# Patient Record
Sex: Male | Born: 1935 | Race: White | Hispanic: No | Smoking: Never smoker
Health system: Southern US, Community
[De-identification: ages and names within clinical notes are randomized; demographics above are authoritative.]

## PROBLEM LIST (undated history)

## (undated) DIAGNOSIS — I639 Cerebral infarction, unspecified: Secondary | ICD-10-CM

## (undated) DIAGNOSIS — E7800 Pure hypercholesterolemia, unspecified: Secondary | ICD-10-CM

## (undated) DIAGNOSIS — I4891 Unspecified atrial fibrillation: Secondary | ICD-10-CM

## (undated) DIAGNOSIS — K219 Gastro-esophageal reflux disease without esophagitis: Secondary | ICD-10-CM

## (undated) HISTORY — PX: TOTAL HIP ARTHROPLASTY: SHX124

---

## 2004-05-31 ENCOUNTER — Ambulatory Visit: Payer: Self-pay | Admitting: General Surgery

## 2006-01-12 ENCOUNTER — Ambulatory Visit: Payer: Self-pay | Admitting: Internal Medicine

## 2009-04-14 ENCOUNTER — Ambulatory Visit: Payer: Self-pay | Admitting: Internal Medicine

## 2009-04-15 ENCOUNTER — Ambulatory Visit: Payer: Self-pay | Admitting: Vascular Surgery

## 2009-04-21 ENCOUNTER — Ambulatory Visit: Payer: Self-pay | Admitting: Vascular Surgery

## 2009-04-22 ENCOUNTER — Ambulatory Visit: Payer: Self-pay | Admitting: Cardiovascular Disease

## 2009-04-22 ENCOUNTER — Inpatient Hospital Stay: Payer: Self-pay | Admitting: Vascular Surgery

## 2010-01-30 ENCOUNTER — Inpatient Hospital Stay: Payer: Self-pay | Admitting: Internal Medicine

## 2011-09-04 DIAGNOSIS — S32009A Unspecified fracture of unspecified lumbar vertebra, initial encounter for closed fracture: Secondary | ICD-10-CM | POA: Diagnosis not present

## 2011-09-04 DIAGNOSIS — M76899 Other specified enthesopathies of unspecified lower limb, excluding foot: Secondary | ICD-10-CM | POA: Diagnosis not present

## 2011-09-04 DIAGNOSIS — M1610 Unilateral primary osteoarthritis, unspecified hip: Secondary | ICD-10-CM | POA: Diagnosis not present

## 2011-09-13 DIAGNOSIS — Z125 Encounter for screening for malignant neoplasm of prostate: Secondary | ICD-10-CM | POA: Diagnosis not present

## 2011-09-13 DIAGNOSIS — E785 Hyperlipidemia, unspecified: Secondary | ICD-10-CM | POA: Diagnosis not present

## 2011-09-13 DIAGNOSIS — N400 Enlarged prostate without lower urinary tract symptoms: Secondary | ICD-10-CM | POA: Diagnosis not present

## 2011-09-27 DIAGNOSIS — S32009A Unspecified fracture of unspecified lumbar vertebra, initial encounter for closed fracture: Secondary | ICD-10-CM | POA: Diagnosis not present

## 2011-09-27 DIAGNOSIS — M1610 Unilateral primary osteoarthritis, unspecified hip: Secondary | ICD-10-CM | POA: Diagnosis not present

## 2011-09-27 DIAGNOSIS — M76899 Other specified enthesopathies of unspecified lower limb, excluding foot: Secondary | ICD-10-CM | POA: Diagnosis not present

## 2011-10-26 DIAGNOSIS — M1610 Unilateral primary osteoarthritis, unspecified hip: Secondary | ICD-10-CM | POA: Diagnosis not present

## 2011-10-26 DIAGNOSIS — M76899 Other specified enthesopathies of unspecified lower limb, excluding foot: Secondary | ICD-10-CM | POA: Diagnosis not present

## 2011-10-26 DIAGNOSIS — S32009A Unspecified fracture of unspecified lumbar vertebra, initial encounter for closed fracture: Secondary | ICD-10-CM | POA: Diagnosis not present

## 2012-03-29 DIAGNOSIS — E785 Hyperlipidemia, unspecified: Secondary | ICD-10-CM | POA: Diagnosis not present

## 2012-03-29 DIAGNOSIS — I4891 Unspecified atrial fibrillation: Secondary | ICD-10-CM | POA: Diagnosis not present

## 2012-04-03 DIAGNOSIS — M1990 Unspecified osteoarthritis, unspecified site: Secondary | ICD-10-CM | POA: Diagnosis not present

## 2012-04-03 DIAGNOSIS — E785 Hyperlipidemia, unspecified: Secondary | ICD-10-CM | POA: Diagnosis not present

## 2012-04-03 DIAGNOSIS — I739 Peripheral vascular disease, unspecified: Secondary | ICD-10-CM | POA: Diagnosis not present

## 2012-05-03 DIAGNOSIS — T797XXA Traumatic subcutaneous emphysema, initial encounter: Secondary | ICD-10-CM | POA: Diagnosis not present

## 2012-05-03 DIAGNOSIS — S2249XA Multiple fractures of ribs, unspecified side, initial encounter for closed fracture: Secondary | ICD-10-CM | POA: Diagnosis not present

## 2012-05-03 DIAGNOSIS — I609 Nontraumatic subarachnoid hemorrhage, unspecified: Secondary | ICD-10-CM | POA: Diagnosis not present

## 2012-05-03 DIAGNOSIS — S270XXA Traumatic pneumothorax, initial encounter: Secondary | ICD-10-CM | POA: Diagnosis not present

## 2012-05-03 DIAGNOSIS — S43109A Unspecified dislocation of unspecified acromioclavicular joint, initial encounter: Secondary | ICD-10-CM | POA: Diagnosis not present

## 2012-05-03 DIAGNOSIS — S52009A Unspecified fracture of upper end of unspecified ulna, initial encounter for closed fracture: Secondary | ICD-10-CM | POA: Diagnosis not present

## 2012-05-03 DIAGNOSIS — M546 Pain in thoracic spine: Secondary | ICD-10-CM | POA: Diagnosis not present

## 2012-05-03 DIAGNOSIS — I509 Heart failure, unspecified: Secondary | ICD-10-CM | POA: Diagnosis not present

## 2012-05-03 DIAGNOSIS — S2239XA Fracture of one rib, unspecified side, initial encounter for closed fracture: Secondary | ICD-10-CM | POA: Diagnosis not present

## 2012-05-03 DIAGNOSIS — S99929A Unspecified injury of unspecified foot, initial encounter: Secondary | ICD-10-CM | POA: Diagnosis not present

## 2012-05-03 DIAGNOSIS — J9819 Other pulmonary collapse: Secondary | ICD-10-CM | POA: Diagnosis not present

## 2012-05-03 DIAGNOSIS — I495 Sick sinus syndrome: Secondary | ICD-10-CM | POA: Diagnosis not present

## 2012-05-03 DIAGNOSIS — H9190 Unspecified hearing loss, unspecified ear: Secondary | ICD-10-CM | POA: Diagnosis present

## 2012-05-03 DIAGNOSIS — S4980XA Other specified injuries of shoulder and upper arm, unspecified arm, initial encounter: Secondary | ICD-10-CM | POA: Diagnosis not present

## 2012-05-03 DIAGNOSIS — Z7982 Long term (current) use of aspirin: Secondary | ICD-10-CM | POA: Diagnosis not present

## 2012-05-03 DIAGNOSIS — S42309A Unspecified fracture of shaft of humerus, unspecified arm, initial encounter for closed fracture: Secondary | ICD-10-CM | POA: Diagnosis not present

## 2012-05-03 DIAGNOSIS — Z4682 Encounter for fitting and adjustment of non-vascular catheter: Secondary | ICD-10-CM | POA: Diagnosis not present

## 2012-05-03 DIAGNOSIS — Z79899 Other long term (current) drug therapy: Secondary | ICD-10-CM | POA: Diagnosis not present

## 2012-05-03 DIAGNOSIS — H918X9 Other specified hearing loss, unspecified ear: Secondary | ICD-10-CM | POA: Diagnosis not present

## 2012-05-03 DIAGNOSIS — IMO0002 Reserved for concepts with insufficient information to code with codable children: Secondary | ICD-10-CM | POA: Diagnosis not present

## 2012-05-03 DIAGNOSIS — H7200 Central perforation of tympanic membrane, unspecified ear: Secondary | ICD-10-CM | POA: Diagnosis not present

## 2012-05-03 DIAGNOSIS — E7800 Pure hypercholesterolemia, unspecified: Secondary | ICD-10-CM | POA: Diagnosis present

## 2012-05-03 DIAGNOSIS — S298XXA Other specified injuries of thorax, initial encounter: Secondary | ICD-10-CM | POA: Diagnosis not present

## 2012-05-03 DIAGNOSIS — S066X9A Traumatic subarachnoid hemorrhage with loss of consciousness of unspecified duration, initial encounter: Secondary | ICD-10-CM | POA: Diagnosis present

## 2012-05-03 DIAGNOSIS — E785 Hyperlipidemia, unspecified: Secondary | ICD-10-CM | POA: Diagnosis present

## 2012-05-03 DIAGNOSIS — H748X9 Other specified disorders of middle ear and mastoid, unspecified ear: Secondary | ICD-10-CM | POA: Diagnosis present

## 2012-05-03 DIAGNOSIS — G8911 Acute pain due to trauma: Secondary | ICD-10-CM | POA: Diagnosis present

## 2012-05-03 DIAGNOSIS — S0003XA Contusion of scalp, initial encounter: Secondary | ICD-10-CM | POA: Diagnosis not present

## 2012-05-03 DIAGNOSIS — R918 Other nonspecific abnormal finding of lung field: Secondary | ICD-10-CM | POA: Diagnosis not present

## 2012-05-03 DIAGNOSIS — J9383 Other pneumothorax: Secondary | ICD-10-CM | POA: Diagnosis not present

## 2012-05-03 DIAGNOSIS — H902 Conductive hearing loss, unspecified: Secondary | ICD-10-CM | POA: Diagnosis not present

## 2012-05-03 DIAGNOSIS — Z96659 Presence of unspecified artificial knee joint: Secondary | ICD-10-CM | POA: Diagnosis not present

## 2012-05-03 DIAGNOSIS — Z96649 Presence of unspecified artificial hip joint: Secondary | ICD-10-CM | POA: Diagnosis not present

## 2012-05-03 DIAGNOSIS — T148XXA Other injury of unspecified body region, initial encounter: Secondary | ICD-10-CM | POA: Diagnosis not present

## 2012-05-03 DIAGNOSIS — S01309A Unspecified open wound of unspecified ear, initial encounter: Secondary | ICD-10-CM | POA: Diagnosis present

## 2012-05-10 DIAGNOSIS — R071 Chest pain on breathing: Secondary | ICD-10-CM | POA: Diagnosis not present

## 2012-05-10 DIAGNOSIS — J95811 Postprocedural pneumothorax: Secondary | ICD-10-CM | POA: Diagnosis not present

## 2012-05-10 DIAGNOSIS — I609 Nontraumatic subarachnoid hemorrhage, unspecified: Secondary | ICD-10-CM | POA: Diagnosis not present

## 2012-05-13 DIAGNOSIS — H7290 Unspecified perforation of tympanic membrane, unspecified ear: Secondary | ICD-10-CM | POA: Diagnosis not present

## 2012-05-13 DIAGNOSIS — H902 Conductive hearing loss, unspecified: Secondary | ICD-10-CM | POA: Diagnosis not present

## 2012-05-13 DIAGNOSIS — H905 Unspecified sensorineural hearing loss: Secondary | ICD-10-CM | POA: Diagnosis not present

## 2012-05-13 DIAGNOSIS — H6120 Impacted cerumen, unspecified ear: Secondary | ICD-10-CM | POA: Diagnosis not present

## 2012-05-13 DIAGNOSIS — H66009 Acute suppurative otitis media without spontaneous rupture of ear drum, unspecified ear: Secondary | ICD-10-CM | POA: Diagnosis not present

## 2012-05-16 DIAGNOSIS — G459 Transient cerebral ischemic attack, unspecified: Secondary | ICD-10-CM | POA: Diagnosis not present

## 2012-05-16 DIAGNOSIS — E785 Hyperlipidemia, unspecified: Secondary | ICD-10-CM | POA: Diagnosis not present

## 2012-05-16 DIAGNOSIS — I6350 Cerebral infarction due to unspecified occlusion or stenosis of unspecified cerebral artery: Secondary | ICD-10-CM | POA: Diagnosis not present

## 2012-05-16 DIAGNOSIS — T798XXA Other early complications of trauma, initial encounter: Secondary | ICD-10-CM | POA: Diagnosis not present

## 2012-05-16 DIAGNOSIS — M25519 Pain in unspecified shoulder: Secondary | ICD-10-CM | POA: Diagnosis not present

## 2012-05-16 LAB — CK TOTAL AND CKMB (NOT AT ARMC)
CK, Total: 92 U/L (ref 35–232)
CK-MB: 2.7 ng/mL (ref 0.5–3.6)

## 2012-05-16 LAB — COMPREHENSIVE METABOLIC PANEL
Alkaline Phosphatase: 126 U/L (ref 50–136)
BUN: 16 mg/dL (ref 7–18)
Bilirubin,Total: 0.6 mg/dL (ref 0.2–1.0)
Chloride: 106 mmol/L (ref 98–107)
Co2: 23 mmol/L (ref 21–32)
Creatinine: 0.94 mg/dL (ref 0.60–1.30)
Osmolality: 283 (ref 275–301)
SGOT(AST): 47 U/L — ABNORMAL HIGH (ref 15–37)
SGPT (ALT): 88 U/L — ABNORMAL HIGH (ref 12–78)

## 2012-05-16 LAB — URINALYSIS, COMPLETE
Bacteria: NONE SEEN
Bilirubin,UR: NEGATIVE
Blood: NEGATIVE
Glucose,UR: NEGATIVE mg/dL (ref 0–75)
Ketone: NEGATIVE
Nitrite: NEGATIVE
Ph: 7 (ref 4.5–8.0)
Protein: NEGATIVE
Specific Gravity: 1.005 (ref 1.003–1.030)
Squamous Epithelial: <1

## 2012-05-16 LAB — CBC
HCT: 39.3 % — ABNORMAL LOW (ref 40.0–52.0)
HGB: 13.4 g/dL (ref 13.0–18.0)
MCV: 90 fL (ref 80–100)
RDW: 13.5 % (ref 11.5–14.5)
WBC: 7.5 10*3/uL (ref 3.8–10.6)

## 2012-05-16 LAB — TROPONIN I: Troponin-I: <0.02 ng/mL

## 2012-05-17 ENCOUNTER — Inpatient Hospital Stay: Payer: Self-pay | Admitting: Internal Medicine

## 2012-05-17 DIAGNOSIS — Z7902 Long term (current) use of antithrombotics/antiplatelets: Secondary | ICD-10-CM | POA: Diagnosis not present

## 2012-05-17 DIAGNOSIS — Z1389 Encounter for screening for other disorder: Secondary | ICD-10-CM | POA: Diagnosis not present

## 2012-05-17 DIAGNOSIS — G459 Transient cerebral ischemic attack, unspecified: Secondary | ICD-10-CM | POA: Diagnosis not present

## 2012-05-17 DIAGNOSIS — S060X0A Concussion without loss of consciousness, initial encounter: Secondary | ICD-10-CM | POA: Diagnosis not present

## 2012-05-17 DIAGNOSIS — T798XXA Other early complications of trauma, initial encounter: Secondary | ICD-10-CM | POA: Diagnosis not present

## 2012-05-17 DIAGNOSIS — M25519 Pain in unspecified shoulder: Secondary | ICD-10-CM | POA: Diagnosis not present

## 2012-05-17 DIAGNOSIS — I6350 Cerebral infarction due to unspecified occlusion or stenosis of unspecified cerebral artery: Secondary | ICD-10-CM | POA: Diagnosis not present

## 2012-05-17 DIAGNOSIS — I70209 Unspecified atherosclerosis of native arteries of extremities, unspecified extremity: Secondary | ICD-10-CM | POA: Diagnosis present

## 2012-05-17 DIAGNOSIS — E785 Hyperlipidemia, unspecified: Secondary | ICD-10-CM | POA: Diagnosis not present

## 2012-05-17 DIAGNOSIS — Z7982 Long term (current) use of aspirin: Secondary | ICD-10-CM | POA: Diagnosis not present

## 2012-05-17 DIAGNOSIS — Z87820 Personal history of traumatic brain injury: Secondary | ICD-10-CM | POA: Diagnosis not present

## 2012-05-17 DIAGNOSIS — R4789 Other speech disturbances: Secondary | ICD-10-CM | POA: Diagnosis present

## 2012-05-17 DIAGNOSIS — Z8249 Family history of ischemic heart disease and other diseases of the circulatory system: Secondary | ICD-10-CM | POA: Diagnosis not present

## 2012-05-17 DIAGNOSIS — I6529 Occlusion and stenosis of unspecified carotid artery: Secondary | ICD-10-CM | POA: Diagnosis not present

## 2012-05-17 DIAGNOSIS — I6340 Cerebral infarction due to embolism of unspecified cerebral artery: Secondary | ICD-10-CM | POA: Diagnosis not present

## 2012-05-17 DIAGNOSIS — I2510 Atherosclerotic heart disease of native coronary artery without angina pectoris: Secondary | ICD-10-CM | POA: Diagnosis not present

## 2012-05-20 DIAGNOSIS — I6789 Other cerebrovascular disease: Secondary | ICD-10-CM | POA: Diagnosis not present

## 2012-05-20 DIAGNOSIS — G459 Transient cerebral ischemic attack, unspecified: Secondary | ICD-10-CM | POA: Diagnosis not present

## 2012-05-23 DIAGNOSIS — Z8673 Personal history of transient ischemic attack (TIA), and cerebral infarction without residual deficits: Secondary | ICD-10-CM | POA: Diagnosis not present

## 2012-05-23 DIAGNOSIS — H9190 Unspecified hearing loss, unspecified ear: Secondary | ICD-10-CM | POA: Diagnosis not present

## 2012-05-27 DIAGNOSIS — H908 Mixed conductive and sensorineural hearing loss, unspecified: Secondary | ICD-10-CM | POA: Diagnosis not present

## 2012-05-27 DIAGNOSIS — H6980 Other specified disorders of Eustachian tube, unspecified ear: Secondary | ICD-10-CM | POA: Diagnosis not present

## 2012-05-29 DIAGNOSIS — M719 Bursopathy, unspecified: Secondary | ICD-10-CM | POA: Diagnosis not present

## 2012-05-29 DIAGNOSIS — M75100 Unspecified rotator cuff tear or rupture of unspecified shoulder, not specified as traumatic: Secondary | ICD-10-CM | POA: Diagnosis not present

## 2012-05-29 DIAGNOSIS — M67919 Unspecified disorder of synovium and tendon, unspecified shoulder: Secondary | ICD-10-CM | POA: Diagnosis not present

## 2012-05-31 DIAGNOSIS — R4182 Altered mental status, unspecified: Secondary | ICD-10-CM | POA: Diagnosis not present

## 2012-05-31 DIAGNOSIS — R569 Unspecified convulsions: Secondary | ICD-10-CM | POA: Diagnosis not present

## 2012-06-10 DIAGNOSIS — F411 Generalized anxiety disorder: Secondary | ICD-10-CM | POA: Diagnosis not present

## 2012-06-10 DIAGNOSIS — G459 Transient cerebral ischemic attack, unspecified: Secondary | ICD-10-CM | POA: Diagnosis not present

## 2012-06-10 DIAGNOSIS — E785 Hyperlipidemia, unspecified: Secondary | ICD-10-CM | POA: Diagnosis not present

## 2012-06-17 DIAGNOSIS — H908 Mixed conductive and sensorineural hearing loss, unspecified: Secondary | ICD-10-CM | POA: Diagnosis not present

## 2012-06-17 DIAGNOSIS — H6980 Other specified disorders of Eustachian tube, unspecified ear: Secondary | ICD-10-CM | POA: Diagnosis not present

## 2012-06-17 DIAGNOSIS — H6500 Acute serous otitis media, unspecified ear: Secondary | ICD-10-CM | POA: Diagnosis not present

## 2012-06-26 DIAGNOSIS — H5316 Psychophysical visual disturbances: Secondary | ICD-10-CM | POA: Diagnosis not present

## 2012-06-26 DIAGNOSIS — F29 Unspecified psychosis not due to a substance or known physiological condition: Secondary | ICD-10-CM | POA: Diagnosis not present

## 2012-06-26 DIAGNOSIS — G4733 Obstructive sleep apnea (adult) (pediatric): Secondary | ICD-10-CM | POA: Diagnosis not present

## 2012-07-01 ENCOUNTER — Ambulatory Visit: Payer: Self-pay | Admitting: Neurology

## 2012-07-01 DIAGNOSIS — R4182 Altered mental status, unspecified: Secondary | ICD-10-CM | POA: Diagnosis not present

## 2012-07-01 DIAGNOSIS — Z8673 Personal history of transient ischemic attack (TIA), and cerebral infarction without residual deficits: Secondary | ICD-10-CM | POA: Diagnosis not present

## 2012-07-01 DIAGNOSIS — R569 Unspecified convulsions: Secondary | ICD-10-CM | POA: Diagnosis not present

## 2012-07-08 ENCOUNTER — Ambulatory Visit: Payer: Self-pay | Admitting: Neurology

## 2012-07-08 DIAGNOSIS — Z8673 Personal history of transient ischemic attack (TIA), and cerebral infarction without residual deficits: Secondary | ICD-10-CM | POA: Diagnosis not present

## 2012-07-08 DIAGNOSIS — R930 Abnormal findings on diagnostic imaging of skull and head, not elsewhere classified: Secondary | ICD-10-CM | POA: Diagnosis not present

## 2012-07-08 DIAGNOSIS — F29 Unspecified psychosis not due to a substance or known physiological condition: Secondary | ICD-10-CM | POA: Diagnosis not present

## 2012-07-08 LAB — CREATININE, SERUM
Creatinine: 0.94 mg/dL (ref 0.60–1.30)
EGFR (African American): >60
EGFR (Non-African Amer.): >60

## 2012-07-16 DIAGNOSIS — I619 Nontraumatic intracerebral hemorrhage, unspecified: Secondary | ICD-10-CM | POA: Diagnosis not present

## 2012-07-29 DIAGNOSIS — H6500 Acute serous otitis media, unspecified ear: Secondary | ICD-10-CM | POA: Diagnosis not present

## 2012-07-29 DIAGNOSIS — H908 Mixed conductive and sensorineural hearing loss, unspecified: Secondary | ICD-10-CM | POA: Diagnosis not present

## 2012-07-29 DIAGNOSIS — H6980 Other specified disorders of Eustachian tube, unspecified ear: Secondary | ICD-10-CM | POA: Diagnosis not present

## 2012-10-02 DIAGNOSIS — I6330 Cerebral infarction due to thrombosis of unspecified cerebral artery: Secondary | ICD-10-CM | POA: Diagnosis not present

## 2012-10-04 DIAGNOSIS — M1990 Unspecified osteoarthritis, unspecified site: Secondary | ICD-10-CM | POA: Diagnosis not present

## 2012-10-04 DIAGNOSIS — Z125 Encounter for screening for malignant neoplasm of prostate: Secondary | ICD-10-CM | POA: Diagnosis not present

## 2012-10-08 DIAGNOSIS — M1990 Unspecified osteoarthritis, unspecified site: Secondary | ICD-10-CM | POA: Diagnosis not present

## 2012-11-19 DIAGNOSIS — H2510 Age-related nuclear cataract, unspecified eye: Secondary | ICD-10-CM | POA: Diagnosis not present

## 2013-04-16 DIAGNOSIS — R7309 Other abnormal glucose: Secondary | ICD-10-CM | POA: Diagnosis not present

## 2013-04-16 DIAGNOSIS — E785 Hyperlipidemia, unspecified: Secondary | ICD-10-CM | POA: Diagnosis not present

## 2013-04-23 DIAGNOSIS — E785 Hyperlipidemia, unspecified: Secondary | ICD-10-CM | POA: Diagnosis not present

## 2013-04-23 DIAGNOSIS — I739 Peripheral vascular disease, unspecified: Secondary | ICD-10-CM | POA: Diagnosis not present

## 2013-04-23 DIAGNOSIS — M1990 Unspecified osteoarthritis, unspecified site: Secondary | ICD-10-CM | POA: Diagnosis not present

## 2013-06-27 ENCOUNTER — Emergency Department: Payer: Self-pay | Admitting: Emergency Medicine

## 2013-06-27 DIAGNOSIS — T7840XA Allergy, unspecified, initial encounter: Secondary | ICD-10-CM | POA: Diagnosis not present

## 2013-06-27 DIAGNOSIS — N400 Enlarged prostate without lower urinary tract symptoms: Secondary | ICD-10-CM | POA: Diagnosis not present

## 2013-06-27 DIAGNOSIS — R197 Diarrhea, unspecified: Secondary | ICD-10-CM | POA: Diagnosis not present

## 2013-06-27 DIAGNOSIS — R9431 Abnormal electrocardiogram [ECG] [EKG]: Secondary | ICD-10-CM | POA: Diagnosis not present

## 2013-06-27 DIAGNOSIS — Z79899 Other long term (current) drug therapy: Secondary | ICD-10-CM | POA: Diagnosis not present

## 2013-06-27 LAB — CBC WITH DIFFERENTIAL/PLATELET
Basophil #: 0 10*3/uL (ref 0.0–0.1)
Basophil %: 0.2 %
Eosinophil #: 0.1 10*3/uL (ref 0.0–0.7)
Eosinophil %: 0.7 %
HCT: 42.8 % (ref 40.0–52.0)
Lymphocyte #: 1.4 10*3/uL (ref 1.0–3.6)
MCH: 31 pg (ref 26.0–34.0)
MCV: 91 fL (ref 80–100)
Monocyte #: 0.7 x10 3/mm (ref 0.2–1.0)
Monocyte %: 6 %
Neutrophil #: 9.3 10*3/uL — ABNORMAL HIGH (ref 1.4–6.5)
Neutrophil %: 80.6 %
RBC: 4.71 10*6/uL (ref 4.40–5.90)
RDW: 14 % (ref 11.5–14.5)

## 2013-06-27 LAB — COMPREHENSIVE METABOLIC PANEL
Alkaline Phosphatase: 90 U/L (ref 50–136)
Anion Gap: 6 — ABNORMAL LOW (ref 7–16)
BUN: 29 mg/dL — ABNORMAL HIGH (ref 7–18)
Calcium, Total: 9 mg/dL (ref 8.5–10.1)
Chloride: 108 mmol/L — ABNORMAL HIGH (ref 98–107)
Co2: 26 mmol/L (ref 21–32)
Creatinine: 1.35 mg/dL — ABNORMAL HIGH (ref 0.60–1.30)
EGFR (African American): 59 — ABNORMAL LOW
EGFR (Non-African Amer.): 51 — ABNORMAL LOW
Potassium: 3.9 mmol/L (ref 3.5–5.1)
SGOT(AST): 29 U/L (ref 15–37)
Sodium: 140 mmol/L (ref 136–145)
Total Protein: 7.4 g/dL (ref 6.4–8.2)

## 2013-06-27 LAB — TROPONIN I: Troponin-I: <0.02 ng/mL

## 2013-09-12 DIAGNOSIS — M75100 Unspecified rotator cuff tear or rupture of unspecified shoulder, not specified as traumatic: Secondary | ICD-10-CM | POA: Diagnosis not present

## 2013-09-12 DIAGNOSIS — IMO0002 Reserved for concepts with insufficient information to code with codable children: Secondary | ICD-10-CM | POA: Diagnosis not present

## 2013-09-12 DIAGNOSIS — M67919 Unspecified disorder of synovium and tendon, unspecified shoulder: Secondary | ICD-10-CM | POA: Diagnosis not present

## 2013-09-12 DIAGNOSIS — M719 Bursopathy, unspecified: Secondary | ICD-10-CM | POA: Diagnosis not present

## 2013-10-22 DIAGNOSIS — E785 Hyperlipidemia, unspecified: Secondary | ICD-10-CM | POA: Diagnosis not present

## 2013-10-29 DIAGNOSIS — M1990 Unspecified osteoarthritis, unspecified site: Secondary | ICD-10-CM | POA: Diagnosis not present

## 2013-10-29 DIAGNOSIS — I10 Essential (primary) hypertension: Secondary | ICD-10-CM | POA: Diagnosis not present

## 2014-01-09 DIAGNOSIS — M76899 Other specified enthesopathies of unspecified lower limb, excluding foot: Secondary | ICD-10-CM | POA: Diagnosis not present

## 2014-09-14 ENCOUNTER — Ambulatory Visit: Payer: Self-pay | Admitting: Family Medicine

## 2014-09-14 DIAGNOSIS — S20212A Contusion of left front wall of thorax, initial encounter: Secondary | ICD-10-CM | POA: Diagnosis not present

## 2014-09-14 DIAGNOSIS — Y999 Unspecified external cause status: Secondary | ICD-10-CM | POA: Diagnosis not present

## 2014-09-14 DIAGNOSIS — S299XXA Unspecified injury of thorax, initial encounter: Secondary | ICD-10-CM | POA: Diagnosis not present

## 2014-09-14 DIAGNOSIS — R0789 Other chest pain: Secondary | ICD-10-CM | POA: Diagnosis not present

## 2014-09-14 DIAGNOSIS — Y929 Unspecified place or not applicable: Secondary | ICD-10-CM | POA: Diagnosis not present

## 2014-09-14 DIAGNOSIS — Y939 Activity, unspecified: Secondary | ICD-10-CM | POA: Diagnosis not present

## 2014-09-22 ENCOUNTER — Ambulatory Visit: Payer: Self-pay | Admitting: Family Medicine

## 2014-09-22 DIAGNOSIS — S20212S Contusion of left front wall of thorax, sequela: Secondary | ICD-10-CM | POA: Diagnosis not present

## 2014-09-22 DIAGNOSIS — S2232XS Fracture of one rib, left side, sequela: Secondary | ICD-10-CM | POA: Diagnosis not present

## 2014-09-22 DIAGNOSIS — S299XXA Unspecified injury of thorax, initial encounter: Secondary | ICD-10-CM | POA: Diagnosis not present

## 2014-09-22 DIAGNOSIS — Z96641 Presence of right artificial hip joint: Secondary | ICD-10-CM | POA: Diagnosis not present

## 2014-09-22 DIAGNOSIS — R079 Chest pain, unspecified: Secondary | ICD-10-CM | POA: Diagnosis not present

## 2014-09-22 DIAGNOSIS — K219 Gastro-esophageal reflux disease without esophagitis: Secondary | ICD-10-CM | POA: Diagnosis not present

## 2014-09-22 DIAGNOSIS — S279XXS Injury of unspecified intrathoracic organ, sequela: Secondary | ICD-10-CM | POA: Diagnosis not present

## 2014-09-29 DIAGNOSIS — H2513 Age-related nuclear cataract, bilateral: Secondary | ICD-10-CM | POA: Diagnosis not present

## 2014-10-20 DIAGNOSIS — H43811 Vitreous degeneration, right eye: Secondary | ICD-10-CM | POA: Diagnosis not present

## 2014-11-24 NOTE — Discharge Summary (Signed)
PATIENT NAME:  [name], [name] MR#:  [id number] DATE OF BIRTH:  12/07/1933  DATE OF ADMISSION:  05/17/2012 DATE OF DISCHARGE:  05/18/2012  PRIMARY CARE PHYSICIAN: Dewaine Oatsenny Tate, MD  DISCHARGE DIAGNOSES:  1. Right frontal subacute stroke  2. Hyperlipidemia.  3. History of left carotid endarterectomy in 2010.   CONSULTANTS: Cristopher PeruHemang Shah, MD - Neurology.   IMAGING STUDIES: MRI of the brain showed subacute lacunar infarction within the right frontal lobe along with mastoiditis.   Ultrasound of the carotids showed no significant stenosis.   Echocardiogram is pending.   ADMITTING HISTORY AND PHYSICAL: Please see detailed history and physical dictated on 05/16/2012. In brief, this is a 81 year old Caucasian gentleman with history of peripheral vascular disease and left carotid artery stenosis who presented to the Emergency Room complaining of slurred or garbled speech for 5 to 7 minutes at lunch. The patient had stopped his aspirin and Plavix since he had a head bleed two weeks prior. CT scan of the head was normal. The patient was admitted for further work-up of CVA.   HOSPITAL COURSE: Slurred speech. The patient's slurred speech had resolved by the time he presented to the Emergency Room. He had a MRI of the brain which showed right frontal subacute lacunar infarction. Dr. Sherryll BurgerShah of neurology was consulted who suggested continuing 325 mg of aspirin at home along with a statin. The patient did not have any hypertension. He did not need any physical therapy or speech services during the hospital stay with his symptoms resolved. The patient's carotid Dopplers have showed no significant stenosis. Echocardiogram is pending at this time. After the patient's echocardiogram results, he will be discharged home if Echo is normal.   On the day of discharge, the patient's temperature is 98.6, pulse 66, blood pressure 118/69, and he is being discharged home with normal neurological examination in a fair condition.    DISCHARGE MEDICATIONS: 1. Aspirin 325 mg once a day. 2. Fish oil 1 tablet oral once a day.  3. Multivitamin 1 tablet oral once a day.  4. Lipitor 20 mg oral once a day. 5. Multivitamin 1 tablet oral once a day.   DISCHARGE INSTRUCTIONS: The patient is to follow up with his primary care physician, Dr. Dewaine Oatsenny Tate, in a week. Continue aspirin and statin as prescribed. The patient has been advised to keep a low threshold for any further symptoms of focal weakness, speech problems, and facial droop and is to call his doctor or return to the Emergency Room if these symptoms happen. He will be on a cardiac diet with activity as tolerated. This plan was discussed with the patient who has verbalized understanding and is okay with the plan.   TIME SPENT: Time spent today on this discharge dictation along with coordinating care and counseling of the patient was 40 minutes. ____________________________ Molinda BailiffSrikar R. Channell Quattrone, MD srs:slb D: 05/18/2012 09:10:22 ET T: 05/18/2012 10:25:52 ET JOB#: [id number]  cc: Wardell HeathSrikar R. Oree Hislop, MD, <Dictator> Jillene Bucksenny C. Arlana Pouchate, MD Orie FishermanSRIKAR R Corvette Orser MD ELECTRONICALLY SIGNED 06/03/2012 13:57

## 2014-11-24 NOTE — Consult Note (Signed)
Brief Consult Note: Diagnosis: transient speech impairment - right posterior frontal infract (at least 2, left sided cortical infracts and WM changes).   Patient was seen by consultant.   Consult note dictated.   Comments: 1) Symptomatically had a TIA but MRI is consistant with very small infract involving only one gyrus in right posterior frontal region. Will call it "minor stroke" - he was not aware of having previous strokes but imaging is consistant with previous 2 cortical infracts involving left hemisphere (left mid temporal and larger left fronto-parietal). - Per pt and family he was only taking ASA 81 mg before this stroke. - Agree with stroke w/up, telemetry, carotid US, ECHO, lipid panel,  - Avoid aggressive BP reduction in peri-stroke period, - flu vaccine - Pt is back to his baseline. - Can go up to ASA 325 mg po qday. On statin. 2) Recent fall - scalp hematoma, right tympanic membrane rupture with loss of hearing and bleeding  from right ear -  - Pt had amnesia of the event and has post traumatic headache. - Likely has concussion.  Will follow.  Electronic Signatures: Jolene ProvostShah, Micah Galeno Kalpeshkumar (MD)  (Signed 11-Oct-13 21:22)  Authored: Brief Consult Note   Last Updated: 11-Oct-13 21:22 by Jolene ProvostShah, Atiyana Welte Kalpeshkumar (MD)

## 2014-11-24 NOTE — H&P (Signed)
PATIENT NAME:  [name], [name] MR#:  [id number] DATE OF BIRTH:  01-09-1934  DATE OF ADMISSION:  05/16/2012  PRIMARY CARE PHYSICIAN: Dewaine Oatsenny Tate, MD   PRESENTING COMPLAINT: Slurred speech.  HISTORY OF PRESENT ILLNESS: Mr. [name] is a 81 year old Caucasian gentleman who has history of peripheral vascular disease, history of left carotid artery stenosis, status post left CEA several years ago, history of coronary artery disease per cardiac catheterization in 2011 that showed minimal vessel disease, comes to the Emergency Room after he was found to have slurred/garbling speech for about 5 to 7 minutes at lunch when he was having it with his wife this afternoon at [employer]. The patient's speech has been normalized. The family has been noticing this has been going on and off for the past few days. He has been off aspirin and Plavix since his head bleed two weeks ago after he had a fall. CT of the head today looks normal. There is no evidence of any bleed, and it looks overall okay. He is being admitted for further evaluation and possible transient ischemic attack.   PAST MEDICAL HISTORY: 1. History of transient ischemic attacks in the past.  2. Left carotid endarterectomy. The patient was on aspirin and Plavix and has been off for two weeks.  3. History of fall two weeks ago, subsequently ended up having, from what the family tells, likely a subdural hematoma with bleed from his right ear and right ear tympanic membrane rupture currently, who was [facility] and transferred to a tertiary care center around [place] and kept in the hospital for 3 to 4 days. Repeat CT today does not show any evidence of bleed and appears to be normal. The patient has been off aspirin and Plavix for the last two weeks.  4. Hyperlipidemia.  5. Right ear injury after a fall two weeks ago, now on p.o. Augmentin and ear drops.  6. History of mini stroke in the past.   PAST SURGICAL HISTORY:  1. Left  carotid endarterectomy in 2010.  2. Left hip repair in 2005.   FAMILY HISTORY: No history of heart disease. One of his brothers has heart problems.   SOCIAL HISTORY: No history of smoking or alcohol intake.   CURRENT MEDICATIONS:  1. Augmentin antibiotic for ear infection.  2. Multivitamin p.o. daily.  3. Lipitor 20 mg p.o. daily.  4. Fish oil 1 tablet daily.  5. Aspirin 81 mg, the patient takes 2 tablets.  6. Plavix 75 mg daily, however, aspirin and Plavix have been on hold.    REVIEW OF SYSTEMS: CONSTITUTIONAL: No fever, fatigue, weakness. EYES: No blurred or double vision. ENT: No tinnitus. He has some temporary hearing loss and ear pain in the right side. RESPIRATORY: No cough, wheeze, hemoptysis, chronic obstructive pulmonary disease or dyspnea. CARDIOVASCULAR: No chest pain, orthopnea or edema. GASTROINTESTINAL: No nausea, vomiting, diarrhea, abdominal pain. GENITOURINARY: No dysuria or hematuria. ENDOCRINE: No polyuria or nocturia. HEMATOLOGIC: No anemia or easy bruising. SKIN: No acne or rash.  MUSCULOSKELETAL: Positive for arthritis. NEUROLOGICAL:  No cerebrovascular accident or  transient ischemic attack. PSYCHIATRIC: No anxiety or depression. All other systems reviewed and negative.   PHYSICAL EXAMINATION:  GENERAL: The patient is awake, alert and oriented x3, not in acute distress.   VITAL SIGNS: Afebrile, pulse is 63, blood pressure is 173/81. Saturations are 97% on air.   HEENT: Atraumatic, normocephalic. Pupils are equal, round and reactive to light and accommodation. Extraocular movements are intact. Oral mucosa is  moist.   NECK: Supple. No JVD. No carotid bruit.   LUNGS: Clear to auscultation bilaterally. No rales, rhonchi, respiratory distress, or labored breathing.   CARDIOVASCULAR: Both the heart sounds are normal. Rate, rhythm regular. PMI not lateralized.  Good pedal pulses, good femoral pulses. No lower extremity edema.   ABDOMEN: Soft, benign, nontender. No  organomegaly. Positive bowel sounds.  NEUROLOGICAL:  Grossly intact cranial nerves II through XII.  No motor or sensory deficit. No facial droop. Speech appears normal. Good strength, 5 out of 5 in all four extremities. Reflexes are 1+ both upper and lower extremities. Plantars are downgoing.   PSYCHIATRIC: The patient is awake, alert, oriented x3.   LABORATORY, DIAGNOSTIC AND RADIOLOGICAL DATA: EKG: Sinus rhythm. Urinalysis negative for urinary tract infection. CT of the head without contrast shows no acute intracranial process.  Mastoid air cells CBC within normal limits. Comprehensive metabolic panel within normal limits except glucose of 171. SGOT 47, SGPT 88. Troponin is 0.02.   ASSESSMENT/PLAN: Mr. [name] is a 81 year old with history of hyperlipidemia and carotid artery stenosis, being admitted for:   Suspected transient ischemic attack: The patient started having slurred/ garbling speech which cleared after 5 to 7 minutes. He had been off aspirin and Plavix since his head injury about two weeks ago. CT head today looks okay, no bleed. Hence, we will begin aspirin 81 mg daily.will get  MRI of the brain History of atrial fibrillation in the past: EKG today shows sinus rhythm.   The patient was on aspirin and Plavix and has been off two weeks since a fall and head bleed.  He will resume be resumed baclk on asa.  Hyperlipidemia. On Lipitor.  Right ear injury after a fall two weeks ago: The patient is on Augmentin and ear drops once the family brings them from home.   The patient will be admitted under observation for now.  Further work-up according to the patient's clinical course. The hospital admission plan was discussed with the patient and family members.   TIME SPENT: 50 minutes.  ____________________________ Wylie Hail Allena Katz, MD sap:cbb D: 05/16/2012 19:19:38 ET [name]: 05/17/2012 06:32:16 ET JOB#: [id number] cc: Jillene Bucks. Arlana Pouch, MD Willow Ora MD ELECTRONICALLY SIGNED 05/17/2012 21:00

## 2014-11-24 NOTE — Consult Note (Signed)
PATIENT NAME:  [name], [name] MR#:  [id number] DATE OF BIRTH:  02-18-1934  DATE OF CONSULTATION:  05/17/2012  REFERRING PHYSICIAN:  Enedina Finner, MD CONSULTING PHYSICIAN:  Ziah Leandro K. Sherryll Burger, MD  PRIMARY CARE PHYSICIAN: Dewaine Oats, MD  REASON FOR CONSULTATION: Stroke.   HISTORY OF PRESENT ILLNESS: [name] is a 81 year old Caucasian gentleman who had an acute onset of speech difficulty lasting for 5 to 7 minutes on 05/16/2012 in the afternoon when he was at [employer] with his friend.   This is very unusual for him. He denied any symptomatic stroke history.   His symptoms resolved but he still came to the ER to get checked out. Initial CT scan of the head was negative for new ischemic lesion, but on his MRI of the brain he does have a stroke on his right posterior frontal lobe involving only one gyrus.    The patient at baseline takes a baby aspirin a day (the admission physician seems to have mentioned aspirin plus Plavix, but per the patient's friend he only takes a baby aspirin a day.   The patient also had a fall around two weeks ago or so where he had a scalp hematoma and he ruptured his right tympanic membrane and had bleeding from his right ear. He has lost hearing from that ear.   The patient also developed significant posttraumatic headache since then. He had amnesia of the fall and a few hours afterwards he was admitted to a hospital and had a CT scan of the head which did not show any intracranial hemorrhage.   He has a history of carotid endarterectomy done on the left side.   The patient was not aware of stroke, but he does have some old strokes on his MRI of the brain, on the left side.   PAST MEDICAL HISTORY:  1. History of stroke.  2. History of fall two weeks ago, as described above. 3. Hyperlipidemia.   PAST SURGICAL HISTORY:  1. Left carotid endarterectomy in 2010. 2. Left hip repair in 2005.   FAMILY HISTORY: Positive for heart problems in a brother. His parents  are deceased.   SOCIAL HISTORY: History is significant in that he does not smoke, does not drink alcohol.   MEDICATIONS: I reviewed his home medication list.   REVIEW OF SYSTEMS: Positive for headache and inability to hear in his right ear which is new since a fall.   He has speech impediment. He has a scalp hematoma which is resolving. The rest of the review of systems was asked, 10 systems, and was unremarkable, except for what is in the History of Present Illness.   PHYSICAL EXAMINATION:   VITAL SIGNS: Temperature 97.9, pulse 69, respiratory rate 18, blood pressure 145/76, and pulse oximetry 96%.   GENERAL: He is an elderly looking Caucasian gentleman, bald, sitting in a chair next to his friend. He was asleep when I went into the room.   History was provided by his male friend.   The patient was easily arousable. He had some blood residue in his right ear.   The patient has some scalp bruise.   He was wearing glasses.   LUNGS: Clear to auscultation.   HEART: S1 and S2 heart sounds.  NECK: Carotid exam did not reveal any bruit. He does have a CEA scar on the left side of the neck.   NEUROLOGIC: It was difficult due to his profound hearing loss and I had to practically shout in his left ear.  He was alert and oriented. He followed two-step commands. He had made some mistakes, but I believe it was because of his hearing.  He was very humorous, he cracked some jokes.   He seemed to have no neglect. His attention and concentration and memory seems to be appropriate.   On his cranial nerves, his pupils were equal, round, and reactive. Extraocular movements were intact. His visual fields seemed full. His face was symmetric. Tongue was midline. Facial sensations were intact. He has profound hearing loss, right more than left.   On his motor examination, he has normal tone and strength of five out of five. His deep tendon reflexes were symmetric. His sensation to touch was  normal.   His gait was okay.   ASSESSMENT AND PLAN:  1. Stroke. Symptom-wise he had and a "TIA", but he has positive DWI changes on his MRI of the brain.   This supports a hypothesis that resolution of symptoms does not necessarily mean no ischemia to the cortical tissue.   I explained to him the  pathophysiology and progression of stroke, that the rest of the brain sometimes can take over the function of the ischemic tissue. As well as I explained to him the phenomenon of penumbra and fluctuation of the symptoms, etc.  I also explained to him that DWI lesion does not necessarily always mean that this is dead tissue but sometimes due to reperfusion and the patient may regain function of that area where DWI lesion usually suggests cytotoxic edema.   Based on his MRI, it seems like he had spontaneous reperfusion.   The patient also seemed to have previous two big infarcts, one was in the left middle temporal lobe and other was a bigger infarct in his left frontoparietal region.   At baseline he was only taking aspirin 81 mg. He should be taking aspirin 325 mg.  (It was documented that patient takes aspirin and Plavix, but I confirmed multiple times with the patient and his male friend that what antiepileptic medication he takes at baseline.)   The patient should be evaluated for embolic source. He had an echocardiogram which was unremarkable. He had telemetry monitoring. As outpatient he can be considered for Holter monitor for paroxysmal atrial fibrillation.   The patient is already on statin. Avoid drastically reducing blood pressure in peristroke.   Avoid hypothermia and hypoglycemia and hyperglycemia which is known to increase the penumbra size.  Flu shot and multivitamin have been related to reducing secondary stroke frequency.   Deep vein thrombosis prophylaxis.   The patient seems to be eager to go home.   2. Postconcussion syndrome. The patient seems to have had a significant  head injury around two weeks ago. He denied any local soreness to his neck region suggestive of dissection.   But if he has new focal neurological symptoms, he should be evaluated for dissection.   The patient and his family are aware that they should call 911 if he has any focal neurological deficit.   The patient was explained on postconcussion syndrome and the reason for amnesia of the event soon after fall.   He still has some headache that he can take over-the-counter medications for and I can see him in followup and see if he might benefit from any other headache medications.   Feel free to contact me with any further questions. I will follow this patient with you in the hospital. ____________________________ Daurice Ovando K. Sherryll Burger, MD hks:slb D: 05/18/2012 11:29:00 ET T:  05/18/2012 11:47:33 ET JOB#: [id number]  cc: Prentis Langdon K. Sherryll BurgerShah, MD, <Dictator> Durene CalHEMANG K [facility] MD ELECTRONICALLY SIGNED 05/21/2012 7:05

## 2014-12-16 DIAGNOSIS — M9903 Segmental and somatic dysfunction of lumbar region: Secondary | ICD-10-CM | POA: Diagnosis not present

## 2014-12-16 DIAGNOSIS — M9901 Segmental and somatic dysfunction of cervical region: Secondary | ICD-10-CM | POA: Diagnosis not present

## 2014-12-16 DIAGNOSIS — M5136 Other intervertebral disc degeneration, lumbar region: Secondary | ICD-10-CM | POA: Diagnosis not present

## 2014-12-16 DIAGNOSIS — M5032 Other cervical disc degeneration, mid-cervical region: Secondary | ICD-10-CM | POA: Diagnosis not present

## 2014-12-16 DIAGNOSIS — M9902 Segmental and somatic dysfunction of thoracic region: Secondary | ICD-10-CM | POA: Diagnosis not present

## 2014-12-16 DIAGNOSIS — M5134 Other intervertebral disc degeneration, thoracic region: Secondary | ICD-10-CM | POA: Diagnosis not present

## 2014-12-17 DIAGNOSIS — M9901 Segmental and somatic dysfunction of cervical region: Secondary | ICD-10-CM | POA: Diagnosis not present

## 2014-12-17 DIAGNOSIS — M9902 Segmental and somatic dysfunction of thoracic region: Secondary | ICD-10-CM | POA: Diagnosis not present

## 2014-12-17 DIAGNOSIS — M5136 Other intervertebral disc degeneration, lumbar region: Secondary | ICD-10-CM | POA: Diagnosis not present

## 2014-12-17 DIAGNOSIS — M9903 Segmental and somatic dysfunction of lumbar region: Secondary | ICD-10-CM | POA: Diagnosis not present

## 2014-12-17 DIAGNOSIS — M5032 Other cervical disc degeneration, mid-cervical region: Secondary | ICD-10-CM | POA: Diagnosis not present

## 2014-12-17 DIAGNOSIS — M5134 Other intervertebral disc degeneration, thoracic region: Secondary | ICD-10-CM | POA: Diagnosis not present

## 2014-12-18 DIAGNOSIS — M9902 Segmental and somatic dysfunction of thoracic region: Secondary | ICD-10-CM | POA: Diagnosis not present

## 2014-12-18 DIAGNOSIS — M5134 Other intervertebral disc degeneration, thoracic region: Secondary | ICD-10-CM | POA: Diagnosis not present

## 2014-12-18 DIAGNOSIS — M5032 Other cervical disc degeneration, mid-cervical region: Secondary | ICD-10-CM | POA: Diagnosis not present

## 2014-12-18 DIAGNOSIS — M9903 Segmental and somatic dysfunction of lumbar region: Secondary | ICD-10-CM | POA: Diagnosis not present

## 2014-12-18 DIAGNOSIS — M5136 Other intervertebral disc degeneration, lumbar region: Secondary | ICD-10-CM | POA: Diagnosis not present

## 2014-12-18 DIAGNOSIS — M9901 Segmental and somatic dysfunction of cervical region: Secondary | ICD-10-CM | POA: Diagnosis not present

## 2014-12-21 DIAGNOSIS — M5032 Other cervical disc degeneration, mid-cervical region: Secondary | ICD-10-CM | POA: Diagnosis not present

## 2014-12-21 DIAGNOSIS — M5136 Other intervertebral disc degeneration, lumbar region: Secondary | ICD-10-CM | POA: Diagnosis not present

## 2014-12-21 DIAGNOSIS — M9902 Segmental and somatic dysfunction of thoracic region: Secondary | ICD-10-CM | POA: Diagnosis not present

## 2014-12-21 DIAGNOSIS — M9901 Segmental and somatic dysfunction of cervical region: Secondary | ICD-10-CM | POA: Diagnosis not present

## 2014-12-21 DIAGNOSIS — M5134 Other intervertebral disc degeneration, thoracic region: Secondary | ICD-10-CM | POA: Diagnosis not present

## 2014-12-21 DIAGNOSIS — M9903 Segmental and somatic dysfunction of lumbar region: Secondary | ICD-10-CM | POA: Diagnosis not present

## 2014-12-23 DIAGNOSIS — M9902 Segmental and somatic dysfunction of thoracic region: Secondary | ICD-10-CM | POA: Diagnosis not present

## 2014-12-23 DIAGNOSIS — M9903 Segmental and somatic dysfunction of lumbar region: Secondary | ICD-10-CM | POA: Diagnosis not present

## 2014-12-23 DIAGNOSIS — M9901 Segmental and somatic dysfunction of cervical region: Secondary | ICD-10-CM | POA: Diagnosis not present

## 2014-12-23 DIAGNOSIS — M5136 Other intervertebral disc degeneration, lumbar region: Secondary | ICD-10-CM | POA: Diagnosis not present

## 2014-12-23 DIAGNOSIS — M5134 Other intervertebral disc degeneration, thoracic region: Secondary | ICD-10-CM | POA: Diagnosis not present

## 2014-12-23 DIAGNOSIS — M5032 Other cervical disc degeneration, mid-cervical region: Secondary | ICD-10-CM | POA: Diagnosis not present

## 2014-12-25 DIAGNOSIS — M9902 Segmental and somatic dysfunction of thoracic region: Secondary | ICD-10-CM | POA: Diagnosis not present

## 2014-12-25 DIAGNOSIS — M5136 Other intervertebral disc degeneration, lumbar region: Secondary | ICD-10-CM | POA: Diagnosis not present

## 2014-12-25 DIAGNOSIS — M9903 Segmental and somatic dysfunction of lumbar region: Secondary | ICD-10-CM | POA: Diagnosis not present

## 2014-12-25 DIAGNOSIS — M5134 Other intervertebral disc degeneration, thoracic region: Secondary | ICD-10-CM | POA: Diagnosis not present

## 2014-12-25 DIAGNOSIS — M5032 Other cervical disc degeneration, mid-cervical region: Secondary | ICD-10-CM | POA: Diagnosis not present

## 2014-12-25 DIAGNOSIS — M9901 Segmental and somatic dysfunction of cervical region: Secondary | ICD-10-CM | POA: Diagnosis not present

## 2016-04-20 ENCOUNTER — Encounter: Payer: Self-pay | Admitting: Emergency Medicine

## 2016-04-20 ENCOUNTER — Emergency Department: Payer: Medicare Other

## 2016-04-20 ENCOUNTER — Observation Stay
Admission: EM | Admit: 2016-04-20 | Discharge: 2016-04-21 | Disposition: A | Discharge status: Home / Self Care | Payer: Medicare Other | Attending: Internal Medicine | Admitting: Internal Medicine

## 2016-04-20 ENCOUNTER — Ambulatory Visit (INDEPENDENT_AMBULATORY_CARE_PROVIDER_SITE_OTHER)
Admission: EM | Admit: 2016-04-20 | Discharge: 2016-04-20 | Disposition: A | Discharge status: Other Acute Inpatient Hospital | Payer: Medicare Other | Source: Home / Self Care

## 2016-04-20 DIAGNOSIS — R000 Tachycardia, unspecified: Secondary | ICD-10-CM

## 2016-04-20 DIAGNOSIS — Z7982 Long term (current) use of aspirin: Secondary | ICD-10-CM | POA: Insufficient documentation

## 2016-04-20 DIAGNOSIS — R531 Weakness: Secondary | ICD-10-CM | POA: Diagnosis not present

## 2016-04-20 DIAGNOSIS — N400 Enlarged prostate without lower urinary tract symptoms: Secondary | ICD-10-CM | POA: Insufficient documentation

## 2016-04-20 DIAGNOSIS — E785 Hyperlipidemia, unspecified: Secondary | ICD-10-CM | POA: Insufficient documentation

## 2016-04-20 DIAGNOSIS — I959 Hypotension, unspecified: Secondary | ICD-10-CM | POA: Insufficient documentation

## 2016-04-20 DIAGNOSIS — F419 Anxiety disorder, unspecified: Secondary | ICD-10-CM | POA: Insufficient documentation

## 2016-04-20 DIAGNOSIS — G9389 Other specified disorders of brain: Secondary | ICD-10-CM | POA: Diagnosis not present

## 2016-04-20 DIAGNOSIS — Z8673 Personal history of transient ischemic attack (TIA), and cerebral infarction without residual deficits: Secondary | ICD-10-CM | POA: Diagnosis not present

## 2016-04-20 DIAGNOSIS — R778 Other specified abnormalities of plasma proteins: Secondary | ICD-10-CM | POA: Diagnosis not present

## 2016-04-20 DIAGNOSIS — R7989 Other specified abnormal findings of blood chemistry: Secondary | ICD-10-CM | POA: Diagnosis not present

## 2016-04-20 DIAGNOSIS — E7800 Pure hypercholesterolemia, unspecified: Secondary | ICD-10-CM | POA: Diagnosis not present

## 2016-04-20 DIAGNOSIS — E876 Hypokalemia: Secondary | ICD-10-CM | POA: Insufficient documentation

## 2016-04-20 DIAGNOSIS — Z79899 Other long term (current) drug therapy: Secondary | ICD-10-CM | POA: Diagnosis not present

## 2016-04-20 DIAGNOSIS — R197 Diarrhea, unspecified: Secondary | ICD-10-CM

## 2016-04-20 DIAGNOSIS — I081 Rheumatic disorders of both mitral and tricuspid valves: Secondary | ICD-10-CM | POA: Insufficient documentation

## 2016-04-20 DIAGNOSIS — K219 Gastro-esophageal reflux disease without esophagitis: Secondary | ICD-10-CM | POA: Diagnosis not present

## 2016-04-20 DIAGNOSIS — H538 Other visual disturbances: Secondary | ICD-10-CM | POA: Diagnosis not present

## 2016-04-20 HISTORY — DX: Pure hypercholesterolemia, unspecified: E78.00

## 2016-04-20 HISTORY — DX: Gastro-esophageal reflux disease without esophagitis: K21.9

## 2016-04-20 HISTORY — DX: Cerebral infarction, unspecified: I63.9

## 2016-04-20 LAB — COMPREHENSIVE METABOLIC PANEL
ALBUMIN: 3.3 g/dL — AB (ref 3.5–5.0)
ALK PHOS: 71 U/L (ref 38–126)
ALT: 21 U/L (ref 17–63)
ANION GAP: 6 (ref 5–15)
AST: 28 U/L (ref 15–41)
BUN: 19 mg/dL (ref 6–20)
CO2: 25 mmol/L (ref 22–32)
Calcium: 8.3 mg/dL — ABNORMAL LOW (ref 8.9–10.3)
Chloride: 108 mmol/L (ref 101–111)
Creatinine, Ser: 0.97 mg/dL (ref 0.61–1.24)
GFR calc Af Amer: >60 mL/min (ref >60)
GFR calc non Af Amer: >60 mL/min (ref >60)
GLUCOSE: 108 mg/dL — AB (ref 65–99)
POTASSIUM: 3.1 mmol/L — AB (ref 3.5–5.1)
SODIUM: 139 mmol/L (ref 135–145)
Total Bilirubin: 0.4 mg/dL (ref 0.3–1.2)
Total Protein: 6.2 g/dL — ABNORMAL LOW (ref 6.5–8.1)

## 2016-04-20 LAB — CBC WITH DIFFERENTIAL/PLATELET
BASOS ABS: 0 10*3/uL (ref 0–0.1)
BASOS PCT: 0 %
EOS ABS: 0.1 10*3/uL (ref 0–0.7)
Eosinophils Relative: 3 %
HEMATOCRIT: 35.4 % — AB (ref 40.0–52.0)
HEMOGLOBIN: 12.3 g/dL — AB (ref 13.0–18.0)
Lymphocytes Relative: 25 %
Lymphs Abs: 1.4 10*3/uL (ref 1.0–3.6)
MCH: 30.2 pg (ref 26.0–34.0)
MCHC: 34.7 g/dL (ref 32.0–36.0)
MCV: 86.9 fL (ref 80.0–100.0)
MONOS PCT: 21 %
Monocytes Absolute: 1.2 10*3/uL — ABNORMAL HIGH (ref 0.2–1.0)
NEUTROS ABS: 2.8 10*3/uL (ref 1.4–6.5)
NEUTROS PCT: 51 %
Platelets: 196 10*3/uL (ref 150–440)
RBC: 4.07 MIL/uL — ABNORMAL LOW (ref 4.40–5.90)
RDW: 14.6 % — AB (ref 11.5–14.5)
WBC: 5.5 10*3/uL (ref 3.8–10.6)

## 2016-04-20 LAB — URINALYSIS COMPLETE WITH MICROSCOPIC (ARMC ONLY)
BACTERIA UA: NONE SEEN
Bilirubin Urine: NEGATIVE
Glucose, UA: NEGATIVE mg/dL
HGB URINE DIPSTICK: NEGATIVE
KETONES UR: NEGATIVE mg/dL
NITRITE: NEGATIVE
PH: 6 (ref 5.0–8.0)
PROTEIN: NEGATIVE mg/dL
RBC / HPF: NONE SEEN RBC/hpf (ref 0–5)
SPECIFIC GRAVITY, URINE: 1.012 (ref 1.005–1.030)

## 2016-04-20 LAB — TROPONIN I
TROPONIN I: 0.12 ng/mL — AB (ref <0.03)
Troponin I: 0.1 ng/mL (ref <0.03)
Troponin I: 0.12 ng/mL (ref <0.03)

## 2016-04-20 LAB — MAGNESIUM: Magnesium: 1.8 mg/dL (ref 1.7–2.4)

## 2016-04-20 MED ORDER — ASPIRIN EC 81 MG PO TBEC
81.0000 mg | DELAYED_RELEASE_TABLET | Freq: Every day | ORAL | Status: DC
  Filled 2016-04-20: qty 1

## 2016-04-20 MED ORDER — PANTOPRAZOLE SODIUM 40 MG PO TBEC
40.0000 mg | DELAYED_RELEASE_TABLET | Freq: Every day | ORAL | Status: DC
  Administered 2016-04-21: 40 mg via ORAL
  Filled 2016-04-20 (×2): qty 1

## 2016-04-20 MED ORDER — VITAMIN D 1000 UNITS PO TABS
1000.0000 [IU] | ORAL_TABLET | Freq: Every day | ORAL | Status: DC
  Administered 2016-04-21: 1000 [IU] via ORAL
  Filled 2016-04-20 (×2): qty 1

## 2016-04-20 MED ORDER — ASPIRIN 81 MG PO CHEW
324.0000 mg | CHEWABLE_TABLET | Freq: Once | ORAL | Status: AC
  Administered 2016-04-20: 324 mg via ORAL
  Filled 2016-04-20: qty 4

## 2016-04-20 MED ORDER — POTASSIUM CHLORIDE CRYS ER 20 MEQ PO TBCR
40.0000 meq | EXTENDED_RELEASE_TABLET | ORAL | Status: AC
  Administered 2016-04-20 – 2016-04-21 (×2): 40 meq via ORAL
  Filled 2016-04-20 (×2): qty 2

## 2016-04-20 MED ORDER — SODIUM CHLORIDE 0.9% FLUSH
3.0000 mL | Freq: Two times a day (BID) | INTRAVENOUS | Status: DC
  Administered 2016-04-20 – 2016-04-21 (×2): 3 mL via INTRAVENOUS

## 2016-04-20 MED ORDER — ENOXAPARIN SODIUM 40 MG/0.4ML ~~LOC~~ SOLN
40.0000 mg | SUBCUTANEOUS | Status: DC
  Administered 2016-04-20: 40 mg via SUBCUTANEOUS
  Filled 2016-04-20: qty 0.4

## 2016-04-20 MED ORDER — ATORVASTATIN CALCIUM 20 MG PO TABS
40.0000 mg | ORAL_TABLET | Freq: Every day | ORAL | Status: DC
Start: 2016-04-20 — End: 2016-04-21
  Administered 2016-04-20: 40 mg via ORAL
  Filled 2016-04-20 (×2): qty 2

## 2016-04-20 NOTE — H&P (Signed)
[facility] [place] at [facility]   PATIENT NAME: Gregory ElkGeorge Weichert    MR#:  [id number]  DATE OF BIRTH:  1934/01/17  DATE OF ADMISSION:  04/20/2016  PRIMARY CARE PHYSICIAN: PROVIDER NOT IN SYSTEM   REQUESTING/REFERRING PHYSICIAN: Minna AntisKevin Paduchowski MD  CHIEF COMPLAINT:   Chief Complaint  Patient presents with  . Hypotension  . Tachycardia    HISTORY OF PRESENT ILLNESS: [name]  is a 82 y.o. male with a known history of  GERD, hypercholesterolemia and stroke who earlier this morning was not feeling well he checked his blood pressure and he noticed his heart rate was in the 150s and blood pressure was in the 70s. He was feeling very weak and tired. So he called his daughter. When she arrived she notices blood pressure again was low 88/58 heart rate in the 130s. So she decided to take him to the urgent care. Patient by the time he arrived there blood pressure and heart rate were improved however they referred him to the ED here. Patient has been asymptomatic here however his troponin has gone up to 0.12. He denies any chest pain or palpitations no syncope.  PAST MEDICAL HISTORY:   Past Medical History:  Diagnosis Date  . GERD (gastroesophageal reflux disease)   . Hypercholesteremia   . Stroke [facility](HCC)     PAST SURGICAL HISTORY:  Past Surgical History:  Procedure Laterality Date  . TOTAL HIP ARTHROPLASTY Left     SOCIAL HISTORY:  Social History  Substance Use Topics  . Smoking status: Never Smoker  . Smokeless tobacco: Never Used  . Alcohol use No    FAMILY HISTORY:  Family History  Problem Relation Age of Onset  . Hypertension Father     DRUG ALLERGIES: No Known Allergies  REVIEW OF SYSTEMS:   CONSTITUTIONAL: No fever, fatigue or weakness.  EYES: No blurred or double vision.  EARS, NOSE, AND THROAT: No tinnitus or ear pain.  RESPIRATORY: No cough, shortness of breath, wheezing or hemoptysis.  CARDIOVASCULAR: No chest pain, orthopnea,  edema.  GASTROINTESTINAL: No nausea, vomiting, diarrhea or abdominal pain.  GENITOURINARY: No dysuria, hematuria.  ENDOCRINE: No polyuria, nocturia,  HEMATOLOGY: No anemia, easy bruising or bleeding SKIN: No rash or lesion. MUSCULOSKELETAL: No joint pain or arthritis.   NEUROLOGIC: No tingling, numbness, weakness.  PSYCHIATRY: No anxiety or depression.   MEDICATIONS AT HOME:  Prior to Admission medications   Medication Sig Start Date End Date Taking? Authorizing Provider  aspirin EC 81 MG tablet Take 81 mg by mouth daily.    Historical Provider, MD  atorvastatin (LIPITOR) 80 MG tablet Take 40 mg by mouth daily.    Historical Provider, MD  Cholecalciferol 1000 units TBDP Take 1 tablet by mouth daily.    Historical Provider, MD  omeprazole (PRILOSEC) 20 MG capsule Take 20 mg by mouth daily.    Historical Provider, MD  terazosin (HYTRIN) 5 MG capsule Take 5 mg by mouth at bedtime.    Historical Provider, MD      PHYSICAL EXAMINATION:   VITAL SIGNS: Blood pressure (!) 136/101, pulse (!) 59, temperature 97.8 F (36.6 C), temperature source Oral, resp. rate 17, height 5\' 11"  (1.803 m), weight 190 lb (86.2 kg), SpO2 98 %.  GENERAL:  82 y.o.-year-old patient lying in the bed with no acute distress.  EYES: Pupils equal, round, reactive to light and accommodation. No scleral icterus. Extraocular muscles intact.  HEENT: Head atraumatic, normocephalic. Oropharynx and nasopharynx clear.  NECK:  Supple, no jugular venous distention. No thyroid enlargement, no tenderness.  LUNGS: Normal breath sounds bilaterally, no wheezing, rales,rhonchi or crepitation. No use of accessory muscles of respiration.  CARDIOVASCULAR: S1, S2 normal. No murmurs, rubs, or gallops.  ABDOMEN: Soft, nontender, nondistended. Bowel sounds present. No organomegaly or mass.  EXTREMITIES: No pedal edema, cyanosis, or clubbing.  NEUROLOGIC: Cranial nerves II through XII are intact. Muscle strength 5/5 in all extremities.  Sensation intact. Gait not checked.  PSYCHIATRIC: The patient is alert and oriented x 3.  SKIN: No obvious rash, lesion, or ulcer.   LABORATORY PANEL:   CBC  Recent Labs Lab 04/20/16 1538  WBC 5.5  HGB 12.3*  HCT 35.4*  PLT 196  MCV 86.9  MCH 30.2  MCHC 34.7  RDW 14.6*  LYMPHSABS 1.4  MONOABS 1.2*  EOSABS 0.1  BASOSABS 0.0   ------------------------------------------------------------------------------------------------------------------  Chemistries   Recent Labs Lab 04/20/16 1538  NA 139  K 3.1*  CL 108  CO2 25  GLUCOSE 108*  BUN 19  CREATININE 0.97  CALCIUM 8.3*  AST 28  ALT 21  ALKPHOS 71  BILITOT 0.4   ------------------------------------------------------------------------------------------------------------------ estimated creatinine clearance is 65.8 mL/min (by C-G formula based on SCr of 0.97 mg/dL). ------------------------------------------------------------------------------------------------------------------ No results for input(s): TSH, T4TOTAL, T3FREE, THYROIDAB in the last 72 hours.  Invalid input(s): FREET3   Coagulation profile No results for input(s): INR, PROTIME in the last 168 hours. ------------------------------------------------------------------------------------------------------------------- No results for input(s): DDIMER in the last 72 hours. -------------------------------------------------------------------------------------------------------------------  Cardiac Enzymes  Recent Labs Lab 04/20/16 1538 04/20/16 1828  TROPONINI 0.10* 0.12*   ------------------------------------------------------------------------------------------------------------------ Invalid input(s): POCBNP  ---------------------------------------------------------------------------------------------------------------  Urinalysis    Component Value Date/Time   COLORURINE YELLOW (A) 04/20/2016 1538   APPEARANCEUR CLEAR (A) 04/20/2016 1538    APPEARANCEUR Clear 05/16/2012 1535   LABSPEC 1.012 04/20/2016 1538   LABSPEC 1.005 05/16/2012 1535   PHURINE 6.0 04/20/2016 1538   GLUCOSEU NEGATIVE 04/20/2016 1538   GLUCOSEU Negative 05/16/2012 1535   HGBUR NEGATIVE 04/20/2016 1538   BILIRUBINUR NEGATIVE 04/20/2016 1538   BILIRUBINUR Negative 05/16/2012 1535   KETONESUR NEGATIVE 04/20/2016 1538   PROTEINUR NEGATIVE 04/20/2016 1538   NITRITE NEGATIVE 04/20/2016 1538   LEUKOCYTESUR TRACE (A) 04/20/2016 1538   LEUKOCYTESUR Negative 05/16/2012 1535     RADIOLOGY: Ct Head Wo Contrast  Result Date: 04/20/2016 CLINICAL DATA:  Blurred vision in the left time EXAM: CT HEAD WITHOUT CONTRAST TECHNIQUE: Contiguous axial images were obtained from the base of the skull through the vertex without intravenous contrast. COMPARISON:  MR brain of 07/08/2014 and CT brain of 05/16/2012 FINDINGS: Brain: The ventricular system remains prominent, as are the cortical sulci, indicative of diffuse atrophy. Small left basal ganglial infarct is stable. There has been some progression of moderate to severe small vessel ischemic change throughout the periventricular white matter. No new hemorrhage, mass lesion, or acute infarction is seen. Encephalomalacia remains high in the left posterior parietal region near the vertex. Vascular: No vascular abnormality is seen on this unenhanced study. Skull: On bone window images no calvarial abnormality is seen. Sinuses/Orbits: The paranasal sinuses are pneumatized with minimal mucosal thickening in a few ethmoid air cells. The patient appears to have undergone a partial ethmoidectomy. Other: None IMPRESSION: 1. No acute intracranial abnormality. 2. Atrophy and moderate small vessel ischemic change with some progression of the small vessel ischemic change throughout the periventricular white matter. 3. Stable encephalomalacia in the left posterior parietal region. Electronically Signed   By: Lucienne Minks.D.  On: 04/20/2016 16:46     EKG: Orders placed or performed during the hospital encounter of 04/20/16  . ED EKG  . ED EKG    IMPRESSION AND PLAN: Patient is a 82 year old white male presenting with hypotension tachycardia  1. Hypotension tachycardia could be related to an arrhythmia. Such as A. fib with RVR At this point we will admit him under observation and monitor on telemetry Obtain echocardiogram of the heart I will ask cardiology to see may need Holter monitor for home Hold terazosin   2. Elevated troponin without chest pain could be related to demand ischemia from the hypotension tachycardia We'll trend his cardiac enzymes Continue aspirin therapy  3. History of CVA Continue aspirin  4. Hypokalemia Not on any medications that would causes potassium to be low Replace potassium check a magnesium  5. GERD Continue PPI  6. Miscellaneous Lovenox for DVT prophylaxis    All the records are reviewed and case discussed with ED provider. Management plans discussed with the patient, family and they are in agreement.  CODE STATUS:    Code Status Orders        Start     Ordered   04/20/16 1947  Full code  Continuous     04/20/16 1948    Code Status History    Date Active Date Inactive Code Status Order ID Comments User Context   This patient has a current code status but no historical code status.    Advance Directive Documentation   Flowsheet Row Most Recent Value  Type of Advance Directive  Living will  Pre-existing out of facility DNR order (yellow form or pink MOST form)  No data  "MOST" Form in Place?  No data       TOTAL TIME TAKING CARE OF THIS PATIENT: .    Auburn Bilberry M.D on 04/20/2016 at 7:53 PM  Between 7am to 6pm - Pager - [phone]  After 6pm go to [email or web address] - password [email or web address]  [facility]  Office  [phone]  CC: Primary care physician; PROVIDER NOT IN SYSTEM

## 2016-04-20 NOTE — ED Notes (Signed)
Pt presents to ED with his family. Pt's daughter states BP was in the 80's systolic, so they waited then rechecked. Pt's daughter reports increasing pulses with a HR up to 150. Pt's daughter states most recent BP and pulse from home machine was BP 88/58, with a pulse of 68. Pt is alert and oriented at this time. Pt states when this occurred he was feeling weak.

## 2016-04-20 NOTE — ED Triage Notes (Signed)
Pt presents to ED with his family. Pt's daughter states BP was in the 80's systolic, so they waited then rechecked. Pt's daughter reports increasing pulses with a HR up to 150. Pt's daughter states most recent BP and pulse from home machine was BP 88/58, with a pulse of 68. Pt is alert and oriented at this time.

## 2016-04-20 NOTE — ED Triage Notes (Signed)
Patient c/o having a low blood pressure reading this morning.  Patient reports having a fast heart rate this morning.  Patient c/o diarrhea that started yesterday done today.

## 2016-04-20 NOTE — ED Notes (Addendum)
Pt had black tarry stool when up to bathroom. MD Paduchowski informed. MD performed rectal exam. No blood seen in stool

## 2016-04-20 NOTE — ED Provider Notes (Addendum)
[facility] Emergency Department Provider Note  Time seen: 7:27 PM  I have reviewed the triage vital signs and the nursing notes.   HISTORY  Chief Complaint Hypotension and Tachycardia    HPI [name] is a 82 y.o. male with a past medical history of gastric reflux, hyperlipidemia, CVA, who presents the emergency department with an elevated heart rate, low blood pressure and not feeling well. According to the patient every morning he checks his heart rate and blood pressure. This morning when he checked his blood pressure was low around 100 systolic, he states he was not feeling well, felt sluggish and generalized weakness. He took his pulse and it was greater than 150. Patient states he repeated this several times with similar results. When his daughter got home at 1:30 PM she checked it and it was normal, and the patient stated at that time he was feeling back to normal. They went to urgent care for evaluation, and were referred to the emergency department.Upon arrival patient appears well, denies any complaints at this time.  Past Medical History:  Diagnosis Date  . GERD (gastroesophageal reflux disease)   . Hypercholesteremia   . Stroke [facility])     There are no active problems to display for this patient.   Past Surgical History:  Procedure Laterality Date  . TOTAL HIP ARTHROPLASTY Left     Prior to Admission medications   Medication Sig Start Date End Date Taking? Authorizing Provider  aspirin EC 81 MG tablet Take 81 mg by mouth daily.    Historical Provider, MD  atorvastatin (LIPITOR) 80 MG tablet Take 40 mg by mouth daily.    Historical Provider, MD  Cholecalciferol 1000 units TBDP Take 1 tablet by mouth daily.    Historical Provider, MD  omeprazole (PRILOSEC) 20 MG capsule Take 20 mg by mouth daily.    Historical Provider, MD  terazosin (HYTRIN) 5 MG capsule Take 5 mg by mouth at bedtime.    Historical Provider, MD    No Known  Allergies  History reviewed. No pertinent family history.  Social History Social History  Substance Use Topics  . Smoking status: Never Smoker  . Smokeless tobacco: Never Used  . Alcohol use No    Review of Systems Constitutional: Negative for fever. Cardiovascular: Negative for chest pain. Respiratory: Negative for shortness of breath. Gastrointestinal: Negative for abdominal pain Musculoskeletal: Negative for back pain. Neurological: Negative for headaches, focal weakness or numbness. 10-point ROS otherwise negative.  ____________________________________________   PHYSICAL EXAM:  VITAL SIGNS: ED Triage Vitals  Enc Vitals Group     BP 04/20/16 1528 (!) 105/59     Pulse Rate 04/20/16 1528 73     Resp 04/20/16 1528 18     Temp 04/20/16 1528 97.8 F (36.6 C)     Temp Source 04/20/16 1528 Oral     SpO2 04/20/16 1528 96 %     Weight 04/20/16 1529 190 lb (86.2 kg)     Height 04/20/16 1529 5\' 11"  (1.803 m)     Head Circumference --      Peak Flow --      Pain Score 04/20/16 1533 0     Pain Loc --      Pain Edu? --      Excl. in GC? --     Constitutional: Alert and oriented. Well appearing and in no distress. Eyes: Normal exam ENT   Head: Normocephalic and atraumatic.   Mouth/Throat: Mucous membranes are moist.  Cardiovascular: Normal rate, regular rhythm.  Respiratory: Normal respiratory effort without tachypnea nor retractions. Breath sounds are clear  Gastrointestinal: Soft and nontender. No distention.   Musculoskeletal: Nontender with normal range of motion in all extremities. No lower extremity tenderness or edema. Neurologic:  Normal speech and language. No gross focal neurologic deficits Skin:  Skin is warm, dry and intact.  Psychiatric: Mood and affect are normal.   ____________________________________________    EKG  EKG reviewed and interpreted by myself shows normal sinus rhythm at 79 bpm, narrow QRS, left axis deviation, nonspecific ST  changes without concerning ST elevation.  ____________________________________________    RADIOLOGY  CT head is negative.  ____________________________________________   INITIAL IMPRESSION / ASSESSMENT AND PLAN / ED COURSE  Pertinent labs & imaging results that were available during my care of the patient were reviewed by me and considered in my medical decision making (see chart for details).  Patient presents the emergency department not feeling well with a low blood pressure and elevated heart rate, now is vitals are normal the patient denies any symptoms. Patient's troponin is slightly elevated 0.1, repeat troponin elevated to 0.12. I discussed with the patient the possibility of an arrhythmia such as A. fib with RVR leading to demand ischemia, but as we are not able to record her rhythm when he was symptomatic we cannot be sure. Patient denies any chest pain at any point, but states he was feeling very weak and sluggish this morning. Given the patient's elevated troponin and possibility of arrhythmia we'll admit to the hospital for further evaluation and monitoring on telemetry. Patient is agreeable to plan.  ----------------------------------------- 8:55 PM on 04/20/2016 -----------------------------------------  The patient had a very dark stool in the emergency department. Rectal exam was performed by myself, the stool is very dark over the guaiac is negative.  ____________________________________________   FINAL CLINICAL IMPRESSION(S) / ED DIAGNOSES  Tachycardia Generalized weakness Elevated troponin    Minna AntisKevin Anterrio Mccleery, MD 04/20/16 1932    Minna AntisKevin Chrisann Melaragno, MD 04/20/16 2055

## 2016-04-20 NOTE — ED Notes (Signed)
Pt's daughter reports blurry vision to L eye when trying to read his BP machine. Pt denies numbness, tingling, dizziness, changes in speech or any other symptoms.

## 2016-04-20 NOTE — ED Notes (Signed)
Pt's daughter [name] would like to be contacted for any change in pt status. Pt gave verbal consent.   [phone]

## 2016-04-20 NOTE — ED Provider Notes (Signed)
[facility]    CSN: [id number] Arrival date & time: 04/20/16  1353  First Provider Contact:  First MD Initiated Contact with Patient 04/20/16 1420        History   Chief Complaint Chief Complaint  Patient presents with  . Diarrhea  . Anxiety    HPI Mickie BailGeorge Thomas Corl is a 82 y.o. male.   Patient's here because of several different states symptoms. According to him and his daughter he had diarrhea yesterday along with the diarrhea that has gotten actually better today he checks his blood pressure and his pulse is regular basis every day. This morning when he got up about 8:30 states his vision was blurred that his pulse was in the 100 2030 range and that his systolic blood pressure was only in the 80s. Fortunately about 4-5 hours later he is feeling better but initially he called his as his daughter puts it his male friend who called her while she was at the hairdresser having color partner hair. She finally was able to get out of the hairdresser salon to check on him and states that she found him real pale and just not himself initially. This is therefore hours after the initial symptoms he had as as stated above he feels much better now but he did have a stroke about 2 years ago and CT scan and MRI confirmed that he had probably had a few others as well. He reports that sometimes when he tries ago his motorcycle at a [employer]379 he'll have some blurred vision but this was different where he had his glasses on and he really just cannot read the writing soreness meter this morning. Reports having some palpitation but mainly the fast rate this morning. Along with having history of hypercholesterolemia and stroke is also has history of GERD and has had his hip replaced. He never smoked and there is no pertinent or relevant past family medical history pertaining to today's visit   The history is provided by the patient and a relative. No language interpreter was used.  Diarrhea    Severity:  Moderate Progression:  Resolved Relieved by:  Nothing Worsened by:  Nothing Ineffective treatments:  None tried Anxiety     Past Medical History:  Diagnosis Date  . GERD (gastroesophageal reflux disease)   . Hypercholesteremia   . Stroke [facility](HCC)     There are no active problems to display for this patient.   Past Surgical History:  Procedure Laterality Date  . TOTAL HIP ARTHROPLASTY Left        Home Medications    Prior to Admission medications   Medication Sig Start Date End Date Taking? Authorizing Provider  aspirin EC 81 MG tablet Take 81 mg by mouth daily.   Yes Historical Provider, MD  atorvastatin (LIPITOR) 80 MG tablet Take 40 mg by mouth daily.   Yes Historical Provider, MD  Cholecalciferol 1000 units TBDP Take 1 tablet by mouth daily.   Yes Historical Provider, MD  omeprazole (PRILOSEC) 20 MG capsule Take 20 mg by mouth daily.   Yes Historical Provider, MD  terazosin (HYTRIN) 5 MG capsule Take 5 mg by mouth at bedtime.   Yes Historical Provider, MD    Family History History reviewed. No pertinent family history.  Social History Social History  Substance Use Topics  . Smoking status: Never Smoker  . Smokeless tobacco: Never Used  . Alcohol use No     Allergies   Review of patient's allergies indicates no  known allergies.   Review of Systems Review of Systems  Constitutional: Positive for activity change.  Eyes: Positive for visual disturbance.  Cardiovascular: Positive for palpitations.  Gastrointestinal: Positive for diarrhea.  Neurological: Positive for weakness.  Psychiatric/Behavioral: The patient is nervous/anxious.   All other systems reviewed and are negative.    Physical Exam Triage Vital Signs ED Triage Vitals  Enc Vitals Group     BP 04/20/16 1406 (!) 112/34     Pulse Rate 04/20/16 1406 79     Resp 04/20/16 1406 18     Temp 04/20/16 1406 97.6 F (36.4 C)     Temp Source 04/20/16 1406 Oral     SpO2 04/20/16 1406  98 %     Weight 04/20/16 1406 190 lb (86.2 kg)     Height 04/20/16 1406 5\' 11"  (1.803 m)     Head Circumference --      Peak Flow --      Pain Score 04/20/16 1410 0     Pain Loc --      Pain Edu? --      Excl. in GC? --    No data found.   Updated Vital Signs BP (!) 112/34 (BP Location: Left Arm)   Pulse 79   Temp 97.6 F (36.4 C) (Oral)   Resp 18   Ht 5\' 11"  (1.803 m)   Wt 190 lb (86.2 kg)   SpO2 98%   BMI 26.50 kg/m   Visual Acuity Right Eye Distance:   Left Eye Distance:   Bilateral Distance:    Right Eye Near:   Left Eye Near:    Bilateral Near:     Physical Exam  Constitutional: He is oriented to person, place, and time. He appears well-developed and well-nourished.  HENT:  Head: Normocephalic and atraumatic.  Right Ear: External ear normal.  Left Ear: External ear normal.  Eyes: Pupils are equal, round, and reactive to light.  Neck: Normal range of motion. Neck supple.  Cardiovascular: Normal rate and regular rhythm.   Pulmonary/Chest: Effort normal.  Musculoskeletal: Normal range of motion.  Neurological: He is alert and oriented to person, place, and time.  Skin: Skin is warm.  Psychiatric: He has a normal mood and affect.  Vitals reviewed.    UC Treatments / Results  Labs (all labs ordered are listed, but only abnormal results are displayed) Labs Reviewed - No data to display  EKG  EKG Interpretation None        ED ECG REPORT I, Marigold Mom H, the attending physician, personally viewed and interpreted this ECG.   Date: 04/20/2016  EKG Time: 14:10:39  Rate:78  Rhythm: normal EKG, normal sinus rhythm, unchanged from previous tracings, normal sinus rhythm, PAC's noted, occasional PVC noted, , aberrant conduction, L axis deviation  Axis: 44  Intervals:none  ST&T Change: none Radiology No results found.  Procedures Procedures (including critical care time)  Medications Ordered in UC Medications - No data to display   Initial  Impression / Assessment and Plan / UC Course  I have reviewed the triage vital signs and the nursing notes.  Pertinent labs & imaging results that were available during my care of the patient were reviewed by me and considered in my medical decision making (see chart for details).  Clinical Course  His daughter states that she realizes now that this was not placed ring him she just became concerned because he looked so pale when she first saw him.  His daughters agreed  to take him to [facility] ED to be evaluated discussed case with charge nurse Tammy Sours who is aware of his eminent arrival. Discussed whether EMS was needed and daughters declined EMS transport at this time    Final Clinical Impressions(s) / UC Diagnoses   Final diagnoses:  Blurred vision, bilateral  Hypotension, unspecified hypotension type  Tachycardia, unspecified  Diarrhea, unspecified type    New Prescriptions Discharge Medication List as of 04/20/2016  2:32 PM       Hassan Rowan, MD 04/20/16 1512

## 2016-04-21 ENCOUNTER — Observation Stay
Admit: 2016-04-21 | Discharge: 2016-04-21 | Disposition: A | Discharge status: Home / Self Care | Payer: Medicare Other | Attending: Internal Medicine | Admitting: Internal Medicine

## 2016-04-21 DIAGNOSIS — N400 Enlarged prostate without lower urinary tract symptoms: Secondary | ICD-10-CM | POA: Diagnosis not present

## 2016-04-21 DIAGNOSIS — R000 Tachycardia, unspecified: Secondary | ICD-10-CM | POA: Diagnosis not present

## 2016-04-21 DIAGNOSIS — I959 Hypotension, unspecified: Secondary | ICD-10-CM | POA: Diagnosis not present

## 2016-04-21 DIAGNOSIS — I480 Paroxysmal atrial fibrillation: Secondary | ICD-10-CM | POA: Diagnosis not present

## 2016-04-21 DIAGNOSIS — K219 Gastro-esophageal reflux disease without esophagitis: Secondary | ICD-10-CM | POA: Diagnosis not present

## 2016-04-21 LAB — BASIC METABOLIC PANEL
Anion gap: 5 (ref 5–15)
BUN: 19 mg/dL (ref 6–20)
CHLORIDE: 114 mmol/L — AB (ref 101–111)
CO2: 26 mmol/L (ref 22–32)
CREATININE: 0.94 mg/dL (ref 0.61–1.24)
Calcium: 8.2 mg/dL — ABNORMAL LOW (ref 8.9–10.3)
GFR calc Af Amer: >60 mL/min (ref >60)
GFR calc non Af Amer: >60 mL/min (ref >60)
GLUCOSE: 96 mg/dL (ref 65–99)
Potassium: 3.7 mmol/L (ref 3.5–5.1)
Sodium: 145 mmol/L (ref 135–145)

## 2016-04-21 LAB — ECHOCARDIOGRAM COMPLETE
Height: 71 in
Weight: 3006.4 oz

## 2016-04-21 LAB — TROPONIN I: Troponin I: 0.13 ng/mL (ref <0.03)

## 2016-04-21 LAB — CBC
HCT: 33 % — ABNORMAL LOW (ref 40.0–52.0)
Hemoglobin: 11.5 g/dL — ABNORMAL LOW (ref 13.0–18.0)
MCH: 30.4 pg (ref 26.0–34.0)
MCHC: 34.8 g/dL (ref 32.0–36.0)
MCV: 87.4 fL (ref 80.0–100.0)
PLATELETS: 194 10*3/uL (ref 150–440)
RBC: 3.78 MIL/uL — ABNORMAL LOW (ref 4.40–5.90)
RDW: 14.4 % (ref 11.5–14.5)
WBC: 6 10*3/uL (ref 3.8–10.6)

## 2016-04-21 MED ORDER — ENOXAPARIN SODIUM 80 MG/0.8ML ~~LOC~~ SOLN
80.0000 mg | Freq: Two times a day (BID) | SUBCUTANEOUS | Status: DC
  Administered 2016-04-21: 80 mg via SUBCUTANEOUS
  Filled 2016-04-21: qty 0.8

## 2016-04-21 MED ORDER — ASPIRIN EC 81 MG PO TBEC: 81.0000 mg | DELAYED_RELEASE_TABLET | Freq: Every day | ORAL | Status: DC

## 2016-04-21 MED ORDER — ATORVASTATIN CALCIUM 20 MG PO TABS: 40.0000 mg | ORAL_TABLET | Freq: Every day | ORAL | Status: DC

## 2016-04-21 NOTE — Consult Note (Signed)
[facility]  CARDIOLOGY CONSULT NOTE  Patient ID: Gregory BailGeorge Thomas [name] MRN: [id number] DOB/AGE: 1934/02/05 82 y.o.  Admit date: 04/20/2016 Referring Physician [place] [facility]  Reason for Consultation Borderline elevated troponin  HPI: 82 year old gentleman referred for evaluation of borderline elevated troponin. The patient apparently was in his usual state of health until yesterday when he took his blood pressure and noted to be tachycardic with heart rates in the 130s to 150s. Blood pressure apparently was low 88/58. The patient [facility] room where he was noted to be in sinus rhythm. Admission labs were notable for borderline elevated troponin of 0.12 without peak or trough, without ischemic ST changes, without chest pain.  Review of systems complete and found to be negative unless listed above     Past Medical History:  Diagnosis Date  . GERD (gastroesophageal reflux disease)   . Hypercholesteremia   . Stroke [facility](HCC)     Past Surgical History:  Procedure Laterality Date  . TOTAL HIP ARTHROPLASTY Left     Prescriptions Prior to Admission  Medication Sig Dispense Refill Last Dose  . aspirin EC 81 MG tablet Take 81 mg by mouth daily.     Marland Kitchen. atorvastatin (LIPITOR) 80 MG tablet Take 40 mg by mouth daily.     . Cholecalciferol 1000 units TBDP Take 1 tablet by mouth daily.     Marland Kitchen. omeprazole (PRILOSEC) 20 MG capsule Take 20 mg by mouth daily.     Marland Kitchen. terazosin (HYTRIN) 5 MG capsule Take 5 mg by mouth at bedtime.      Social History   Social History  . Marital status: Married    Spouse name: N/A  . Number of children: N/A  . Years of education: N/A   Occupational History  . Not on file.   Social History Main Topics  . Smoking status: Never Smoker  . Smokeless tobacco: Never Used  . Alcohol use No  . Drug use: No  . Sexual activity: Not on file   Other Topics Concern  . Not on file   Social History Narrative   . No narrative on file    Family History  Problem Relation Age of Onset  . Hypertension Father       Review of systems complete and found to be negative unless listed above      PHYSICAL EXAM  General: Well developed, well nourished, in no acute distress HEENT:  Normocephalic and atramatic Neck:  No JVD.  Lungs: Clear bilaterally to auscultation and percussion. Heart: HRRR . Normal S1 and S2 without gallops or murmurs.  Abdomen: Bowel sounds are positive, abdomen soft and non-tender  Msk:  Back normal, normal gait. Normal strength and tone for age. Extremities: No clubbing, cyanosis or edema.   Neuro: Alert and oriented X 3. Psych:  Good affect, responds appropriately  Labs:   Lab Results  Component Value Date   WBC 6.0 04/21/2016   HGB 11.5 (L) 04/21/2016   HCT 33.0 (L) 04/21/2016   MCV 87.4 04/21/2016   PLT 194 04/21/2016    Recent Labs Lab 04/20/16 1538 04/21/16 0355  NA 139 145  K 3.1* 3.7  CL 108 114*  CO2 25 26  BUN 19 19  CREATININE 0.97 0.94  CALCIUM 8.3* 8.2*  PROT 6.2*  --   BILITOT 0.4  --   ALKPHOS 71  --   ALT 21  --   AST 28  --   GLUCOSE 108* 96  Lab Results  Component Value Date   CKTOTAL 92 05/16/2012   CKMB 2.7 05/16/2012   TROPONINI 0.13 (HH) 04/21/2016   No results found for: CHOL No results found for: HDL No results found for: LDLCALC No results found for: TRIG No results found for: CHOLHDL No results found for: LDLDIRECT    Radiology: Ct Head Wo Contrast  Result Date: 04/20/2016 CLINICAL DATA:  Blurred vision in the left time EXAM: CT HEAD WITHOUT CONTRAST TECHNIQUE: Contiguous axial images were obtained from the base of the skull through the vertex without intravenous contrast. COMPARISON:  MR brain of 07/08/2014 and CT brain of 05/16/2012 FINDINGS: Brain: The ventricular system remains prominent, as are the cortical sulci, indicative of diffuse atrophy. Small left basal ganglial infarct is stable. There has been some  progression of moderate to severe small vessel ischemic change throughout the periventricular white matter. No new hemorrhage, mass lesion, or acute infarction is seen. Encephalomalacia remains high in the left posterior parietal region near the vertex. Vascular: No vascular abnormality is seen on this unenhanced study. Skull: On bone window images no calvarial abnormality is seen. Sinuses/Orbits: The paranasal sinuses are pneumatized with minimal mucosal thickening in a few ethmoid air cells. The patient appears to have undergone a partial ethmoidectomy. Other: None IMPRESSION: 1. No acute intracranial abnormality. 2. Atrophy and moderate small vessel ischemic change with some progression of the small vessel ischemic change throughout the periventricular white matter. 3. Stable encephalomalacia in the left posterior parietal region. Electronically Signed   By: Dwyane Dee M.D.   On: 04/20/2016 16:46    EKG: Sinus rhythm  ASSESSMENT AND PLAN:   1. Borderline elevated troponin, without peak or trough, without ischemic ECG changes, without chest pain, likely due to tachycardia of unknown etiology 2. Tachycardia, currently in sinus rhythm, possible paroxysmal atrial fibrillation without documentation  Recommendations  1. Agree with overall current therapy 2. Defer full dose anticoagulation, or chronic anticoagulation at this time 3. 48 hour Holter monitor 4. Review 2-D echocardiogram 5. Consider outpatient functional study  Signed: Dravyn Severs MD,PhD, [facility] 04/21/2016, 9:26 AM

## 2016-04-21 NOTE — Discharge Summary (Signed)
[employer] - [place] at [facility]   PATIENT NAME: [name]    MR#:  [id number]  DATE OF BIRTH:  May 25, 1934  DATE OF ADMISSION:  04/20/2016 ADMITTING PHYSICIAN: Auburn Bilberry, MD  DATE OF DISCHARGE: 04/21/2016 12:56 PM  PRIMARY CARE PHYSICIAN: PROVIDER NOT IN SYSTEM    ADMISSION DIAGNOSIS:  Tachycardia [R00.0] Generalized weakness [R53.1]  DISCHARGE DIAGNOSIS:  Active Problems:   Tachycardia   SECONDARY DIAGNOSIS:   Past Medical History:  Diagnosis Date  . GERD (gastroesophageal reflux disease)   . Hypercholesteremia   . Stroke [facility])     HOSPITAL COURSE:   1. Hypotension and tachycardia as outpatient. On telemetry monitoring in the hospital no arrhythmias noted. Seen by cardiology. Holter monitor set up as outpatient and follow-up with Dr. Evette Georges as outpatient. Advised the patient must stay hydrated. Cut back on coffee consumption. 2. Elevated troponin without chest pain. Could be demand ischemia from hypotension and tachycardia that was briefly observed by the patient as outpatient. Patient can have a stress test as outpatient. Continue aspirin 3. History of CVA. Continue aspirin 4. Hypokalemia. Replaced 5. GERD on PPI 6. Hyperlipidemia unspecified on atorvastatin 7. BPH on Hytrin  DISCHARGE CONDITIONS:   Satisfactory  CONSULTS OBTAINED:  Treatment Team:  Marcina Millard, MD  DRUG ALLERGIES:  No Known Allergies  DISCHARGE MEDICATIONS:   Discharge Medication List as of 04/21/2016 12:26 PM    CONTINUE these medications which have NOT CHANGED   Details  aspirin EC 81 MG tablet Take 81 mg by mouth daily., Historical Med    atorvastatin (LIPITOR) 80 MG tablet Take 40 mg by mouth daily., Historical Med    Cholecalciferol 1000 units TBDP Take 1 tablet by mouth daily., Historical Med    omeprazole (PRILOSEC) 20 MG capsule Take 20 mg by mouth daily., Historical Med    terazosin (HYTRIN) 5 MG capsule Take 5 mg by mouth at  bedtime., Historical Med         DISCHARGE INSTRUCTIONS:   Follow-up with your medical doctor one week Follow-up with Dr. Evette Georges one week for Holter monitoring results and cardiology follow-up.  If you experience worsening of your admission symptoms, develop shortness of breath, life threatening emergency, suicidal or homicidal thoughts you must seek medical attention immediately by calling 911 or calling your MD immediately  if symptoms less severe.  You Must read complete instructions/literature along with all the possible adverse reactions/side effects for all the Medicines you take and that have been prescribed to you. Take any new Medicines after you have completely understood and accept all the possible adverse reactions/side effects.   Please note  You were cared for by a hospitalist during your hospital stay. If you have any questions about your discharge medications or the care you received while you were in the hospital after you are discharged, you can call the unit and asked to speak with the hospitalist on call if the hospitalist that took care of you is not available. Once you are discharged, your primary care physician will handle any further medical issues. Please note that NO REFILLS for any discharge medications will be authorized once you are discharged, as it is imperative that you return to your primary care physician (or establish a relationship with a primary care physician if you do not have one) for your aftercare needs so that they can reassess your need for medications and monitor your lab values.    Today   CHIEF COMPLAINT:   Chief Complaint  Patient presents with  . Hypotension  . Tachycardia    HISTORY OF PRESENT ILLNESS:  [name]  is a 82 y.o. male presented with hypotension and tachycardia as outpatient.   VITAL SIGNS:  Blood pressure (!) 137/108, pulse 71, temperature 98.3 F (36.8 C), temperature source Oral, resp. rate 14, height 5'  11" (1.803 m), weight 85.2 kg (187 lb 14.4 oz), SpO2 95 %.  Blood pressure was 128/50 when I saw him. I do not believe this 137/108.  PHYSICAL EXAMINATION:  GENERAL:  82 y.o.-year-old patient lying in the bed with no acute distress.  EYES: Pupils equal, round, reactive to light and accommodation. No scleral icterus. Extraocular muscles intact.  HEENT: Head atraumatic, normocephalic. Oropharynx and nasopharynx clear.  NECK:  Supple, no jugular venous distention. No thyroid enlargement, no tenderness.  LUNGS: Normal breath sounds bilaterally, no wheezing, rales,rhonchi or crepitation. No use of accessory muscles of respiration.  CARDIOVASCULAR: S1, S2 normal. No murmurs, rubs, or gallops.  ABDOMEN: Soft, non-tender, non-distended. Bowel sounds present. No organomegaly or mass.  EXTREMITIES: No pedal edema, cyanosis, or clubbing.  NEUROLOGIC: Cranial nerves II through XII are intact. Muscle strength 5/5 in all extremities. Sensation intact. Gait not checked.  PSYCHIATRIC: The patient is alert and oriented x 3.  SKIN: No obvious rash, lesion, or ulcer.   DATA REVIEW:   CBC  Recent Labs Lab 04/21/16 0355  WBC 6.0  HGB 11.5*  HCT 33.0*  PLT 194    Chemistries   Recent Labs Lab 04/20/16 1538 04/20/16 2300 04/21/16 0355  NA 139  --  145  K 3.1*  --  3.7  CL 108  --  114*  CO2 25  --  26  GLUCOSE 108*  --  96  BUN 19  --  19  CREATININE 0.97  --  0.94  CALCIUM 8.3*  --  8.2*  MG  --  1.8  --   AST 28  --   --   ALT 21  --   --   ALKPHOS 71  --   --   BILITOT 0.4  --   --     Cardiac Enzymes  Recent Labs Lab 04/21/16 0355  TROPONINI 0.13*     RADIOLOGY:  Ct Head Wo Contrast  Result Date: 04/20/2016 CLINICAL DATA:  Blurred vision in the left time EXAM: CT HEAD WITHOUT CONTRAST TECHNIQUE: Contiguous axial images were obtained from the base of the skull through the vertex without intravenous contrast. COMPARISON:  MR brain of 07/08/2014 and CT brain of 05/16/2012  FINDINGS: Brain: The ventricular system remains prominent, as are the cortical sulci, indicative of diffuse atrophy. Small left basal ganglial infarct is stable. There has been some progression of moderate to severe small vessel ischemic change throughout the periventricular white matter. No new hemorrhage, mass lesion, or acute infarction is seen. Encephalomalacia remains high in the left posterior parietal region near the vertex. Vascular: No vascular abnormality is seen on this unenhanced study. Skull: On bone window images no calvarial abnormality is seen. Sinuses/Orbits: The paranasal sinuses are pneumatized with minimal mucosal thickening in a few ethmoid air cells. The patient appears to have undergone a partial ethmoidectomy. Other: None IMPRESSION: 1. No acute intracranial abnormality. 2. Atrophy and moderate small vessel ischemic change with some progression of the small vessel ischemic change throughout the periventricular white matter. 3. Stable encephalomalacia in the left posterior parietal region. Electronically Signed   By: Dwyane DeePaul  Barry M.D.   On: 04/20/2016 16:46  Management plans discussed with the patient, family and they are in agreement.  CODE STATUS:  Code Status History    Date Active Date Inactive Code Status Order ID Comments User Context   04/20/2016  7:48 PM 04/21/2016  7:57 AM Full Code [id number]  Auburn Bilberry, MD ED    Advance Directive Documentation   Flowsheet Row Most Recent Value  Type of Advance Directive  Living will  Pre-existing out of facility DNR order (yellow form or pink MOST form)  No data  "MOST" Form in Place?  No data      TOTAL TIME TAKING CARE OF THIS PATIENT: 35 minutes.    Alford Highland M.D on 04/21/2016 at 4:50 PM  Between 7am to 6pm - Pager - [phone]  After 6pm go to [email or web address] - password [email or web address]  [employer] Office  [phone]  CC: Primary care physician; PROVIDER NOT IN SYSTEM

## 2016-04-21 NOTE — Progress Notes (Signed)
*  PRELIMINARY RESULTS* Echocardiogram 2D Echocardiogram has been performed.  Cristela BlueHege, Fahima Cifelli 04/21/2016, 8:36 AM

## 2016-04-21 NOTE — Progress Notes (Signed)
MD Pyreddy aware of new troponin levels, Verbal order received to administer 80 mg of Lovenox now and to change previous dose of 40 mg to 80 mg every 12 hours.

## 2016-04-21 NOTE — Care Management Obs Status (Signed)
MEDICARE OBSERVATION STATUS NOTIFICATION   Patient Details  Name: [name] MRN: [id number] Date of Birth: 02/29/1934   Medicare Observation Status Notification Given:  Yes    Eber HongGreene, Ashrita Chrismer R, RN 04/21/2016, 10:00 AM

## 2016-04-21 NOTE — Progress Notes (Signed)
Pt has orders to be discharged. Discharge instructions given and pt has no additional questions at this time. Medication regimen reviewed and pt educated. Pt verbalized understanding and has no additional questions. Telemetry box removed. IV removed and site in good condition. Pt stable and waiting for transportation.   Desiray Orchard RN 

## 2016-05-01 DIAGNOSIS — Z87898 Personal history of other specified conditions: Secondary | ICD-10-CM | POA: Diagnosis not present

## 2016-05-01 DIAGNOSIS — I480 Paroxysmal atrial fibrillation: Secondary | ICD-10-CM | POA: Diagnosis present

## 2016-05-01 DIAGNOSIS — N400 Enlarged prostate without lower urinary tract symptoms: Secondary | ICD-10-CM | POA: Diagnosis present

## 2016-05-01 DIAGNOSIS — Z8673 Personal history of transient ischemic attack (TIA), and cerebral infarction without residual deficits: Secondary | ICD-10-CM | POA: Diagnosis not present

## 2016-05-01 DIAGNOSIS — E785 Hyperlipidemia, unspecified: Secondary | ICD-10-CM | POA: Diagnosis present

## 2016-05-01 DIAGNOSIS — E7800 Pure hypercholesterolemia, unspecified: Secondary | ICD-10-CM | POA: Diagnosis not present

## 2016-07-21 DIAGNOSIS — G8929 Other chronic pain: Secondary | ICD-10-CM | POA: Diagnosis not present

## 2016-07-21 DIAGNOSIS — E784 Other hyperlipidemia: Secondary | ICD-10-CM | POA: Diagnosis not present

## 2016-07-21 DIAGNOSIS — Z5189 Encounter for other specified aftercare: Secondary | ICD-10-CM | POA: Diagnosis not present

## 2016-07-21 DIAGNOSIS — I4891 Unspecified atrial fibrillation: Secondary | ICD-10-CM | POA: Diagnosis not present

## 2016-07-21 DIAGNOSIS — E559 Vitamin D deficiency, unspecified: Secondary | ICD-10-CM | POA: Diagnosis not present

## 2016-07-21 DIAGNOSIS — K5909 Other constipation: Secondary | ICD-10-CM | POA: Diagnosis not present

## 2016-07-21 DIAGNOSIS — R262 Difficulty in walking, not elsewhere classified: Secondary | ICD-10-CM | POA: Diagnosis not present

## 2016-07-21 DIAGNOSIS — M9701XA Periprosthetic fracture around internal prosthetic right hip joint, initial encounter: Secondary | ICD-10-CM | POA: Diagnosis not present

## 2016-07-21 DIAGNOSIS — Z96641 Presence of right artificial hip joint: Secondary | ICD-10-CM | POA: Diagnosis not present

## 2016-07-21 DIAGNOSIS — E785 Hyperlipidemia, unspecified: Secondary | ICD-10-CM | POA: Diagnosis not present

## 2016-07-21 DIAGNOSIS — M6281 Muscle weakness (generalized): Secondary | ICD-10-CM | POA: Diagnosis not present

## 2016-07-21 DIAGNOSIS — K219 Gastro-esophageal reflux disease without esophagitis: Secondary | ICD-10-CM | POA: Diagnosis not present

## 2016-07-23 DIAGNOSIS — M9701XA Periprosthetic fracture around internal prosthetic right hip joint, initial encounter: Secondary | ICD-10-CM | POA: Diagnosis not present

## 2016-07-23 DIAGNOSIS — I4891 Unspecified atrial fibrillation: Secondary | ICD-10-CM | POA: Diagnosis not present

## 2016-07-23 DIAGNOSIS — E784 Other hyperlipidemia: Secondary | ICD-10-CM | POA: Diagnosis not present

## 2016-07-23 DIAGNOSIS — K219 Gastro-esophageal reflux disease without esophagitis: Secondary | ICD-10-CM | POA: Diagnosis not present

## 2016-08-08 DIAGNOSIS — T814XXD Infection following a procedure, subsequent encounter: Secondary | ICD-10-CM | POA: Diagnosis not present

## 2016-08-08 DIAGNOSIS — Z7901 Long term (current) use of anticoagulants: Secondary | ICD-10-CM | POA: Diagnosis not present

## 2016-08-08 DIAGNOSIS — I4891 Unspecified atrial fibrillation: Secondary | ICD-10-CM | POA: Diagnosis not present

## 2016-08-08 DIAGNOSIS — Z96641 Presence of right artificial hip joint: Secondary | ICD-10-CM | POA: Diagnosis not present

## 2016-08-09 ENCOUNTER — Emergency Department
Admission: EM | Admit: 2016-08-09 | Discharge: 2016-08-09 | Disposition: A | Discharge status: Home / Self Care | Payer: Medicare Other | Attending: Emergency Medicine | Admitting: Emergency Medicine

## 2016-08-09 DIAGNOSIS — R339 Retention of urine, unspecified: Secondary | ICD-10-CM | POA: Insufficient documentation

## 2016-08-09 DIAGNOSIS — Z7982 Long term (current) use of aspirin: Secondary | ICD-10-CM | POA: Diagnosis not present

## 2016-08-09 DIAGNOSIS — Z79899 Other long term (current) drug therapy: Secondary | ICD-10-CM | POA: Insufficient documentation

## 2016-08-09 LAB — URINALYSIS, COMPLETE (UACMP) WITH MICROSCOPIC
BACTERIA UA: NONE SEEN
Bilirubin Urine: NEGATIVE
GLUCOSE, UA: NEGATIVE mg/dL
Hgb urine dipstick: NEGATIVE
Ketones, ur: NEGATIVE mg/dL
Leukocytes, UA: NEGATIVE
Nitrite: NEGATIVE
PROTEIN: NEGATIVE mg/dL
Specific Gravity, Urine: 1.011 (ref 1.005–1.030)
pH: 5 (ref 5.0–8.0)

## 2016-08-09 MED ORDER — CIPROFLOXACIN HCL 500 MG PO TABS
500.0000 mg | ORAL_TABLET | Freq: Once | ORAL | Status: AC
  Administered 2016-08-09: 500 mg via ORAL
  Filled 2016-08-09: qty 1

## 2016-08-09 MED ORDER — CIPROFLOXACIN HCL 500 MG PO TABS: 500.0000 mg | ORAL_TABLET | Freq: Two times a day (BID) | ORAL | 0 refills | Status: DC

## 2016-08-09 NOTE — ED Provider Notes (Signed)
[facility] Emergency Department Provider Note   ____________________________________________   First MD Initiated Contact with Patient 08/09/16 [phone]     (approximate)  I have reviewed the triage vital signs and the nursing notes.   HISTORY  Chief Complaint Urinary Retention    HPI Deveion Denz is a 83 y.o. male who presents to the ED from home with a chief complaint of urinary retention. Reports having right hip replacement 3 weeks ago and has had issues with urinary retention since. He has already had 2 prior Foley catheter since the surgery. Most recent catheter was removed or days ago. Reportsdifficulty voiding and feels as if his bladder is distended. He is not currently on antibiotic. Denies associated fever, chills, chest pain, shortness of breath, dysuria. Felt a little constipated and drank prune juice yesterday with very good effect and now having some diarrhea. Denies recent travel or trauma. Nothing makes his symptoms better or worse.   Past Medical History:  Diagnosis Date  . GERD (gastroesophageal reflux disease)   . Hypercholesteremia   . Stroke [facility])     Patient Active Problem List   Diagnosis Date Noted  . Tachycardia 04/20/2016    Past Surgical History:  Procedure Laterality Date  . TOTAL HIP ARTHROPLASTY Left     Prior to Admission medications   Medication Sig Start Date End Date Taking? Authorizing Provider  aspirin EC 81 MG tablet Take 81 mg by mouth daily.    Historical Provider, MD  atorvastatin (LIPITOR) 80 MG tablet Take 40 mg by mouth daily.    Historical Provider, MD  Cholecalciferol 1000 units TBDP Take 1 tablet by mouth daily.    Historical Provider, MD  ciprofloxacin (CIPRO) 500 MG tablet Take 1 tablet (500 mg total) by mouth 2 (two) times daily. 08/09/16   Irean Hong, MD  omeprazole (PRILOSEC) 20 MG capsule Take 20 mg by mouth daily.    Historical Provider, MD  terazosin (HYTRIN) 5 MG capsule Take 5 mg by  mouth at bedtime.    Historical Provider, MD    Allergies Patient has no known allergies.  Family History  Problem Relation Age of Onset  . Hypertension Father     Social History Social History  Substance Use Topics  . Smoking status: Never Smoker  . Smokeless tobacco: Never Used  . Alcohol use No    Review of Systems  Constitutional: No fever/chills. Eyes: No visual changes. ENT: No sore throat. Cardiovascular: Denies chest pain. Respiratory: Denies shortness of breath. Gastrointestinal: No abdominal pain.  No nausea, no vomiting.  No diarrhea.  No constipation. Genitourinary: Positive for urinary retention. Negative for dysuria. Musculoskeletal: Negative for back pain. Skin: Negative for rash. Neurological: Negative for headaches, focal weakness or numbness.  10-point ROS otherwise negative.  ____________________________________________   PHYSICAL EXAM:  VITAL SIGNS: ED Triage Vitals [08/09/16 0125]  Enc Vitals Group     BP (!) 133/44     Pulse Rate 81     Resp 20     Temp 97.6 F (36.4 C)     Temp Source Oral     SpO2 98 %     Weight 190 lb (86.2 kg)     Height 5\' 11"  (1.803 m)     Head Circumference      Peak Flow      Pain Score 10     Pain Loc      Pain Edu?      Excl. in GC?  Examined after Foley insertion: Constitutional: Alert and oriented. Well appearing and in no acute distress. Eyes: Conjunctivae are normal. PERRL. EOMI. Head: Atraumatic. Nose: No congestion/rhinnorhea. Mouth/Throat: Mucous membranes are moist.  Oropharynx non-erythematous. Neck: No stridor.   Cardiovascular: Normal rate, regular rhythm. Grossly normal heart sounds.  Good peripheral circulation. Respiratory: Normal respiratory effort.  No retractions. Lungs CTAB. Gastrointestinal: Soft and nontender to light or deep palpation. No distention. No abdominal bruits. No CVA tenderness. Musculoskeletal: Wound VAC to right hip incision.  No joint effusions. Neurologic:   Normal speech and language. No gross focal neurologic deficits are appreciated. No gait instability. Skin:  Skin is warm, dry and intact. No rash noted. Psychiatric: Mood and affect are normal. Speech and behavior are normal.  ____________________________________________   LABS (all labs ordered are listed, but only abnormal results are displayed)  Labs Reviewed  URINALYSIS, COMPLETE (UACMP) WITH MICROSCOPIC - Abnormal; Notable for the following:       Result Value   Color, Urine YELLOW (*)    APPearance CLEAR (*)    Squamous Epithelial / LPF 0-5 (*)    All other components within normal limits   ____________________________________________  EKG  None ____________________________________________  RADIOLOGY  None ____________________________________________   PROCEDURES  Procedure(s) performed: None  Procedures  Critical Care performed: No  ____________________________________________   INITIAL IMPRESSION / ASSESSMENT AND PLAN / ED COURSE  Pertinent labs & imaging results that were available during my care of the patient were reviewed by me and considered in my medical decision making (see chart for details).  83 year old male who presents with urinary retention in the setting of recent hip replacement 3 weeks ago. This is patient's third Foley catheter secondary to persistent urinary retention. He has an appointment with urology later this week. This morning he has an appointment with his surgeon to remove the wound VAC. Will discharge patient with Foley catheter in place, empiric antibiotic to prevent UTI. Strict return precautions given. Patient and his daughter verbalize understanding and agree with plan of care.  Clinical Course      ____________________________________________   FINAL CLINICAL IMPRESSION(S) / ED DIAGNOSES  Final diagnoses:  Urinary retention      NEW MEDICATIONS STARTED DURING THIS VISIT:  Discharge Medication List as of 08/09/2016   2:30 AM    START taking these medications   Details  ciprofloxacin (CIPRO) 500 MG tablet Take 1 tablet (500 mg total) by mouth 2 (two) times daily., Starting Wed 08/09/2016, Print         Note:  This document was prepared using Dragon voice recognition software and may include unintentional dictation errors.    Irean HongJade J Kierston Plasencia, MD 08/09/16 [phone]

## 2016-08-09 NOTE — ED Notes (Signed)
Pt uprite on stretcher in exam room with no distress noted; pt reports right THR 3wks ago; has had cath x 2 for urinary retention since; last cath removed on Friday at the [facility]; st cont to have difficulty voiding and feels as if bladder is distended; no abd tenderness with palpation noted; 10216fr foley cath inserted without difficulty using sterile technique; pt tolerated well and st relief of urinary urgency; daughter at bedside

## 2016-08-09 NOTE — ED Notes (Signed)
Foley drainage bag changed to leg bag; pt voices good understanding of foley care and bag changing as demonstrated by nurse and EDT

## 2016-08-09 NOTE — Discharge Instructions (Signed)
1. Take antibiotic as prescribed (Cipro 500 mg twice daily 7 days). 2. Return to the ER for worsening symptoms, persistent vomiting, difficulty breathing or other concerns.

## 2016-08-09 NOTE — ED Triage Notes (Signed)
Pt in with co urinary retention since yest, had hip replacement surgery done 3 weeks ago and had urinary retention since and catheter foley x 2. Had catheter removed on Friday.

## 2016-08-15 DIAGNOSIS — Z7901 Long term (current) use of anticoagulants: Secondary | ICD-10-CM | POA: Diagnosis not present

## 2016-08-15 DIAGNOSIS — T814XXD Infection following a procedure, subsequent encounter: Secondary | ICD-10-CM | POA: Diagnosis not present

## 2016-08-15 DIAGNOSIS — Z96641 Presence of right artificial hip joint: Secondary | ICD-10-CM | POA: Diagnosis not present

## 2016-08-15 DIAGNOSIS — I4891 Unspecified atrial fibrillation: Secondary | ICD-10-CM | POA: Diagnosis not present

## 2016-08-17 DIAGNOSIS — I4891 Unspecified atrial fibrillation: Secondary | ICD-10-CM | POA: Diagnosis not present

## 2016-08-17 DIAGNOSIS — Z7901 Long term (current) use of anticoagulants: Secondary | ICD-10-CM | POA: Diagnosis not present

## 2016-08-17 DIAGNOSIS — Z96641 Presence of right artificial hip joint: Secondary | ICD-10-CM | POA: Diagnosis not present

## 2016-08-17 DIAGNOSIS — T814XXD Infection following a procedure, subsequent encounter: Secondary | ICD-10-CM | POA: Diagnosis not present

## 2016-08-21 DIAGNOSIS — I4891 Unspecified atrial fibrillation: Secondary | ICD-10-CM | POA: Diagnosis not present

## 2016-08-21 DIAGNOSIS — Z7901 Long term (current) use of anticoagulants: Secondary | ICD-10-CM | POA: Diagnosis not present

## 2016-08-21 DIAGNOSIS — Z96641 Presence of right artificial hip joint: Secondary | ICD-10-CM | POA: Diagnosis not present

## 2016-08-21 DIAGNOSIS — T814XXD Infection following a procedure, subsequent encounter: Secondary | ICD-10-CM | POA: Diagnosis not present

## 2016-08-31 ENCOUNTER — Emergency Department
Admission: EM | Admit: 2016-08-31 | Discharge: 2016-08-31 | Disposition: A | Discharge status: Home / Self Care | Payer: Medicare Other | Attending: Emergency Medicine | Admitting: Emergency Medicine

## 2016-08-31 ENCOUNTER — Encounter: Payer: Self-pay | Admitting: *Deleted

## 2016-08-31 DIAGNOSIS — N3000 Acute cystitis without hematuria: Secondary | ICD-10-CM | POA: Diagnosis not present

## 2016-08-31 DIAGNOSIS — R509 Fever, unspecified: Secondary | ICD-10-CM | POA: Diagnosis present

## 2016-08-31 LAB — CBC WITH DIFFERENTIAL/PLATELET
Basophils Absolute: 0.1 10*3/uL (ref 0–0.1)
Basophils Relative: 0 %
EOS ABS: 0.1 10*3/uL (ref 0–0.7)
EOS PCT: 1 %
HCT: 32.8 % — ABNORMAL LOW (ref 40.0–52.0)
Hemoglobin: 11.1 g/dL — ABNORMAL LOW (ref 13.0–18.0)
LYMPHS ABS: 1.3 10*3/uL (ref 1.0–3.6)
Lymphocytes Relative: 9 %
MCH: 28.6 pg (ref 26.0–34.0)
MCHC: 34 g/dL (ref 32.0–36.0)
MCV: 84.1 fL (ref 80.0–100.0)
MONOS PCT: 11 %
Monocytes Absolute: 1.7 10*3/uL — ABNORMAL HIGH (ref 0.2–1.0)
Neutro Abs: 12.3 10*3/uL — ABNORMAL HIGH (ref 1.4–6.5)
Neutrophils Relative %: 79 %
PLATELETS: 367 10*3/uL (ref 150–440)
RBC: 3.9 MIL/uL — ABNORMAL LOW (ref 4.40–5.90)
RDW: 15.5 % — ABNORMAL HIGH (ref 11.5–14.5)
WBC: 15.5 10*3/uL — ABNORMAL HIGH (ref 3.8–10.6)

## 2016-08-31 LAB — BASIC METABOLIC PANEL
Anion gap: 10 (ref 5–15)
BUN: 21 mg/dL — ABNORMAL HIGH (ref 6–20)
CHLORIDE: 101 mmol/L (ref 101–111)
CO2: 23 mmol/L (ref 22–32)
CREATININE: 1.21 mg/dL (ref 0.61–1.24)
Calcium: 8.5 mg/dL — ABNORMAL LOW (ref 8.9–10.3)
GFR calc Af Amer: >60 mL/min (ref >60)
GFR, EST NON AFRICAN AMERICAN: 55 mL/min — AB (ref >60)
GLUCOSE: 100 mg/dL — AB (ref 65–99)
Potassium: 3.7 mmol/L (ref 3.5–5.1)
SODIUM: 134 mmol/L — AB (ref 135–145)

## 2016-08-31 LAB — URINALYSIS, COMPLETE (UACMP) WITH MICROSCOPIC
Bilirubin Urine: NEGATIVE
GLUCOSE, UA: NEGATIVE mg/dL
Ketones, ur: 5 mg/dL — AB
Nitrite: POSITIVE — AB
PROTEIN: 100 mg/dL — AB
Specific Gravity, Urine: 1.026 (ref 1.005–1.030)
pH: 5 (ref 5.0–8.0)

## 2016-08-31 LAB — INFLUENZA PANEL BY PCR (TYPE A & B)
INFLAPCR: NEGATIVE
INFLBPCR: NEGATIVE

## 2016-08-31 LAB — LACTIC ACID, PLASMA: LACTIC ACID, VENOUS: 0.9 mmol/L (ref 0.5–1.9)

## 2016-08-31 MED ORDER — LEVOFLOXACIN 750 MG PO TABS: 750.0000 mg | ORAL_TABLET | Freq: Every day | ORAL | 0 refills | Status: AC

## 2016-08-31 MED ORDER — LEVOFLOXACIN IN D5W 750 MG/150ML IV SOLN
750.0000 mg | Freq: Once | INTRAVENOUS | Status: AC
  Administered 2016-08-31: 750 mg via INTRAVENOUS
  Filled 2016-08-31: qty 150

## 2016-08-31 NOTE — Discharge Instructions (Signed)
Continue to take tylenol for fever if needed. Take the antibiotic until finished. Follow up with your urologist in about 10 days to make sure the infection is clear. Return to the ER for symptoms that change or worsen if you are unable to schedule an appointment.

## 2016-08-31 NOTE — ED Notes (Signed)
Per family he recently had hip replaced and currently has indwelling cath in place from the [facility]

## 2016-08-31 NOTE — ED Triage Notes (Signed)
Patient presents to ED via POV from home with c/o fever and "not feeling well". Patient denies CP, SOB.

## 2016-08-31 NOTE — ED Notes (Signed)
States he developed fever and body aches during the night  Has been weak today

## 2016-08-31 NOTE — ED Provider Notes (Signed)
[facility] Emergency Department Provider Note  ___________________________________________   First MD Initiated Contact with Patient 08/31/16 1612     (approximate)  I have reviewed the triage vital signs and the nursing notes.   HISTORY  Chief Complaint Fever   HPI [name] is a 83 y.o. male who presents to the emergency department for evaluation of fever, decreased appetite, and generalized malaise today.Daughter reports that he had a hip replacement about 2 months ago and has had some issue with urinary retention. He has an indwelling foley catheter that was replaced by his urologist at the [facility] 2 days ago. Last night and this morning he feels as though he has had a fever, although he was not able to actually check it with a thermometer. He has been taking tylenol and feels some better. Last dose of tylenol was around noon. He denies any URI symptoms or pain.   Past Medical History:  Diagnosis Date  . GERD (gastroesophageal reflux disease)   . Hypercholesteremia   . Stroke [facility])     Patient Active Problem List   Diagnosis Date Noted  . Tachycardia 04/20/2016    Past Surgical History:  Procedure Laterality Date  . TOTAL HIP ARTHROPLASTY Left     Prior to Admission medications   Medication Sig Start Date End Date Taking? Authorizing Provider  aspirin EC 81 MG tablet Take 81 mg by mouth daily.    Historical Provider, MD  atorvastatin (LIPITOR) 80 MG tablet Take 40 mg by mouth daily.    Historical Provider, MD  Cholecalciferol 1000 units TBDP Take 1 tablet by mouth daily.    Historical Provider, MD  ciprofloxacin (CIPRO) 500 MG tablet Take 1 tablet (500 mg total) by mouth 2 (two) times daily. 08/09/16   Irean Hong, MD  levofloxacin (LEVAQUIN) 750 MG tablet Take 1 tablet (750 mg total) by mouth daily. 08/31/16 09/05/16  Chinita Pester, FNP  omeprazole (PRILOSEC) 20 MG capsule Take 20 mg by mouth daily.    Historical Provider, MD    terazosin (HYTRIN) 5 MG capsule Take 5 mg by mouth at bedtime.    Historical Provider, MD    Allergies Patient has no known allergies.  Family History  Problem Relation Age of Onset  . Hypertension Father     Social History Social History  Substance Use Topics  . Smoking status: Never Smoker  . Smokeless tobacco: Never Used  . Alcohol use No    Review of Systems Constitutional: Positive for fever/chills Eyes: No visual changes. ENT: No sore throat. Cardiovascular: Denies chest pain. Respiratory: Denies shortness of breath. Gastrointestinal: No abdominal pain.  No nausea, no vomiting.  No diarrhea.  No constipation. Genitourinary: Negative for dysuria. Positive for indwelling foley catheter. Musculoskeletal: Negative for back pain. Skin: Negative for rash. Neurological: Negative for headaches, focal weakness or numbness. Psychiatric:Negative for change in mentation/cognition.  ____________________________________________   PHYSICAL EXAM:  VITAL SIGNS: ED Triage Vitals  Enc Vitals Group     BP 08/31/16 1549 (!) 107/53     Pulse --      Resp 08/31/16 1549 17     Temp 08/31/16 1549 98.3 F (36.8 C)     Temp Source 08/31/16 1549 Oral     SpO2 08/31/16 1549 97 %     Weight 08/31/16 1551 190 lb (86.2 kg)     Height 08/31/16 1551 5\' 11"  (1.803 m)     Head Circumference --      Peak  Flow --      Pain Score 08/31/16 1552 0     Pain Loc --      Pain Edu? --      Excl. in GC? --     Constitutional: Alert and oriented. Well appearing and in no acute distress. Eyes: Conjunctivae are normal. PERRL. EOMI. Head: Atraumatic. Nose: No congestion/rhinnorhea. Mouth/Throat: Mucous membranes are moist.  Oropharynx non-erythematous. Neck: No stridor.   Cardiovascular: Normal rate, regular rhythm. Grossly normal heart sounds.  Good peripheral circulation. Respiratory: Normal respiratory effort.  No retractions. Lungs CTAB. Gastrointestinal: Soft and nontender. No  distention. No abdominal bruits. No CVA tenderness. Musculoskeletal: No lower extremity tenderness nor edema.  No joint effusions. Neurologic:  Normal speech and language. No gross focal neurologic deficits are appreciated. No gait instability. Skin:  Skin is warm, dry and intact. No rash noted. Psychiatric: Mood and affect are normal. Speech and behavior are normal.  ____________________________________________   LABS (all labs ordered are listed, but only abnormal results are displayed)  Labs Reviewed  CBC WITH DIFFERENTIAL/PLATELET - Abnormal; Notable for the following:       Result Value   WBC 15.5 (*)    RBC 3.90 (*)    Hemoglobin 11.1 (*)    HCT 32.8 (*)    RDW 15.5 (*)    Neutro Abs 12.3 (*)    Monocytes Absolute 1.7 (*)    All other components within normal limits  BASIC METABOLIC PANEL - Abnormal; Notable for the following:    Sodium 134 (*)    Glucose, Bld 100 (*)    BUN 21 (*)    Calcium 8.5 (*)    GFR calc non Af Amer 55 (*)    All other components within normal limits  URINALYSIS, COMPLETE (UACMP) WITH MICROSCOPIC - Abnormal; Notable for the following:    Color, Urine AMBER (*)    APPearance CLOUDY (*)    Hgb urine dipstick SMALL (*)    Ketones, ur 5 (*)    Protein, ur 100 (*)    Nitrite POSITIVE (*)    Leukocytes, UA LARGE (*)    Bacteria, UA RARE (*)    Squamous Epithelial / LPF 0-5 (*)    All other components within normal limits  URINE CULTURE  INFLUENZA PANEL BY PCR (TYPE A & B)  LACTIC ACID, PLASMA  LACTIC ACID, PLASMA   ____________________________________________  EKG   ____________________________________________  RADIOLOGY  Not indicated ____________________________________________   PROCEDURES  Procedure(s) performed: None  Procedures  Critical Care performed: No  ____________________________________________   INITIAL IMPRESSION / ASSESSMENT AND PLAN / ED COURSE  83 year old gentleman presenting to the emergency department  for general malaise and fever last night and today. Concern for urinary tract infection due to indwelling foley catheter. Will also check for influenza. Daughter at bedside.  Urinalysis shows significant urinary tract infection. He was given IV Levaquin while in the emergency department tonight and will be given a prescription for the same. He has a follow-up appointment already scheduled with orthopedics as well as urology. He is instructed to keep the appointments as scheduled. He was encouraged to continue to take the Tylenol if needed for pain or fever. He is instructed to return to the emergency department for symptoms that change or worsen if he is unable schedule an appointment.  Pertinent labs & imaging results that were available during my care of the patient were reviewed by me and considered in my medical decision making (see chart  for details).        ____________________________________________   FINAL CLINICAL IMPRESSION(S) / ED DIAGNOSES  Final diagnoses:  Acute cystitis without hematuria      NEW MEDICATIONS STARTED DURING THIS VISIT:  Discharge Medication List as of 08/31/2016  6:15 PM    START taking these medications   Details  levofloxacin (LEVAQUIN) 750 MG tablet Take 1 tablet (750 mg total) by mouth daily., Starting Thu 08/31/2016, Until Tue 09/05/2016, Print         Note:  This document was prepared using Dragon voice recognition software and may include unintentional dictation errors.    Chinita Pester, FNP 08/31/16 2204    Charlynne Pander, MD 09/03/16 2101

## 2016-08-31 NOTE — ED Notes (Signed)
D&C IV 

## 2016-08-31 NOTE — ED Notes (Signed)
No protocols at this time per verbal from Dr. Derrill KayGoodman. Flex appropriate, verified with CB.

## 2016-08-31 NOTE — ED Notes (Signed)
Pt given warm blanket.

## 2016-09-03 LAB — URINE CULTURE: Culture: >=100000 — AB

## 2016-09-04 NOTE — Progress Notes (Signed)
ED Antimicrobial Stewardship Positive Culture Follow Up   Gregory BailGeorge Thomas [name] is an 83 y.o. male who presented to [facility] on 08/31/2016 with a chief complaint of  Chief Complaint  Patient presents with  . Fever    Recent Results (from the past 720 hour(s))  Urine culture     Status: Abnormal   Collection Time: 08/31/16  4:32 PM  Result Value Ref Range Status   Specimen Description URINE, CATHETERIZED  Final   Special Requests NONE  Final   Culture >=100,000 COLONIES/mL ESCHERICHIA COLI (A)  Final   Report Status 09/03/2016 FINAL  Final   Organism ID, Bacteria ESCHERICHIA COLI (A)  Final      Susceptibility   Escherichia coli - MIC*    AMPICILLIN >=32 RESISTANT Resistant     CEFAZOLIN 8 SENSITIVE Sensitive     CEFTRIAXONE <=1 SENSITIVE Sensitive     CIPROFLOXACIN >=4 RESISTANT Resistant     GENTAMICIN >=16 RESISTANT Resistant     IMIPENEM <=0.25 SENSITIVE Sensitive     NITROFURANTOIN <=16 SENSITIVE Sensitive     TRIMETH/SULFA >=320 RESISTANT Resistant     AMPICILLIN/SULBACTAM >=32 RESISTANT Resistant     PIP/TAZO 16 SENSITIVE Sensitive     Extended ESBL NEGATIVE Sensitive     * >=100,000 COLONIES/mL ESCHERICHIA COLI    [x]  Treated with levofloxacin, organism resistant to prescribed antimicrobial  New antibiotic prescription: cephalexin 500 mg PO BID x 7 days  ED Provider: Dr. Harvie BridgeKinner  Spoke with patient and explained results. Instructed patient to stop taking levofloxacin and start taking cephalexin. Patient reported NKDA. Prescription called into [facility] in [place], [place].  Cindi CarbonMary M Ainara Eldridge, PharmD, BCPS 09/04/2016, 12:59 PM

## 2016-12-28 DIAGNOSIS — E785 Hyperlipidemia, unspecified: Secondary | ICD-10-CM | POA: Diagnosis not present

## 2016-12-28 DIAGNOSIS — I10 Essential (primary) hypertension: Secondary | ICD-10-CM | POA: Diagnosis not present

## 2016-12-28 DIAGNOSIS — F329 Major depressive disorder, single episode, unspecified: Secondary | ICD-10-CM | POA: Diagnosis not present

## 2016-12-28 DIAGNOSIS — G4730 Sleep apnea, unspecified: Secondary | ICD-10-CM | POA: Diagnosis not present

## 2016-12-28 DIAGNOSIS — N401 Enlarged prostate with lower urinary tract symptoms: Secondary | ICD-10-CM | POA: Diagnosis not present

## 2016-12-28 DIAGNOSIS — G629 Polyneuropathy, unspecified: Secondary | ICD-10-CM | POA: Diagnosis not present

## 2016-12-28 DIAGNOSIS — I69398 Other sequelae of cerebral infarction: Secondary | ICD-10-CM | POA: Diagnosis not present

## 2016-12-28 DIAGNOSIS — M160 Bilateral primary osteoarthritis of hip: Secondary | ICD-10-CM | POA: Diagnosis not present

## 2016-12-28 DIAGNOSIS — R339 Retention of urine, unspecified: Secondary | ICD-10-CM | POA: Diagnosis not present

## 2016-12-28 DIAGNOSIS — I480 Paroxysmal atrial fibrillation: Secondary | ICD-10-CM | POA: Diagnosis not present

## 2016-12-28 DIAGNOSIS — I69322 Dysarthria following cerebral infarction: Secondary | ICD-10-CM | POA: Diagnosis not present

## 2016-12-28 DIAGNOSIS — G3184 Mild cognitive impairment, so stated: Secondary | ICD-10-CM | POA: Diagnosis not present

## 2016-12-28 DIAGNOSIS — R531 Weakness: Secondary | ICD-10-CM | POA: Diagnosis not present

## 2016-12-28 DIAGNOSIS — Z9181 History of falling: Secondary | ICD-10-CM | POA: Diagnosis not present

## 2016-12-28 DIAGNOSIS — K219 Gastro-esophageal reflux disease without esophagitis: Secondary | ICD-10-CM | POA: Diagnosis not present

## 2017-01-03 DIAGNOSIS — I69322 Dysarthria following cerebral infarction: Secondary | ICD-10-CM | POA: Diagnosis not present

## 2017-01-03 DIAGNOSIS — I10 Essential (primary) hypertension: Secondary | ICD-10-CM | POA: Diagnosis not present

## 2017-01-03 DIAGNOSIS — I69398 Other sequelae of cerebral infarction: Secondary | ICD-10-CM | POA: Diagnosis not present

## 2017-01-03 DIAGNOSIS — G3184 Mild cognitive impairment, so stated: Secondary | ICD-10-CM | POA: Diagnosis not present

## 2017-01-03 DIAGNOSIS — R531 Weakness: Secondary | ICD-10-CM | POA: Diagnosis not present

## 2017-01-03 DIAGNOSIS — I480 Paroxysmal atrial fibrillation: Secondary | ICD-10-CM | POA: Diagnosis not present

## 2017-01-04 DIAGNOSIS — I10 Essential (primary) hypertension: Secondary | ICD-10-CM | POA: Diagnosis not present

## 2017-01-04 DIAGNOSIS — G3184 Mild cognitive impairment, so stated: Secondary | ICD-10-CM | POA: Diagnosis not present

## 2017-01-04 DIAGNOSIS — I69322 Dysarthria following cerebral infarction: Secondary | ICD-10-CM | POA: Diagnosis not present

## 2017-01-04 DIAGNOSIS — I69398 Other sequelae of cerebral infarction: Secondary | ICD-10-CM | POA: Diagnosis not present

## 2017-01-04 DIAGNOSIS — I480 Paroxysmal atrial fibrillation: Secondary | ICD-10-CM | POA: Diagnosis not present

## 2017-01-04 DIAGNOSIS — R531 Weakness: Secondary | ICD-10-CM | POA: Diagnosis not present

## 2017-01-06 DIAGNOSIS — I69322 Dysarthria following cerebral infarction: Secondary | ICD-10-CM | POA: Diagnosis not present

## 2017-01-06 DIAGNOSIS — G3184 Mild cognitive impairment, so stated: Secondary | ICD-10-CM | POA: Diagnosis not present

## 2017-01-06 DIAGNOSIS — I480 Paroxysmal atrial fibrillation: Secondary | ICD-10-CM | POA: Diagnosis not present

## 2017-01-06 DIAGNOSIS — I10 Essential (primary) hypertension: Secondary | ICD-10-CM | POA: Diagnosis not present

## 2017-01-06 DIAGNOSIS — I69398 Other sequelae of cerebral infarction: Secondary | ICD-10-CM | POA: Diagnosis not present

## 2017-01-06 DIAGNOSIS — R531 Weakness: Secondary | ICD-10-CM | POA: Diagnosis not present

## 2017-01-09 DIAGNOSIS — I480 Paroxysmal atrial fibrillation: Secondary | ICD-10-CM | POA: Diagnosis not present

## 2017-01-09 DIAGNOSIS — G3184 Mild cognitive impairment, so stated: Secondary | ICD-10-CM | POA: Diagnosis not present

## 2017-01-09 DIAGNOSIS — I69398 Other sequelae of cerebral infarction: Secondary | ICD-10-CM | POA: Diagnosis not present

## 2017-01-09 DIAGNOSIS — I10 Essential (primary) hypertension: Secondary | ICD-10-CM | POA: Diagnosis not present

## 2017-01-09 DIAGNOSIS — R531 Weakness: Secondary | ICD-10-CM | POA: Diagnosis not present

## 2017-01-09 DIAGNOSIS — I69322 Dysarthria following cerebral infarction: Secondary | ICD-10-CM | POA: Diagnosis not present

## 2017-01-10 DIAGNOSIS — R531 Weakness: Secondary | ICD-10-CM | POA: Diagnosis not present

## 2017-01-10 DIAGNOSIS — I69398 Other sequelae of cerebral infarction: Secondary | ICD-10-CM | POA: Diagnosis not present

## 2017-01-10 DIAGNOSIS — I10 Essential (primary) hypertension: Secondary | ICD-10-CM | POA: Diagnosis not present

## 2017-01-10 DIAGNOSIS — G3184 Mild cognitive impairment, so stated: Secondary | ICD-10-CM | POA: Diagnosis not present

## 2017-01-10 DIAGNOSIS — I480 Paroxysmal atrial fibrillation: Secondary | ICD-10-CM | POA: Diagnosis not present

## 2017-01-10 DIAGNOSIS — I69322 Dysarthria following cerebral infarction: Secondary | ICD-10-CM | POA: Diagnosis not present

## 2017-01-11 DIAGNOSIS — R531 Weakness: Secondary | ICD-10-CM | POA: Diagnosis not present

## 2017-01-11 DIAGNOSIS — I69398 Other sequelae of cerebral infarction: Secondary | ICD-10-CM | POA: Diagnosis not present

## 2017-01-11 DIAGNOSIS — G3184 Mild cognitive impairment, so stated: Secondary | ICD-10-CM | POA: Diagnosis not present

## 2017-01-11 DIAGNOSIS — I69322 Dysarthria following cerebral infarction: Secondary | ICD-10-CM | POA: Diagnosis not present

## 2017-01-11 DIAGNOSIS — I480 Paroxysmal atrial fibrillation: Secondary | ICD-10-CM | POA: Diagnosis not present

## 2017-01-11 DIAGNOSIS — I10 Essential (primary) hypertension: Secondary | ICD-10-CM | POA: Diagnosis not present

## 2017-01-15 DIAGNOSIS — G3184 Mild cognitive impairment, so stated: Secondary | ICD-10-CM | POA: Diagnosis not present

## 2017-01-15 DIAGNOSIS — R531 Weakness: Secondary | ICD-10-CM | POA: Diagnosis not present

## 2017-01-15 DIAGNOSIS — I69398 Other sequelae of cerebral infarction: Secondary | ICD-10-CM | POA: Diagnosis not present

## 2017-01-15 DIAGNOSIS — I69322 Dysarthria following cerebral infarction: Secondary | ICD-10-CM | POA: Diagnosis not present

## 2017-01-15 DIAGNOSIS — I10 Essential (primary) hypertension: Secondary | ICD-10-CM | POA: Diagnosis not present

## 2017-01-15 DIAGNOSIS — I480 Paroxysmal atrial fibrillation: Secondary | ICD-10-CM | POA: Diagnosis not present

## 2017-01-16 DIAGNOSIS — I69322 Dysarthria following cerebral infarction: Secondary | ICD-10-CM | POA: Diagnosis not present

## 2017-01-16 DIAGNOSIS — R531 Weakness: Secondary | ICD-10-CM | POA: Diagnosis not present

## 2017-01-16 DIAGNOSIS — I480 Paroxysmal atrial fibrillation: Secondary | ICD-10-CM | POA: Diagnosis not present

## 2017-01-16 DIAGNOSIS — I69398 Other sequelae of cerebral infarction: Secondary | ICD-10-CM | POA: Diagnosis not present

## 2017-01-16 DIAGNOSIS — G3184 Mild cognitive impairment, so stated: Secondary | ICD-10-CM | POA: Diagnosis not present

## 2017-01-16 DIAGNOSIS — I10 Essential (primary) hypertension: Secondary | ICD-10-CM | POA: Diagnosis not present

## 2017-01-17 DIAGNOSIS — G3184 Mild cognitive impairment, so stated: Secondary | ICD-10-CM | POA: Diagnosis not present

## 2017-01-17 DIAGNOSIS — I69398 Other sequelae of cerebral infarction: Secondary | ICD-10-CM | POA: Diagnosis not present

## 2017-01-17 DIAGNOSIS — I69322 Dysarthria following cerebral infarction: Secondary | ICD-10-CM | POA: Diagnosis not present

## 2017-01-17 DIAGNOSIS — I480 Paroxysmal atrial fibrillation: Secondary | ICD-10-CM | POA: Diagnosis not present

## 2017-01-17 DIAGNOSIS — I10 Essential (primary) hypertension: Secondary | ICD-10-CM | POA: Diagnosis not present

## 2017-01-17 DIAGNOSIS — R531 Weakness: Secondary | ICD-10-CM | POA: Diagnosis not present

## 2017-01-22 DIAGNOSIS — I69322 Dysarthria following cerebral infarction: Secondary | ICD-10-CM | POA: Diagnosis not present

## 2017-01-22 DIAGNOSIS — I10 Essential (primary) hypertension: Secondary | ICD-10-CM | POA: Diagnosis not present

## 2017-01-22 DIAGNOSIS — R531 Weakness: Secondary | ICD-10-CM | POA: Diagnosis not present

## 2017-01-22 DIAGNOSIS — I69398 Other sequelae of cerebral infarction: Secondary | ICD-10-CM | POA: Diagnosis not present

## 2017-01-22 DIAGNOSIS — I480 Paroxysmal atrial fibrillation: Secondary | ICD-10-CM | POA: Diagnosis not present

## 2017-01-22 DIAGNOSIS — G3184 Mild cognitive impairment, so stated: Secondary | ICD-10-CM | POA: Diagnosis not present

## 2017-01-23 DIAGNOSIS — I480 Paroxysmal atrial fibrillation: Secondary | ICD-10-CM | POA: Diagnosis not present

## 2017-01-23 DIAGNOSIS — I69322 Dysarthria following cerebral infarction: Secondary | ICD-10-CM | POA: Diagnosis not present

## 2017-01-23 DIAGNOSIS — I69398 Other sequelae of cerebral infarction: Secondary | ICD-10-CM | POA: Diagnosis not present

## 2017-01-23 DIAGNOSIS — I10 Essential (primary) hypertension: Secondary | ICD-10-CM | POA: Diagnosis not present

## 2017-01-23 DIAGNOSIS — R531 Weakness: Secondary | ICD-10-CM | POA: Diagnosis not present

## 2017-01-23 DIAGNOSIS — G3184 Mild cognitive impairment, so stated: Secondary | ICD-10-CM | POA: Diagnosis not present

## 2017-01-24 DIAGNOSIS — R531 Weakness: Secondary | ICD-10-CM | POA: Diagnosis not present

## 2017-01-24 DIAGNOSIS — I69398 Other sequelae of cerebral infarction: Secondary | ICD-10-CM | POA: Diagnosis not present

## 2017-01-24 DIAGNOSIS — I10 Essential (primary) hypertension: Secondary | ICD-10-CM | POA: Diagnosis not present

## 2017-01-24 DIAGNOSIS — G3184 Mild cognitive impairment, so stated: Secondary | ICD-10-CM | POA: Diagnosis not present

## 2017-01-24 DIAGNOSIS — I69322 Dysarthria following cerebral infarction: Secondary | ICD-10-CM | POA: Diagnosis not present

## 2017-01-24 DIAGNOSIS — I480 Paroxysmal atrial fibrillation: Secondary | ICD-10-CM | POA: Diagnosis not present

## 2017-01-30 DIAGNOSIS — G3184 Mild cognitive impairment, so stated: Secondary | ICD-10-CM | POA: Diagnosis not present

## 2017-01-30 DIAGNOSIS — I69322 Dysarthria following cerebral infarction: Secondary | ICD-10-CM | POA: Diagnosis not present

## 2017-01-30 DIAGNOSIS — I480 Paroxysmal atrial fibrillation: Secondary | ICD-10-CM | POA: Diagnosis not present

## 2017-01-30 DIAGNOSIS — R531 Weakness: Secondary | ICD-10-CM | POA: Diagnosis not present

## 2017-01-30 DIAGNOSIS — I69398 Other sequelae of cerebral infarction: Secondary | ICD-10-CM | POA: Diagnosis not present

## 2017-01-30 DIAGNOSIS — I10 Essential (primary) hypertension: Secondary | ICD-10-CM | POA: Diagnosis not present

## 2017-02-02 DIAGNOSIS — I69322 Dysarthria following cerebral infarction: Secondary | ICD-10-CM | POA: Diagnosis not present

## 2017-02-02 DIAGNOSIS — I69398 Other sequelae of cerebral infarction: Secondary | ICD-10-CM | POA: Diagnosis not present

## 2017-02-02 DIAGNOSIS — R531 Weakness: Secondary | ICD-10-CM | POA: Diagnosis not present

## 2017-02-02 DIAGNOSIS — I10 Essential (primary) hypertension: Secondary | ICD-10-CM | POA: Diagnosis not present

## 2017-02-02 DIAGNOSIS — G3184 Mild cognitive impairment, so stated: Secondary | ICD-10-CM | POA: Diagnosis not present

## 2017-02-02 DIAGNOSIS — I480 Paroxysmal atrial fibrillation: Secondary | ICD-10-CM | POA: Diagnosis not present

## 2017-02-13 ENCOUNTER — Inpatient Hospital Stay: Payer: Medicare Other

## 2017-02-13 ENCOUNTER — Emergency Department: Payer: Medicare Other

## 2017-02-13 ENCOUNTER — Inpatient Hospital Stay
Admission: EM | Admit: 2017-02-13 | Discharge: 2017-02-15 | DRG: 064 | Disposition: A | Discharge status: Rehabilitation Facility | Payer: Medicare Other | Attending: Internal Medicine | Admitting: Internal Medicine

## 2017-02-13 DIAGNOSIS — S199XXA Unspecified injury of neck, initial encounter: Secondary | ICD-10-CM | POA: Diagnosis not present

## 2017-02-13 DIAGNOSIS — N452 Orchitis: Secondary | ICD-10-CM | POA: Diagnosis present

## 2017-02-13 DIAGNOSIS — E785 Hyperlipidemia, unspecified: Secondary | ICD-10-CM | POA: Diagnosis present

## 2017-02-13 DIAGNOSIS — I63511 Cerebral infarction due to unspecified occlusion or stenosis of right middle cerebral artery: Secondary | ICD-10-CM | POA: Diagnosis not present

## 2017-02-13 DIAGNOSIS — Z79899 Other long term (current) drug therapy: Secondary | ICD-10-CM | POA: Diagnosis not present

## 2017-02-13 DIAGNOSIS — E7800 Pure hypercholesterolemia, unspecified: Secondary | ICD-10-CM | POA: Diagnosis present

## 2017-02-13 DIAGNOSIS — R609 Edema, unspecified: Secondary | ICD-10-CM | POA: Diagnosis not present

## 2017-02-13 DIAGNOSIS — N433 Hydrocele, unspecified: Secondary | ICD-10-CM | POA: Diagnosis not present

## 2017-02-13 DIAGNOSIS — W1830XA Fall on same level, unspecified, initial encounter: Secondary | ICD-10-CM | POA: Diagnosis present

## 2017-02-13 DIAGNOSIS — R413 Other amnesia: Secondary | ICD-10-CM | POA: Diagnosis present

## 2017-02-13 DIAGNOSIS — N3000 Acute cystitis without hematuria: Secondary | ICD-10-CM

## 2017-02-13 DIAGNOSIS — F329 Major depressive disorder, single episode, unspecified: Secondary | ICD-10-CM | POA: Diagnosis present

## 2017-02-13 DIAGNOSIS — R4182 Altered mental status, unspecified: Secondary | ICD-10-CM | POA: Diagnosis not present

## 2017-02-13 DIAGNOSIS — H538 Other visual disturbances: Secondary | ICD-10-CM | POA: Diagnosis present

## 2017-02-13 DIAGNOSIS — M542 Cervicalgia: Secondary | ICD-10-CM | POA: Diagnosis present

## 2017-02-13 DIAGNOSIS — Z66 Do not resuscitate: Secondary | ICD-10-CM | POA: Diagnosis present

## 2017-02-13 DIAGNOSIS — A4151 Sepsis due to Escherichia coli [E. coli]: Secondary | ICD-10-CM | POA: Diagnosis present

## 2017-02-13 DIAGNOSIS — I959 Hypotension, unspecified: Secondary | ICD-10-CM | POA: Diagnosis present

## 2017-02-13 DIAGNOSIS — S0003XA Contusion of scalp, initial encounter: Secondary | ICD-10-CM | POA: Diagnosis present

## 2017-02-13 DIAGNOSIS — R269 Unspecified abnormalities of gait and mobility: Secondary | ICD-10-CM | POA: Diagnosis present

## 2017-02-13 DIAGNOSIS — R29701 NIHSS score 1: Secondary | ICD-10-CM | POA: Diagnosis present

## 2017-02-13 DIAGNOSIS — I6521 Occlusion and stenosis of right carotid artery: Secondary | ICD-10-CM | POA: Diagnosis not present

## 2017-02-13 DIAGNOSIS — Z7982 Long term (current) use of aspirin: Secondary | ICD-10-CM

## 2017-02-13 DIAGNOSIS — R4189 Other symptoms and signs involving cognitive functions and awareness: Secondary | ICD-10-CM | POA: Diagnosis present

## 2017-02-13 DIAGNOSIS — H548 Legal blindness, as defined in USA: Secondary | ICD-10-CM | POA: Diagnosis present

## 2017-02-13 DIAGNOSIS — R29706 NIHSS score 6: Secondary | ICD-10-CM | POA: Diagnosis present

## 2017-02-13 DIAGNOSIS — H5462 Unqualified visual loss, left eye, normal vision right eye: Secondary | ICD-10-CM | POA: Diagnosis present

## 2017-02-13 DIAGNOSIS — Z7901 Long term (current) use of anticoagulants: Secondary | ICD-10-CM

## 2017-02-13 DIAGNOSIS — N400 Enlarged prostate without lower urinary tract symptoms: Secondary | ICD-10-CM | POA: Diagnosis present

## 2017-02-13 DIAGNOSIS — K219 Gastro-esophageal reflux disease without esophagitis: Secondary | ICD-10-CM | POA: Diagnosis present

## 2017-02-13 DIAGNOSIS — N390 Urinary tract infection, site not specified: Secondary | ICD-10-CM | POA: Diagnosis not present

## 2017-02-13 DIAGNOSIS — B9620 Unspecified Escherichia coli [E. coli] as the cause of diseases classified elsewhere: Secondary | ICD-10-CM | POA: Diagnosis present

## 2017-02-13 DIAGNOSIS — I4891 Unspecified atrial fibrillation: Secondary | ICD-10-CM | POA: Diagnosis present

## 2017-02-13 DIAGNOSIS — N50819 Testicular pain, unspecified: Secondary | ICD-10-CM | POA: Diagnosis not present

## 2017-02-13 DIAGNOSIS — I639 Cerebral infarction, unspecified: Secondary | ICD-10-CM

## 2017-02-13 DIAGNOSIS — N50811 Right testicular pain: Secondary | ICD-10-CM | POA: Diagnosis not present

## 2017-02-13 DIAGNOSIS — Z96642 Presence of left artificial hip joint: Secondary | ICD-10-CM | POA: Diagnosis present

## 2017-02-13 DIAGNOSIS — Y92009 Unspecified place in unspecified non-institutional (private) residence as the place of occurrence of the external cause: Secondary | ICD-10-CM | POA: Diagnosis not present

## 2017-02-13 DIAGNOSIS — N453 Epididymo-orchitis: Secondary | ICD-10-CM | POA: Diagnosis not present

## 2017-02-13 DIAGNOSIS — G9341 Metabolic encephalopathy: Secondary | ICD-10-CM | POA: Diagnosis present

## 2017-02-13 DIAGNOSIS — Z8249 Family history of ischemic heart disease and other diseases of the circulatory system: Secondary | ICD-10-CM

## 2017-02-13 DIAGNOSIS — N3001 Acute cystitis with hematuria: Secondary | ICD-10-CM | POA: Diagnosis present

## 2017-02-13 DIAGNOSIS — G459 Transient cerebral ischemic attack, unspecified: Secondary | ICD-10-CM | POA: Diagnosis not present

## 2017-02-13 DIAGNOSIS — A419 Sepsis, unspecified organism: Secondary | ICD-10-CM | POA: Diagnosis not present

## 2017-02-13 DIAGNOSIS — S0990XA Unspecified injury of head, initial encounter: Secondary | ICD-10-CM | POA: Diagnosis not present

## 2017-02-13 DIAGNOSIS — Z8673 Personal history of transient ischemic attack (TIA), and cerebral infarction without residual deficits: Secondary | ICD-10-CM

## 2017-02-13 DIAGNOSIS — R112 Nausea with vomiting, unspecified: Secondary | ICD-10-CM | POA: Diagnosis not present

## 2017-02-13 DIAGNOSIS — G919 Hydrocephalus, unspecified: Secondary | ICD-10-CM | POA: Diagnosis present

## 2017-02-13 DIAGNOSIS — R42 Dizziness and giddiness: Secondary | ICD-10-CM | POA: Diagnosis not present

## 2017-02-13 HISTORY — DX: Unspecified atrial fibrillation: I48.91

## 2017-02-13 LAB — CBC WITH DIFFERENTIAL/PLATELET
BASOS ABS: 0.1 10*3/uL (ref 0–0.1)
BASOS PCT: 0 %
EOS ABS: 0.1 10*3/uL (ref 0–0.7)
Eosinophils Relative: 1 %
HCT: 38.5 % — ABNORMAL LOW (ref 40.0–52.0)
HEMOGLOBIN: 13.1 g/dL (ref 13.0–18.0)
LYMPHS ABS: 0.8 10*3/uL — AB (ref 1.0–3.6)
Lymphocytes Relative: 6 %
MCH: 29.3 pg (ref 26.0–34.0)
MCHC: 34 g/dL (ref 32.0–36.0)
MCV: 86.1 fL (ref 80.0–100.0)
Monocytes Absolute: 1.1 10*3/uL — ABNORMAL HIGH (ref 0.2–1.0)
Monocytes Relative: 8 %
NEUTROS PCT: 85 %
Neutro Abs: 10.9 10*3/uL — ABNORMAL HIGH (ref 1.4–6.5)
PLATELETS: 246 10*3/uL (ref 150–440)
RBC: 4.47 MIL/uL (ref 4.40–5.90)
RDW: 17.6 % — ABNORMAL HIGH (ref 11.5–14.5)
WBC: 13 10*3/uL — AB (ref 3.8–10.6)

## 2017-02-13 LAB — URINALYSIS, COMPLETE (UACMP) WITH MICROSCOPIC
BILIRUBIN URINE: NEGATIVE
Glucose, UA: NEGATIVE mg/dL
Ketones, ur: NEGATIVE mg/dL
NITRITE: NEGATIVE
PH: 6 (ref 5.0–8.0)
Protein, ur: NEGATIVE mg/dL
SPECIFIC GRAVITY, URINE: 1.017 (ref 1.005–1.030)

## 2017-02-13 LAB — PROTIME-INR
INR: 1.04
PROTHROMBIN TIME: 13.6 s (ref 11.4–15.2)

## 2017-02-13 LAB — COMPREHENSIVE METABOLIC PANEL
ALT: 28 U/L (ref 17–63)
AST: 28 U/L (ref 15–41)
Albumin: 3.8 g/dL (ref 3.5–5.0)
Alkaline Phosphatase: 79 U/L (ref 38–126)
Anion gap: 6 (ref 5–15)
BILIRUBIN TOTAL: 0.7 mg/dL (ref 0.3–1.2)
BUN: 14 mg/dL (ref 6–20)
CHLORIDE: 105 mmol/L (ref 101–111)
CO2: 27 mmol/L (ref 22–32)
CREATININE: 0.92 mg/dL (ref 0.61–1.24)
Calcium: 8.9 mg/dL (ref 8.9–10.3)
Glucose, Bld: 124 mg/dL — ABNORMAL HIGH (ref 65–99)
Potassium: 4.2 mmol/L (ref 3.5–5.1)
Sodium: 138 mmol/L (ref 135–145)
TOTAL PROTEIN: 7.1 g/dL (ref 6.5–8.1)

## 2017-02-13 LAB — URINE DRUG SCREEN, QUALITATIVE (ARMC ONLY)
Amphetamines, Ur Screen: NOT DETECTED
BARBITURATES, UR SCREEN: NOT DETECTED
BENZODIAZEPINE, UR SCRN: NOT DETECTED
CANNABINOID 50 NG, UR ~~LOC~~: NOT DETECTED
Cocaine Metabolite,Ur ~~LOC~~: NOT DETECTED
MDMA (Ecstasy)Ur Screen: NOT DETECTED
METHADONE SCREEN, URINE: NOT DETECTED
Opiate, Ur Screen: NOT DETECTED
Phencyclidine (PCP) Ur S: NOT DETECTED
TRICYCLIC, UR SCREEN: NOT DETECTED

## 2017-02-13 LAB — TROPONIN I

## 2017-02-13 LAB — APTT: APTT: 33 s (ref 24–36)

## 2017-02-13 MED ORDER — DOCUSATE SODIUM 100 MG PO CAPS
100.0000 mg | ORAL_CAPSULE | Freq: Every day | ORAL | Status: DC | PRN
  Administered 2017-02-15: 100 mg via ORAL
  Filled 2017-02-13: qty 1

## 2017-02-13 MED ORDER — CYCLOBENZAPRINE HCL 10 MG PO TABS
5.0000 mg | ORAL_TABLET | Freq: Three times a day (TID) | ORAL | Status: DC | PRN
  Administered 2017-02-15: 10:00:00 5 mg via ORAL
  Filled 2017-02-13: qty 1

## 2017-02-13 MED ORDER — ONDANSETRON HCL 4 MG/2ML IJ SOLN: 4.0000 mg | Freq: Four times a day (QID) | INTRAMUSCULAR | Status: DC | PRN

## 2017-02-13 MED ORDER — ASPIRIN EC 81 MG PO TBEC
81.0000 mg | DELAYED_RELEASE_TABLET | Freq: Every day | ORAL | Status: DC
  Administered 2017-02-13 – 2017-02-15 (×3): 81 mg via ORAL
  Filled 2017-02-13 (×4): qty 1

## 2017-02-13 MED ORDER — MORPHINE SULFATE (PF) 2 MG/ML IV SOLN
1.0000 mg | INTRAVENOUS | Status: AC
  Administered 2017-02-13: 1 mg via INTRAVENOUS
  Filled 2017-02-13: qty 1

## 2017-02-13 MED ORDER — DEXTROSE 5 % IV SOLN
1.0000 g | INTRAVENOUS | Status: DC
  Filled 2017-02-13: qty 10

## 2017-02-13 MED ORDER — FINASTERIDE 5 MG PO TABS
5.0000 mg | ORAL_TABLET | Freq: Every day | ORAL | Status: DC
  Administered 2017-02-13 – 2017-02-15 (×3): 5 mg via ORAL
  Filled 2017-02-13 (×3): qty 1

## 2017-02-13 MED ORDER — PANTOPRAZOLE SODIUM 40 MG PO TBEC
40.0000 mg | DELAYED_RELEASE_TABLET | Freq: Every day | ORAL | Status: DC
  Administered 2017-02-13 – 2017-02-15 (×3): 40 mg via ORAL
  Filled 2017-02-13 (×3): qty 1

## 2017-02-13 MED ORDER — ONDANSETRON HCL 4 MG/2ML IJ SOLN
INTRAMUSCULAR | Status: AC
  Administered 2017-02-13: 4 mg via INTRAVENOUS
  Filled 2017-02-13: qty 2

## 2017-02-13 MED ORDER — SERTRALINE HCL 50 MG PO TABS
25.0000 mg | ORAL_TABLET | Freq: Every day | ORAL | Status: DC
  Administered 2017-02-13 – 2017-02-15 (×3): 25 mg via ORAL
  Filled 2017-02-13 (×3): qty 1

## 2017-02-13 MED ORDER — ACETAMINOPHEN 325 MG PO TABS
ORAL_TABLET | ORAL | Status: AC
  Administered 2017-02-13: 650 mg via ORAL
  Filled 2017-02-13: qty 2

## 2017-02-13 MED ORDER — DEXTROSE 5 % IV SOLN
1.0000 g | INTRAVENOUS | Status: DC
  Administered 2017-02-14: 1 g via INTRAVENOUS
  Filled 2017-02-13 (×2): qty 10

## 2017-02-13 MED ORDER — ONDANSETRON HCL 4 MG PO TABS: 4.0000 mg | ORAL_TABLET | Freq: Four times a day (QID) | ORAL | Status: DC | PRN

## 2017-02-13 MED ORDER — VITAMIN D 1000 UNITS PO TABS
1000.0000 [IU] | ORAL_TABLET | Freq: Every day | ORAL | Status: DC
  Administered 2017-02-13 – 2017-02-15 (×3): 1000 [IU] via ORAL
  Filled 2017-02-13 (×3): qty 1

## 2017-02-13 MED ORDER — ATORVASTATIN CALCIUM 20 MG PO TABS
80.0000 mg | ORAL_TABLET | Freq: Every day | ORAL | Status: DC
  Administered 2017-02-13 – 2017-02-15 (×3): 80 mg via ORAL
  Filled 2017-02-13 (×4): qty 4

## 2017-02-13 MED ORDER — TRAMADOL HCL 50 MG PO TABS
50.0000 mg | ORAL_TABLET | Freq: Four times a day (QID) | ORAL | Status: DC | PRN
  Administered 2017-02-13 – 2017-02-15 (×3): 50 mg via ORAL
  Filled 2017-02-13 (×3): qty 1

## 2017-02-13 MED ORDER — IOPAMIDOL (ISOVUE-370) INJECTION 76%
75.0000 mL | Freq: Once | INTRAVENOUS | Status: AC | PRN
  Administered 2017-02-13: 75 mL via INTRAVENOUS

## 2017-02-13 MED ORDER — ACETAMINOPHEN 325 MG PO TABS
650.0000 mg | ORAL_TABLET | Freq: Four times a day (QID) | ORAL | Status: DC | PRN
  Administered 2017-02-13: 650 mg via ORAL

## 2017-02-13 MED ORDER — APIXABAN 5 MG PO TABS
5.0000 mg | ORAL_TABLET | Freq: Two times a day (BID) | ORAL | Status: DC
  Administered 2017-02-13 – 2017-02-15 (×4): 5 mg via ORAL
  Filled 2017-02-13 (×4): qty 1

## 2017-02-13 MED ORDER — ACETAMINOPHEN 325 MG PO TABS
ORAL_TABLET | ORAL | Status: AC
  Filled 2017-02-13: qty 2

## 2017-02-13 MED ORDER — ACETAMINOPHEN 325 MG PO TABS: 650.0000 mg | ORAL_TABLET | Freq: Once | ORAL | Status: DC

## 2017-02-13 MED ORDER — CEFTRIAXONE SODIUM 1 G IJ SOLR
1.0000 g | Freq: Once | INTRAMUSCULAR | Status: AC
  Administered 2017-02-13: 1 g via INTRAVENOUS
  Filled 2017-02-13: qty 10

## 2017-02-13 MED ORDER — FERROUS SULFATE 325 (65 FE) MG PO TABS
325.0000 mg | ORAL_TABLET | Freq: Every day | ORAL | Status: DC
  Administered 2017-02-13 – 2017-02-15 (×3): 325 mg via ORAL
  Filled 2017-02-13 (×3): qty 1

## 2017-02-13 MED ORDER — ACETAMINOPHEN 650 MG RE SUPP: 650.0000 mg | Freq: Four times a day (QID) | RECTAL | Status: DC | PRN

## 2017-02-13 MED ORDER — ONDANSETRON HCL 4 MG/2ML IJ SOLN
4.0000 mg | Freq: Once | INTRAMUSCULAR | Status: AC
  Administered 2017-02-13: 4 mg via INTRAVENOUS

## 2017-02-13 MED ORDER — TAMSULOSIN HCL 0.4 MG PO CAPS
0.4000 mg | ORAL_CAPSULE | Freq: Every day | ORAL | Status: DC
  Administered 2017-02-13: 0.4 mg via ORAL
  Filled 2017-02-13: qty 1

## 2017-02-13 NOTE — H&P (Signed)
[facility] [place] [facility]   PATIENT NAME: [name]    MR#:  [id number]  DATE OF BIRTH:  March 07, 1934  DATE OF ADMISSION:  02/13/2017  PRIMARY CARE PHYSICIAN: [facility]   REQUESTING/REFERRING PHYSICIAN: Dr. Jene Every  CHIEF COMPLAINT:   Chief Complaint  Patient presents with  . Altered Mental Status    HISTORY OF PRESENT ILLNESS:  [name]  is a 83 y.o. 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 fib on eliquis, GERD as well as to hospital secondary to confusion and fall today. Patient was known to be completely normal yesterday according to his son. This morning patient woke up, he felt normal. But he was confused later in the morning when he went to use the bathroom but didn't know how to use it and passed out. He does not remember anything clearly, but was able to call 911. He was still very confused when he presented to emergency room. Has some shaking chills at this time. He denies any syncopal episode in the bathroom. He had an episode of near syncope here in the emergency room while sitting on a commode. CT of the head and CT angiogram did not reveal any acute findings. Code stroke was called initially, appreciate neurology input. However labs revealed UTI. Patient has been on Epic sick than and aspirin for his A. fib and small vessel disease. Most recent stroke was 2 months ago according to family. No focal neurological deficits noted at this time. Complaints of significant neck pain. CT C-spine without any acute findings.  PAST MEDICAL HISTORY:   Past Medical History:  Diagnosis Date  . A-fib (HCC)   . GERD (gastroesophageal reflux disease)   . Hypercholesteremia   . Stroke [facility])     PAST SURGICAL HISTORY:   Past Surgical History:  Procedure Laterality Date  . TOTAL HIP ARTHROPLASTY Left     SOCIAL HISTORY:   Social History  Substance Use Topics  . Smoking  status: Never Smoker  . Smokeless tobacco: Never Used  . Alcohol use No    FAMILY HISTORY:   Family History  Problem Relation Age of Onset  . Hypertension Father     DRUG ALLERGIES:  No Known Allergies  REVIEW OF SYSTEMS:   Review of Systems  Constitutional: Positive for malaise/fatigue. Negative for fever and weight loss.  HENT: Positive for hearing loss. Negative for ear discharge, ear pain, nosebleeds and tinnitus.   Eyes: Negative for blurred vision, double vision and photophobia.  Respiratory: Negative for cough, hemoptysis, shortness of breath and wheezing.   Cardiovascular: Negative for chest pain, palpitations, orthopnea and leg swelling.  Gastrointestinal: Positive for nausea. Negative for abdominal pain, constipation, diarrhea, heartburn, melena and vomiting.  Genitourinary: Positive for urgency. Negative for dysuria and frequency.  Musculoskeletal: Positive for falls, myalgias and neck pain. Negative for back pain.  Skin: Negative for rash.  Neurological: Negative for dizziness, tingling, tremors, sensory change, speech change, focal weakness and headaches.       Syncope Confusion  Endo/Heme/Allergies: Does not bruise/bleed easily.  Psychiatric/Behavioral: Negative for depression.    MEDICATIONS AT HOME:   Prior to Admission medications   Medication Sig Start Date End Date Taking? Authorizing Provider  aspirin EC 81 MG tablet Take 81 mg by mouth daily.   Yes [provider]  atorvastatin (LIPITOR) 80 MG tablet Take 80 mg by mouth daily.    Yes [provider]  Cholecalciferol 1000 units TBDP Take 1 tablet by mouth daily.   Yes [provider]  ferrous sulfate 325 (65 FE) MG tablet Take 325 mg by mouth daily with breakfast.   Yes [provider]  finasteride (PROSCAR) 5 MG tablet Take 5 mg by mouth daily.   Yes [provider]  omeprazole (PRILOSEC) 20 MG capsule Take 20 mg by mouth daily.   Yes [provider]   sertraline (ZOLOFT) 25 MG tablet Take 25 mg by mouth daily.   Yes [provider]  tamsulosin (FLOMAX) 0.4 MG CAPS capsule Take 0.4 mg by mouth.   Yes [provider]  ciprofloxacin (CIPRO) 500 MG tablet Take 1 tablet (500 mg total) by mouth 2 (two) times daily. 08/09/16   Irean Hong, MD  docusate sodium (COLACE) 100 MG capsule Take 100 mg by mouth daily as needed for mild constipation.    [provider]      VITAL SIGNS:  Blood pressure (!) 158/65, pulse 75, temperature 97.9 F (36.6 C), resp. rate (!) 22, SpO2 98 %.  PHYSICAL EXAMINATION:   Physical Exam  GENERAL:  83 y.o.-year-old elderly patient lying in the bed, complaining of neck pain.  EYES: Pupils equal, round, reactive to light and accommodation. No scleral icterus. Extraocular muscles intact.  HEENT: Head normocephalic. Some bleeding from an abrasion on right forehead from trauma. Oropharynx and nasopharynx clear.  NECK:  Supple, no jugular venous distention. No thyroid enlargement, paraspinal neck muscles pain, no stiffness  LUNGS: Normal breath sounds bilaterally, no wheezing, rales,rhonchi or crepitation. No use of accessory muscles of respiration. Decreased bibasilar breath sounds CARDIOVASCULAR: S1, S2 normal. No  rubs, or gallops. 3/6 systolic murmur is present ABDOMEN: Soft, nontender, nondistended. Bowel sounds present. No organomegaly or mass.  EXTREMITIES: No pedal edema, cyanosis, or clubbing.  NEUROLOGIC: Cranial nerves II through XII are intact. Muscle strength 5/5 in all extremities. Sensation intact. Gait not checked.  PSYCHIATRIC: The patient is alert and oriented x 2. Disoriented to time and some cognitive deficits noted. SKIN: No obvious rash, lesion, or ulcer.   LABORATORY PANEL:   CBC  Recent Labs Lab 02/13/17 0908  WBC 13.0*  HGB 13.1  HCT 38.5*  PLT 246    ------------------------------------------------------------------------------------------------------------------  Chemistries   Recent Labs Lab 02/13/17 0908  NA 138  K 4.2  CL 105  CO2 27  GLUCOSE 124*  BUN 14  CREATININE 0.92  CALCIUM 8.9  AST 28  ALT 28  ALKPHOS 79  BILITOT 0.7   ------------------------------------------------------------------------------------------------------------------  Cardiac Enzymes  Recent Labs Lab 02/13/17 0908  TROPONINI <0.03   ------------------------------------------------------------------------------------------------------------------  RADIOLOGY:  Ct Angio Head W Or Wo Contrast  Result Date: 02/13/2017 CLINICAL DATA:  Sudden onset nausea and vomiting. EXAM: CT ANGIOGRAPHY HEAD AND NECK TECHNIQUE: Multidetector CT imaging of the head and neck was performed using the standard protocol during bolus administration of intravenous contrast. Multiplanar CT image reconstructions and MIPs were obtained to evaluate the vascular anatomy. Carotid stenosis measurements (when applicable) are obtained utilizing NASCET criteria, using the distal internal carotid diameter as the denominator. CONTRAST:  75 cc Isovue 370 intravenous COMPARISON:  Head CT from earlier today.  CTA neck 04/15/2009 FINDINGS: CTA NECK FINDINGS Aortic arch: Normal diameter. No acute finding. Atherosclerotic calcification. Three vessel branching. Right carotid system: Moderate calcified and noncalcified plaque at the common carotid bifurcation and ICA bulb without stenosis or ulceration. Negative for dissection. Left carotid system: Previous endarterectomy - left ICA  stenosis and complex plaque seen previously is resolved and there is left neck scarring. Vessels are smooth and widely patent. Vertebral arteries: No brachiocephalic or proximal subclavian stenosis. The dominant left vertebral artery is smooth and diffusely patent to the dura. The non dominant right vertebral artery  has a severe stenosis at the origin, with non visible lumen at this level. The vessel is smaller than prior and is subtly asymmetrically less dense today, attributed to underfilling. No suspected dissection. Skeleton: No acute or aggressive finding. Other neck: No incidental mass or adenopathy. Upper chest: No acute finding. Review of the MIP images confirms the above findings CTA HEAD FINDINGS Anterior circulation: Symmetric carotid arteries. Moderate atherosclerotic plaque on the carotid siphons. No major branch occlusion or significant and reversible stenosis. No beading or aneurysm. Posterior circulation: Small less intensely opacified right vertebral artery with probable mid V4 segment stenosis. A right pica is not clearly seen, but stable from 2010. There is a large right AICA. Small distal basilar in the setting of fetal type PCA circulation. Venous sinuses: Patent Anatomic variants: Fetal type PCA. Delayed phase: No abnormal intracranial enhancement. Remote left MCA territory infarct. Review of the MIP images confirms the above findings IMPRESSION: 1. No emergent large vessel occlusion. 2. 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 3. Widely patent left carotid endarterectomy. 4. Moderate atherosclerotic plaque at the right cervical carotid bifurcation without flow limiting stenosis. Electronically Signed   By: Marnee Spring M.D.   On: 02/13/2017 10:46   Ct Head Wo Contrast  Result Date: 02/13/2017 CLINICAL DATA:  Unwitnessed fall.  Nausea and vomiting EXAM: CT HEAD WITHOUT CONTRAST CT CERVICAL SPINE WITHOUT CONTRAST TECHNIQUE: Multidetector CT imaging of the head and cervical spine was performed following the standard protocol without intravenous contrast. Multiplanar CT image reconstructions of the cervical spine were also generated. COMPARISON:  Head CT April 20, 2016 FINDINGS: CT HEAD FINDINGS Brain: There is mild diffuse atrophy,  stable. There is no intracranial mass, hemorrhage, extra-axial fluid collection, or midline shift. There is evidence of a prior infarct in the left parietal lobe, stable. There is also evidence of a prior infarct in the superior posterior left frontal lobe near the junction with the left parietal lobe, stable. There is patchy small vessel disease throughout the centra semiovale bilaterally. There is evidence of a prior small lacunar infarct in the posterior left lentiform nucleus. There is note new gray-white compartment lesion. No acute infarct evident. Vascular: There is no appreciable hyperdense vessel. There is mild calcification in the left carotid siphon. Skull: The bony calvarium appears intact. There is a small right frontal scalp hematoma. Sinuses/Orbits: There is opacification of multiple ethmoid air cells bilaterally. There is mucosal thickening in the inferior frontal sinus regions. There is a concha bullosa on each side, an anatomic variant. Other paranasal sinuses are clear. Orbits appear symmetric bilaterally. Note that there is preseptal soft tissue swelling over the right eye. Other: There is mild mastoid air cell disease on the right inferiorly. Mastoids elsewhere are clear. CT CERVICAL SPINE FINDINGS Alignment: There is no appreciable spondylolisthesis. Skull base and vertebrae: Skull base and craniocervical junction regions appear within normal limits. There is no demonstrable fracture. There are no blastic or lytic bone lesions. Soft tissues and spinal canal: Prevertebral soft tissues and predental space regions are normal. There is no paraspinous lesion. There is no cord or canal hematoma. Disc levels: There is predental space narrowing and spur formation consistent  with localized osteoarthritic change. There is moderate disc space narrowing at C6-7. There is facet hypertrophy at multiple levels. There is no frank nerve root effacement. No disc extrusion or stenosis evident. Upper chest:  Visualized upper lung regions clear. Other: There is calcification in both subclavian arteries. There is calcification in the right carotid artery. IMPRESSION: CT head: Atrophy with supratentorial small vessel disease. Prior infarcts in the posterior left frontal lobe and left parietal lobe regions. Small prior infarct left lentiform nucleus. No acute infarct evident. No mass, hemorrhage, or extra-axial fluid collection. There is ethmoid sinus disease with opacification of multiple ethmoid sinuses. There is also mild inferior frontal sinus disease bilaterally. There is mild inferior left mastoid disease. There is a right frontal scalp hematoma. No fracture evident. There is preseptal soft tissue edema over the right eye. CT cervical spine: No evident fracture or spondylolisthesis. Osteoarthritic change noted at several levels. Areas of arterial vascular calcification noted. Critical Value/emergent results were called by telephone at the time of interpretation on 02/13/2017 at 9:43 am to Dr. Jene EveryOBERT KINNER , who verbally acknowledged these results. Electronically Signed   By: Bretta BangWilliam  Woodruff III M.D.   On: 02/13/2017 09:44   Ct Angio Neck W Or Wo Contrast  Result Date: 02/13/2017 CLINICAL DATA:  Sudden onset nausea and vomiting. EXAM: CT ANGIOGRAPHY HEAD AND NECK TECHNIQUE: Multidetector CT imaging of the head and neck was performed using the standard protocol during bolus administration of intravenous contrast. Multiplanar CT image reconstructions and MIPs were obtained to evaluate the vascular anatomy. Carotid stenosis measurements (when applicable) are obtained utilizing NASCET criteria, using the distal internal carotid diameter as the denominator. CONTRAST:  75 cc Isovue 370 intravenous COMPARISON:  Head CT from earlier today.  CTA neck 04/15/2009 FINDINGS: CTA NECK FINDINGS Aortic arch: Normal diameter. No acute finding. Atherosclerotic calcification. Three vessel branching. Right carotid system: Moderate  calcified and noncalcified plaque at the common carotid bifurcation and ICA bulb without stenosis or ulceration. Negative for dissection. Left carotid system: Previous endarterectomy - left ICA stenosis and complex plaque seen previously is resolved and there is left neck scarring. Vessels are smooth and widely patent. Vertebral arteries: No brachiocephalic or proximal subclavian stenosis. The dominant left vertebral artery is smooth and diffusely patent to the dura. The non dominant right vertebral artery has a severe stenosis at the origin, with non visible lumen at this level. The vessel is smaller than prior and is subtly asymmetrically less dense today, attributed to underfilling. No suspected dissection. Skeleton: No acute or aggressive finding. Other neck: No incidental mass or adenopathy. Upper chest: No acute finding. Review of the MIP images confirms the above findings CTA HEAD FINDINGS Anterior circulation: Symmetric carotid arteries. Moderate atherosclerotic plaque on the carotid siphons. No major branch occlusion or significant and reversible stenosis. No beading or aneurysm. Posterior circulation: Small less intensely opacified right vertebral artery with probable mid V4 segment stenosis. A right pica is not clearly seen, but stable from 2010. There is a large right AICA. Small distal basilar in the setting of fetal type PCA circulation. Venous sinuses: Patent Anatomic variants: Fetal type PCA. Delayed phase: No abnormal intracranial enhancement. Remote left MCA territory infarct. Review of the MIP images confirms the above findings IMPRESSION: 1. No emergent large vessel occlusion. 2. 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 3. Widely patent left carotid endarterectomy. 4. Moderate atherosclerotic plaque at the right cervical carotid bifurcation without flow limiting stenosis. Electronically  Signed   By: Marnee Spring M.D.   On:  02/13/2017 10:46   Ct Cervical Spine Wo Contrast  Result Date: 02/13/2017 CLINICAL DATA:  Unwitnessed fall.  Nausea and vomiting EXAM: CT HEAD WITHOUT CONTRAST CT CERVICAL SPINE WITHOUT CONTRAST TECHNIQUE: Multidetector CT imaging of the head and cervical spine was performed following the standard protocol without intravenous contrast. Multiplanar CT image reconstructions of the cervical spine were also generated. COMPARISON:  Head CT April 20, 2016 FINDINGS: CT HEAD FINDINGS Brain: There is mild diffuse atrophy, stable. There is no intracranial mass, hemorrhage, extra-axial fluid collection, or midline shift. There is evidence of a prior infarct in the left parietal lobe, stable. There is also evidence of a prior infarct in the superior posterior left frontal lobe near the junction with the left parietal lobe, stable. There is patchy small vessel disease throughout the centra semiovale bilaterally. There is evidence of a prior small lacunar infarct in the posterior left lentiform nucleus. There is note new gray-white compartment lesion. No acute infarct evident. Vascular: There is no appreciable hyperdense vessel. There is mild calcification in the left carotid siphon. Skull: The bony calvarium appears intact. There is a small right frontal scalp hematoma. Sinuses/Orbits: There is opacification of multiple ethmoid air cells bilaterally. There is mucosal thickening in the inferior frontal sinus regions. There is a concha bullosa on each side, an anatomic variant. Other paranasal sinuses are clear. Orbits appear symmetric bilaterally. Note that there is preseptal soft tissue swelling over the right eye. Other: There is mild mastoid air cell disease on the right inferiorly. Mastoids elsewhere are clear. CT CERVICAL SPINE FINDINGS Alignment: There is no appreciable spondylolisthesis. Skull base and vertebrae: Skull base and craniocervical junction regions appear within normal limits. There is no demonstrable  fracture. There are no blastic or lytic bone lesions. Soft tissues and spinal canal: Prevertebral soft tissues and predental space regions are normal. There is no paraspinous lesion. There is no cord or canal hematoma. Disc levels: There is predental space narrowing and spur formation consistent with localized osteoarthritic change. There is moderate disc space narrowing at C6-7. There is facet hypertrophy at multiple levels. There is no frank nerve root effacement. No disc extrusion or stenosis evident. Upper chest: Visualized upper lung regions clear. Other: There is calcification in both subclavian arteries. There is calcification in the right carotid artery. IMPRESSION: CT head: Atrophy with supratentorial small vessel disease. Prior infarcts in the posterior left frontal lobe and left parietal lobe regions. Small prior infarct left lentiform nucleus. No acute infarct evident. No mass, hemorrhage, or extra-axial fluid collection. There is ethmoid sinus disease with opacification of multiple ethmoid sinuses. There is also mild inferior frontal sinus disease bilaterally. There is mild inferior left mastoid disease. There is a right frontal scalp hematoma. No fracture evident. There is preseptal soft tissue edema over the right eye. CT cervical spine: No evident fracture or spondylolisthesis. Osteoarthritic change noted at several levels. Areas of arterial vascular calcification noted. Critical Value/emergent results were called by telephone at the time of interpretation on 02/13/2017 at 9:43 am to Dr. Jene Every , who verbally acknowledged these results. Electronically Signed   By: Bretta Bang III M.D.   On: 02/13/2017 09:44   US Scrotum  Result Date: 02/13/2017 CLINICAL DATA:  Testicular swelling and pain. EXAM: SCROTAL ULTRASOUND DOPPLER ULTRASOUND OF THE TESTICLES TECHNIQUE: Complete ultrasound examination of the testicles, epididymis, and other scrotal structures was performed. Color and spectral  Doppler ultrasound were also utilized  to evaluate blood flow to the testicles. COMPARISON:  None. FINDINGS: Right testicle Measurements: 4.8 x 3.1 x 2.9 cm. No mass or microlithiasis visualized. Increased vascularity is noted on Doppler suggesting orchitis. Left testicle Measurements: 4.9 x 2.5 x 2.2 cm. No mass or microlithiasis visualized. Right epididymis:  4 mm cyst is noted. Left epididymis:  4 mm cyst is noted. Hydrocele:  Mild right septated hydrocele is noted. Varicocele:  None visualized. Pulsed Doppler interrogation of both testes demonstrates normal low resistance arterial and venous waveforms bilaterally. IMPRESSION: Increased vascularity of right testicle is noted suggesting orchitis. Mild septated right hydrocele is noted as well. Electronically Signed   By: Lupita Raider, M.D.   On: 02/13/2017 12:25   [place] Art/ven Flow Abd Pelv Doppler  Result Date: 02/13/2017 CLINICAL DATA:  Testicular swelling and pain. EXAM: SCROTAL ULTRASOUND DOPPLER ULTRASOUND OF THE TESTICLES TECHNIQUE: Complete ultrasound examination of the testicles, epididymis, and other scrotal structures was performed. Color and spectral Doppler ultrasound were also utilized to evaluate blood flow to the testicles. COMPARISON:  None. FINDINGS: Right testicle Measurements: 4.8 x 3.1 x 2.9 cm. No mass or microlithiasis visualized. Increased vascularity is noted on Doppler suggesting orchitis. Left testicle Measurements: 4.9 x 2.5 x 2.2 cm. No mass or microlithiasis visualized. Right epididymis:  4 mm cyst is noted. Left epididymis:  4 mm cyst is noted. Hydrocele:  Mild right septated hydrocele is noted. Varicocele:  None visualized. Pulsed Doppler interrogation of both testes demonstrates normal low resistance arterial and venous waveforms bilaterally. IMPRESSION: Increased vascularity of right testicle is noted suggesting orchitis. Mild septated right hydrocele is noted as well. Electronically Signed   By: Lupita Raider, M.D.   On:  02/13/2017 12:25    EKG:   Orders placed or performed during the hospital encounter of 02/13/17  . ED EKG  . ED EKG  . EKG 12-Lead  . EKG 12-Lead  . EKG 12-Lead  . EKG 12-Lead    IMPRESSION AND PLAN:   [name]  is a 83 y.o. 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 fib on eliquis, GERD as well as to hospital secondary to confusion and fall today.  #1 acute metabolic encephalopathy-could be related to underlying UTI. -Admit, neurochecks. Blood and urine cultures -Started on Rocephin.  #2 possible TIA-could also be TIA versus from infection. -Appreciate neurology consult. Patient was incontinent after the fall. No witnesses to see if he had a seizure-like episode. -Continue with MRI of the brain. EEG ordered. -Continue eliquis and aspirin and statin. -PT/OT consults. -Agree with neuro checks. -Muscle relaxants to help with the neck pain.  #3 GERD-continue Protonix  #4 BPH-continue home medications.  #5 A. fib-rate controlled. Continue eliquis.  #6 DVT prophylaxis-already on eliquis   All the records are reviewed and case discussed with ED provider. Management plans discussed with the patient, family and they are in agreement.  CODE STATUS: Full Code  TOTAL TIME TAKING CARE OF THIS PATIENT: 50 minutes.    Enid Baas M.D on 02/13/2017 at 2:23 PM  Between 7am to 6pm - Pager - [phone]  After 6pm go to [email or web address] - Social research officer, government  [facility]  Office  [phone]  CC: Primary care physician; [facility]

## 2017-02-13 NOTE — Consult Note (Signed)
Referring Physician: Cyril Loosen    Chief Complaint: Difficulty with vision, confusion  HPI: [name] is an 83 y.o. male with a history of atrial fibrillation on Apixaban/ASA and recent infarct who presents after awakening at baseline today.  History after that is somewhat sketchy since patient seems to be amnestic of some of the events.  Drank some water and afterward became nauseous and vomited.  Has to use the bathroom but could not place himself on the toilet correctly. Fell.  Was able to get to the phone but could not see all of the numbers.  Was eventually able to call 911 and was brought in for evaluation.  Patient reports that he defecated on the floor and may have urinated as well.  There is no evidence of tongue biting.    Initial NIHSS of 1.  Date last known well: Date: 02/13/2017 Time last known well: Time: 07:00 tPA Given: No: On Apixaban, recent infarct  Past Medical History:  Diagnosis Date  . GERD (gastroesophageal reflux disease)   . Hypercholesteremia   . Stroke [facility])     Past Surgical History:  Procedure Laterality Date  . TOTAL HIP ARTHROPLASTY Left     Family History  Problem Relation Age of Onset  . Hypertension Father    Social History:  reports that he has never smoked. He has never used smokeless tobacco. He reports that he does not drink alcohol or use drugs.  Allergies: No Known Allergies  Medications: I have reviewed the patient's current medications. Prior to Admission:  Prior to Admission medications   Medication Sig Start Date End Date Taking? Authorizing Provider  aspirin EC 81 MG tablet Take 81 mg by mouth daily.   Yes [provider]  atorvastatin (LIPITOR) 80 MG tablet Take 80 mg by mouth daily.    Yes [provider]  Cholecalciferol 1000 units TBDP Take 1 tablet by mouth daily.   Yes [provider]  ferrous sulfate 325 (65 FE) MG tablet Take 325 mg by mouth daily with breakfast.   Yes [provider]  finasteride (PROSCAR) 5 MG tablet Take 5 mg by mouth daily.   Yes [provider]  omeprazole (PRILOSEC) 20 MG capsule Take 20 mg by mouth daily.   Yes [provider]  sertraline (ZOLOFT) 25 MG tablet Take 25 mg by mouth daily.   Yes [provider]  tamsulosin (FLOMAX) 0.4 MG CAPS capsule Take 0.4 mg by mouth.   Yes [provider]  ciprofloxacin (CIPRO) 500 MG tablet Take 1 tablet (500 mg total) by mouth 2 (two) times daily. 08/09/16   Irean Hong, MD  docusate sodium (COLACE) 100 MG capsule Take 100 mg by mouth daily as needed for mild constipation.    [provider]     ROS: History obtained from the patient  General ROS: negative for - chills, fatigue, fever, night sweats, weight gain or weight loss Psychological ROS: depression Ophthalmic ROS: as noted in HPI ENT ROS: negative for - epistaxis, nasal discharge, oral lesions, sore throat, tinnitus or vertigo Allergy and Immunology ROS: negative for - hives or itchy/watery eyes Hematological and Lymphatic ROS: negative for - bleeding problems, bruising or swollen lymph nodes Endocrine ROS: negative for - galactorrhea, hair pattern changes, polydipsia/polyuria or temperature intolerance Respiratory ROS: negative for - cough, hemoptysis, shortness of breath or wheezing Cardiovascular ROS: negative for - chest pain, dyspnea on exertion, edema or irregular heartbeat Gastrointestinal ROS: as noted in HPI Genito-Urinary ROS:  negative for - dysuria, hematuria, incontinence or urinary frequency/urgency Musculoskeletal ROS: negative for - joint swelling or muscular weakness Neurological ROS: as noted in HPI Dermatological ROS: negative for rash and skin lesion changes  Physical Examination: Blood pressure 105/79, pulse 62, temperature 97.9 F (36.6 C), resp. rate 17, SpO2 100 %.  HEENT-  Right forehead hematoma.  Normal external eye and conjunctiva.  Normal TM's bilaterally.  Normal auditory  canals and external ears. Normal external nose, mucus membranes and septum.  Normal pharynx. Cardiovascular- S1, S2 normal, pulses palpable throughout   Lungs- chest clear, no wheezing, rales, normal symmetric air entry Abdomen- soft, non-tender; bowel sounds normal; no masses,  no organomegaly Extremities- no edema Lymph-no adenopathy palpable Musculoskeletal-no joint tenderness, deformity or swelling Skin-warm and dry, no hyperpigmentation, vitiligo, or suspicious lesions  Neurological Examination   Mental Status: Alert, oriented, thought content appropriate.  Speech fluent without evidence of aphasia.  Able to follow 3 step commands without some reinforcement. Cranial Nerves: II: Discs flat bilaterally; Left partial field cut, pupils equal, round, reactive to light and accommodation III,IV, VI: ptosis not present, extra-ocular motions intact bilaterally V,VII: smile symmetric, facial light touch sensation normal bilaterally VIII: hearing normal bilaterally IX,X: gag reflex present XI: bilateral shoulder shrug XII: midline tongue extension Motor: Right : Upper extremity   5/5    Left:     Upper extremity   5/5  Lower extremity   5/5     Lower extremity   5/5 Tone and bulk:normal tone throughout; no atrophy noted Sensory: Pinprick and light touch intact throughout, bilaterally Deep Tendon Reflexes: 2+ and symmetric with absent AJ's Plantars: Right:mute   Left: mute Cerebellar: Normal finger-to-nose and normal heel-to-shin testing bilaterally Gait: not tested due to safety concerns    Laboratory Studies:  Basic Metabolic Panel: No results for input(s): NA, K, CL, CO2, GLUCOSE, BUN, CREATININE, CALCIUM, MG, PHOS in the last 168 hours.  Liver Function Tests: No results for input(s): AST, ALT, ALKPHOS, BILITOT, PROT, ALBUMIN in the last 168 hours. No results for input(s): LIPASE, AMYLASE in the last 168 hours. No results for input(s): AMMONIA in the last 168  hours.  CBC:  Recent Labs Lab 02/13/17 0908  WBC 13.0*  NEUTROABS 10.9*  HGB 13.1  HCT 38.5*  MCV 86.1  PLT 246    Cardiac Enzymes: No results for input(s): CKTOTAL, CKMB, CKMBINDEX, TROPONINI in the last 168 hours.  BNP: Invalid input(s): POCBNP  CBG: No results for input(s): GLUCAP in the last 168 hours.  Microbiology: Results for orders placed or performed during the hospital encounter of 08/31/16  Urine culture     Status: Abnormal   Collection Time: 08/31/16  4:32 PM  Result Value Ref Range Status   Specimen Description URINE, CATHETERIZED  Final   Special Requests NONE  Final   Culture >=100,000 COLONIES/mL ESCHERICHIA COLI (A)  Final   Report Status 09/03/2016 FINAL  Final   Organism ID, Bacteria ESCHERICHIA COLI (A)  Final      Susceptibility   Escherichia coli - MIC*    AMPICILLIN >=32 RESISTANT Resistant     CEFAZOLIN 8 SENSITIVE Sensitive     CEFTRIAXONE <=1 SENSITIVE Sensitive     CIPROFLOXACIN >=4 RESISTANT Resistant     GENTAMICIN >=16 RESISTANT Resistant     IMIPENEM <=0.25 SENSITIVE Sensitive     NITROFURANTOIN <=16 SENSITIVE Sensitive     TRIMETH/SULFA >=320 RESISTANT Resistant     AMPICILLIN/SULBACTAM >=32 RESISTANT Resistant     PIP/TAZO 16  SENSITIVE Sensitive     Extended ESBL NEGATIVE Sensitive     * >=100,000 COLONIES/mL ESCHERICHIA COLI    Coagulation Studies:  Recent Labs  02/13/17 0908  LABPROT 13.6  INR 1.04    Urinalysis: No results for input(s): COLORURINE, LABSPEC, PHURINE, GLUCOSEU, HGBUR, BILIRUBINUR, KETONESUR, PROTEINUR, UROBILINOGEN, NITRITE, LEUKOCYTESUR in the last 168 hours.  Invalid input(s): APPERANCEUR  Lipid Panel: No results found for: CHOL, TRIG, HDL, CHOLHDL, VLDL, LDLCALC  HgbA1C: No results found for: HGBA1C  Urine Drug Screen:  No results found for: LABOPIA, COCAINSCRNUR, LABBENZ, AMPHETMU, THCU, LABBARB  Alcohol Level: No results for input(s): ETH in the last 168 hours.  Other results: EKG: sinus  rhythm at 61 bpm.  Imaging: No results found.  Assessment: 83 y.o. male with a history of atrial fibrillation on Apixaban/ASA and recent infarct who present after an episode of confusion, visual changes, nausea, vomiting and gait instability.  Now improved with only visual changes noted on neurological examination.  Head CT reviewed and shows evidence of small vessel disease but shows no evidence of acute changes.  Concern remains for infarct but patient not a tPA candidate.  Will rule out seizure as well with patient being amnestic of much of the event.    Stroke Risk Factors - atrial fibrillation and hyperlipidemia  Plan: 1. HgbA1c, fasting lipid panel 2. MRI, MRA  of the brain without contrast 3. PT consult, OT consult, Speech consult 4. Echocardiogram 5. CTA of the head and neck.  Results will determine whether patient more appropriate for admission here or at another facility.   6. Prophylactic therapy-Continue Apixaban and ASA 7. NPO until RN stroke swallow screen 8. Telemetry monitoring 9. Frequent neuro checks 10. EEG   Case discussed with Dr. Geronimo Boot, MD Neurology [phone] 02/13/2017, 9:44 AM

## 2017-02-13 NOTE — ED Notes (Signed)
Patient transported to Ultrasound 

## 2017-02-13 NOTE — ED Notes (Signed)
While rounding on patient, family states patient needs to use bowel movement.  This nurse inquired how patient ambulates at home and patient and family insisted patient is independent normally at home.  Patient was assisted with two assists to toilet, while on toilet patient had near syncope episode and dry heaves for approx. 30 seconds.  Call bell pressed and Tammy, medic to bedside for assistance.  Patient alert again and assisted back to bed.  Admitting MD at bedside and informed.

## 2017-02-13 NOTE — Progress Notes (Signed)
ANTICOAGULATION CONSULT NOTE - Initial Consult  Pharmacy Consult for Apixaban (Eliquis)  Indication: atrial fibrillation  No Known Allergies  Patient Measurements: Height: 5\' 11"  (180.3 cm) Weight: 174 lb (78.9 kg) IBW/kg (Calculated) : 75.3  Vital Signs: Temp: 99.5 F (37.5 C) (07/10 1556) Temp Source: Oral (07/10 1556) BP: 130/61 (07/10 1556) Pulse Rate: 77 (07/10 1556)  Labs:  Recent Labs  02/13/17 0908  HGB 13.1  HCT 38.5*  PLT 246  APTT 33  LABPROT 13.6  INR 1.04  CREATININE 0.92  TROPONINI <0.03    Estimated Creatinine Clearance: 68.2 mL/min (by C-G formula based on SCr of 0.92 mg/dL).   Medical History: Past Medical History:  Diagnosis Date  . A-fib (HCC)   . GERD (gastroesophageal reflux disease)   . Hypercholesteremia   . Stroke [facility](HCC)     Assessment: 83 yo male with PMH of A.Fib. Pharmacy consulted for Eliquis (Apixaban) dosing.    Plan:  Will start patient on Apixaban 5mg  twice daily.  Monitor patient for S/Sx of bleeding. Pharmacy will provide counseling prior to discharge.   Gardner CandleSheema M Lewis Grivas, PharmD, BCPS Clinical Pharmacist 02/13/2017 4:58 PM

## 2017-02-13 NOTE — ED Notes (Signed)
Per family pt has been hallucinating "flies in the room" from morphine, pt states " pain is better but still there". Pt requesting tylenol, takes tylenol at home for chronic back pain. [facility]

## 2017-02-13 NOTE — Plan of Care (Signed)
Spoke to Dr. Nemiah CommanderKalisetti about pt's visual changes.  He was unable to read the complete phrases and sentences.  Read what was only R side.  He didn't have similar problem with the pictures.  Eating dinner, he couldn't see the food on the L side of the plate - had to turn the plate.  Also couldn't see me when I stood on his L side to hand him medications.  Pt having MRI tonight.

## 2017-02-13 NOTE — ED Notes (Signed)
Pt 2 daughters and brother are present at the bedside.

## 2017-02-13 NOTE — Progress Notes (Signed)
BP 106/42 lying, 103/39 sitting. [facility] asymptomatic [facility]. Called Dr Tobi BastosPyreddy and no new orders.  Will continue to monitor. Henriette CombsSarah Tyffani Foglesong RN

## 2017-02-13 NOTE — ED Provider Notes (Signed)
[facility] Emergency Department Provider Note   ____________________________________________    I have reviewed the triage vital signs and the nursing notes.   HISTORY  Chief Complaint Altered Mental Status     HPI [name] is a 83 y.o. male who presents with complaints of dizziness. Patient reports he felt well when he got up this morning and he went to attempt to have a bowel movement but felt constipated so he decided to drink some water rapidly. He got water and was walking back to the toilet but reports he had confusion about how to sit on the toilet. He states it was difficult for him to figure out how to turn his body which she reports is very unusual for him. He then developed multiple episodes of vomiting. He thinks he may have passed out but he is not sure. He had difficulty dialing the phone as well because he could not see the numbers properly. Apparent history of a stroke in the past. Patient is on blood thinners for atrial fibrillation   Past Medical History:  Diagnosis Date  . GERD (gastroesophageal reflux disease)   . Hypercholesteremia   . Stroke [facility])     Patient Active Problem List   Diagnosis Date Noted  . Tachycardia 04/20/2016    Past Surgical History:  Procedure Laterality Date  . TOTAL HIP ARTHROPLASTY Left     Prior to Admission medications   Medication Sig Start Date End Date Taking? Authorizing Provider  aspirin EC 81 MG tablet Take 81 mg by mouth daily.   Yes [provider]  atorvastatin (LIPITOR) 80 MG tablet Take 80 mg by mouth daily.    Yes [provider]  Cholecalciferol 1000 units TBDP Take 1 tablet by mouth daily.   Yes [provider]  ferrous sulfate 325 (65 FE) MG tablet Take 325 mg by mouth daily with breakfast.   Yes [provider]  finasteride (PROSCAR) 5 MG tablet Take 5 mg by mouth daily.   Yes [provider]  omeprazole (PRILOSEC) 20 MG  capsule Take 20 mg by mouth daily.   Yes [provider]  sertraline (ZOLOFT) 25 MG tablet Take 25 mg by mouth daily.   Yes [provider]  tamsulosin (FLOMAX) 0.4 MG CAPS capsule Take 0.4 mg by mouth.   Yes [provider]  ciprofloxacin (CIPRO) 500 MG tablet Take 1 tablet (500 mg total) by mouth 2 (two) times daily. 08/09/16   Irean Hong, MD  docusate sodium (COLACE) 100 MG capsule Take 100 mg by mouth daily as needed for mild constipation.    [provider]     Allergies Patient has no known allergies.  Family History  Problem Relation Age of Onset  . Hypertension Father     Social History Social History  Substance Use Topics  . Smoking status: Never Smoker  . Smokeless tobacco: Never Used  . Alcohol use No    Review of Systems  Constitutional: No fever/chills Eyes: As above ENT: Mild neck pain Cardiovascular: Denies chest pain. Respiratory: Denies shortness of breath. Gastrointestinal: No abdominal pain.  Vomiting as above Genitourinary: Negative for dysuria. Musculoskeletal: Negative for back pain. Skin: Abrasion to the forehead Neurological: Negative for Focal weakness   ____________________________________________   PHYSICAL EXAM:  VITAL SIGNS: ED Triage Vitals  Enc Vitals Group     BP 02/13/17 0902 105/79     Pulse Rate 02/13/17 0902 62     Resp  02/13/17 0902 17     Temp 02/13/17 0909 97.9 F (36.6 C)     Temp Source 02/13/17 0902 Oral     SpO2 02/13/17 0902 100 %     Weight --      Height --      Head Circumference --      Peak Flow --      Pain Score --      Pain Loc --      Pain Edu? --      Excl. in GC? --     Constitutional: Alert and oriented. No acute distress. Pleasant and interactive Eyes: Conjunctivae are normal. PERRLA, EOMI, Head: Atraumatic. Nose: No congestion/rhinnorhea. Mouth/Throat: Mucous membranes are moist.   Neck:  Painless ROM no significant vertebral tenderness to  palpation Cardiovascular: Normal rate, regular rhythm. Grossly normal heart sounds.  Good peripheral circulation. Respiratory: Normal respiratory effort.  No retractions. Lungs CTAB. Gastrointestinal: Soft and nontender. No distention.  No CVA tenderness. Genitourinary: deferred Musculoskeletal: Warm and well perfused Neurologic:  Normal speech and language. Cranial nerves II through XII seem normal. Positive dysdiadochokinesis, unable to perform finger to nose with either hand, unable to do heel-to-shin accurately bilaterally. Skin:  Skin is warm, dry and intact. No rash noted. Psychiatric: Mood and affect are normal. Speech and behavior are normal.  ____________________________________________   LABS (all labs ordered are listed, but only abnormal results are displayed)  Labs Reviewed  COMPREHENSIVE METABOLIC PANEL - Abnormal; Notable for the following:       Result Value   Glucose, Bld 124 (*)    All other components within normal limits  CBC WITH DIFFERENTIAL/PLATELET - Abnormal; Notable for the following:    WBC 13.0 (*)    HCT 38.5 (*)    RDW 17.6 (*)    Neutro Abs 10.9 (*)    Lymphs Abs 0.8 (*)    Monocytes Absolute 1.1 (*)    All other components within normal limits  URINALYSIS, COMPLETE (UACMP) WITH MICROSCOPIC - Abnormal; Notable for the following:    Color, Urine YELLOW (*)    APPearance HAZY (*)    Hgb urine dipstick SMALL (*)    Leukocytes, UA LARGE (*)    Bacteria, UA RARE (*)    Squamous Epithelial / LPF 0-5 (*)    All other components within normal limits  URINE CULTURE  APTT  TROPONIN I  PROTIME-INR  URINE DRUG SCREEN, QUALITATIVE ([facility] ONLY)   ____________________________________________  EKG  ED ECG REPORT I, Jene Every, the attending physician, personally viewed and interpreted this ECG.  Date: 02/13/2017  Rate: 61 Rhythm: normal sinus rhythm QRS Axis: normal Intervals: normal ST/T Wave abnormalities: normal Narrative Interpretation:  unremarkable  ____________________________________________  RADIOLOGY  No acute stroke on CT ____________________________________________   PROCEDURES  Procedure(s) performed: No    Critical Care performed: No ____________________________________________   INITIAL IMPRESSION / ASSESSMENT AND PLAN / ED COURSE  Pertinent labs & imaging results that were available during my care of the patient were reviewed by me and considered in my medical decision making (see chart for details).  Because of abnormal neuro findings code stroke called. Dr. Thad Ranger in to see the patient  CT scan reported as no acute infarct  ----------------------------------------- 9:54 AM on 02/13/2017 -----------------------------------------  Dr. Thad Ranger has ordered CTA to evaluate posterior circulation. Does not recommend TPA at this time as neuro exam has improved. Urinalysis done as rates UTI, antibiotics started. Ultrasound testicle demonstrates orchitis this will likely improve with  antibiotics    ____________________________________________   FINAL CLINICAL IMPRESSION(S) / ED DIAGNOSES  Final diagnoses:  Transient cerebral ischemia, unspecified type  Acute cystitis without hematuria  Orchitis      NEW MEDICATIONS STARTED DURING THIS VISIT:  New Prescriptions   No medications on file     Note:  This document was prepared using Dragon voice recognition software and may include unintentional dictation errors.    Jene EveryKinner, Zuleyma Scharf, MD 02/13/17 1302

## 2017-02-13 NOTE — ED Notes (Signed)
Code  Stroke  Called  To 333 

## 2017-02-13 NOTE — Progress Notes (Signed)
Pharmacy Antibiotic Note  Gregory BailGeorge Thomas [name] is a 83 y.o. male admitted on 02/13/2017 with acute metabolic encephalopathy/?UTI.  Pharmacy has been consulted for ceftriaxone dosing. Patient received ceftriaxone 1g IV x 1 in ED on 7/10.    Plan: Continue ceftriaxone 1g IV Q24hr with next scheduled dose on 7/11.      Temp (24hrs), Avg:98.7 F (37.1 C), Min:97.9 F (36.6 C), Max:99.5 F (37.5 C)   Recent Labs Lab 02/13/17 0908  WBC 13.0*  CREATININE 0.92    CrCl cannot be calculated (Unknown ideal weight.).    No Known Allergies  Antimicrobials this admission: Ceftriaxone 7/10 >>  Dose adjustments this admission: N/A  Microbiology results: 7/10 BCx: pending  7/10 UCx: pending    Thank you for allowing pharmacy to be a part of this patient's care.  Simpson,Michael L 02/13/2017 4:39 PM

## 2017-02-13 NOTE — ED Triage Notes (Addendum)
Pt comes into the ED via EMS from home with c/o having sudden onset N/V this morning and then not sure but thinking he had diarrhea but is not sure. Pt has a hematoma above the right eye but does not remember falling. Pt states he was not able to remember his daughters phone numbers and just called 911 but states he even had difficulty dialing that..last known well was at 7am this morning before the vomiting.

## 2017-02-13 NOTE — ED Notes (Signed)
Pt c/o feeling like he needs to urinate but is unable to, with a hx of self cath. EDP notified and orders initiated for bladder scan and I&O..[name]

## 2017-02-13 NOTE — ED Notes (Signed)
Patient transported to CTA 

## 2017-02-14 ENCOUNTER — Inpatient Hospital Stay
Admit: 2017-02-14 | Discharge: 2017-02-14 | Disposition: A | Discharge status: Home / Self Care | Payer: Medicare Other | Attending: Internal Medicine | Admitting: Internal Medicine

## 2017-02-14 LAB — CBC
HEMATOCRIT: 36.3 % — AB (ref 40.0–52.0)
Hemoglobin: 12.1 g/dL — ABNORMAL LOW (ref 13.0–18.0)
MCH: 28.8 pg (ref 26.0–34.0)
MCHC: 33.3 g/dL (ref 32.0–36.0)
MCV: 86.3 fL (ref 80.0–100.0)
Platelets: 220 10*3/uL (ref 150–440)
RBC: 4.2 MIL/uL — ABNORMAL LOW (ref 4.40–5.90)
RDW: 17.7 % — AB (ref 11.5–14.5)
WBC: 20.1 10*3/uL — AB (ref 3.8–10.6)

## 2017-02-14 LAB — BASIC METABOLIC PANEL
ANION GAP: 6 (ref 5–15)
BUN: 17 mg/dL (ref 6–20)
CO2: 25 mmol/L (ref 22–32)
Calcium: 8.5 mg/dL — ABNORMAL LOW (ref 8.9–10.3)
Chloride: 104 mmol/L (ref 101–111)
Creatinine, Ser: 1.04 mg/dL (ref 0.61–1.24)
GFR calc Af Amer: >60 mL/min (ref >60)
Glucose, Bld: 112 mg/dL — ABNORMAL HIGH (ref 65–99)
POTASSIUM: 3.6 mmol/L (ref 3.5–5.1)
SODIUM: 135 mmol/L (ref 135–145)

## 2017-02-14 MED ORDER — SODIUM CHLORIDE 0.9 % IV BOLUS (SEPSIS)
1000.0000 mL | Freq: Once | INTRAVENOUS | Status: AC
  Administered 2017-02-14: 09:00:00 1000 mL via INTRAVENOUS

## 2017-02-14 NOTE — Progress Notes (Signed)
Patient ID: [name], male   DOB: 12/20/1933, 83 y.o.   MRN: [id number]  [facility] PROGRESS NOTE  [name] WUJ:[id number] DOB: 1933/11/19 DOA: 02/13/2017 PCP: [facility]  HPI/Subjective: Patient feeling okay. His testicles are little less painful but still tender today. Still can't see very well out of his left eye. History and feels okay.  Objective: Vitals:   02/14/17 0907 02/14/17 1347  BP: (!) 105/54 (!) 125/49  Pulse: 63 67  Resp: 18   Temp: 97.6 F (36.4 C) 97.9 F (36.6 C)    Filed Weights   02/13/17 1556  Weight: 78.9 kg (174 lb)    ROS: Review of Systems  Constitutional: Negative for chills and fever.  Eyes: Negative for blurred vision.  Respiratory: Negative for cough and shortness of breath.   Cardiovascular: Negative for chest pain.  Gastrointestinal: Negative for abdominal pain, constipation, diarrhea, nausea and vomiting.  Genitourinary: Positive for dysuria.  Musculoskeletal: Negative for joint pain.  Neurological: Negative for dizziness and headaches.   Exam: Physical Exam  Constitutional: He is oriented to person, place, and time.  HENT:  Nose: No mucosal edema.  Mouth/Throat: No oropharyngeal exudate or posterior oropharyngeal edema.  Eyes: Conjunctivae, EOM and lids are normal. Pupils are equal, round, and reactive to light.  Neck: No JVD present. Carotid bruit is not present. No edema present. No thyroid mass and no thyromegaly present.  Cardiovascular: S1 normal and S2 normal.  Exam reveals no gallop.   No murmur heard. Pulses:      Dorsalis pedis pulses are 2+ on the right side, and 2+ on the left side.  Respiratory: No respiratory distress. He has no wheezes. He has no rhonchi. He has no rales.  GI: Soft. Bowel sounds are normal. There is no tenderness.  Genitourinary: Right testis shows tenderness. Left testis shows tenderness.  Musculoskeletal:       Right ankle: He exhibits swelling.        Left ankle: He exhibits swelling.  Lymphadenopathy:    He has no cervical adenopathy.  Neurological: He is alert and oriented to person, place, and time.  Vision and left eye legally blind. Could not read 20/200 with his left eye. Fingers needed to be right in front of his vision in order to see.  Skin: Skin is warm. No rash noted. Nails show no clubbing.  Psychiatric: He has a normal mood and affect.      Data Reviewed: Basic Metabolic Panel:  Recent Labs Lab 02/13/17 0908 02/14/17 0547  NA 138 135  K 4.2 3.6  CL 105 104  CO2 27 25  GLUCOSE 124* 112*  BUN 14 17  CREATININE 0.92 1.04  CALCIUM 8.9 8.5*   Liver Function Tests:  Recent Labs Lab 02/13/17 0908  AST 28  ALT 28  ALKPHOS 79  BILITOT 0.7  PROT 7.1  ALBUMIN 3.8   No results for input(s): LIPASE, AMYLASE in the last 168 hours. No results for input(s): AMMONIA in the last 168 hours. CBC:  Recent Labs Lab 02/13/17 0908 02/14/17 0547  WBC 13.0* 20.1*  NEUTROABS 10.9*  --   HGB 13.1 12.1*  HCT 38.5* 36.3*  MCV 86.1 86.3  PLT 246 220   Cardiac Enzymes:  Recent Labs Lab 02/13/17 0908  TROPONINI <0.03     Recent Results (from the past 240 hour(s))  Urine Culture     Status: Abnormal (Preliminary result)   Collection Time: 02/13/17 10:57 AM  Result Value Ref  Range Status   Specimen Description URINE, RANDOM  Final   Special Requests NONE  Final   Culture (A)  Final    >=100,000 COLONIES/mL ESCHERICHIA COLI SUSCEPTIBILITIES TO FOLLOW Performed at [facility], [address] [place], [place] [address]    Report Status PENDING  Incomplete  CULTURE, BLOOD (ROUTINE X 2) w Reflex to ID Panel     Status: None (Preliminary result)   Collection Time: 02/13/17  4:27 PM  Result Value Ref Range Status   Specimen Description BLOOD RIGHT HAND  Final   Special Requests   Final    BOTTLES DRAWN AEROBIC AND ANAEROBIC Blood Culture adequate volume   Culture NO GROWTH < 24 HOURS  Final   Report  Status PENDING  Incomplete  CULTURE, BLOOD (ROUTINE X 2) w Reflex to ID Panel     Status: None (Preliminary result)   Collection Time: 02/13/17  4:27 PM  Result Value Ref Range Status   Specimen Description BLOOD LEFT ASSIST CONTROL  Final   Special Requests   Final    BOTTLES DRAWN AEROBIC AND ANAEROBIC Blood Culture adequate volume   Culture NO GROWTH < 24 HOURS  Final   Report Status PENDING  Incomplete     Studies: Ct Angio Head W Or Wo Contrast  Result Date: 02/13/2017 CLINICAL DATA:  Sudden onset nausea and vomiting. EXAM: CT ANGIOGRAPHY HEAD AND NECK TECHNIQUE: Multidetector CT imaging of the head and neck was performed using the standard protocol during bolus administration of intravenous contrast. Multiplanar CT image reconstructions and MIPs were obtained to evaluate the vascular anatomy. Carotid stenosis measurements (when applicable) are obtained utilizing NASCET criteria, using the distal internal carotid diameter as the denominator. CONTRAST:  75 cc Isovue 370 intravenous COMPARISON:  Head CT from earlier today.  CTA neck 04/15/2009 FINDINGS: CTA NECK FINDINGS Aortic arch: Normal diameter. No acute finding. Atherosclerotic calcification. Three vessel branching. Right carotid system: Moderate calcified and noncalcified plaque at the common carotid bifurcation and ICA bulb without stenosis or ulceration. Negative for dissection. Left carotid system: Previous endarterectomy - left ICA stenosis and complex plaque seen previously is resolved and there is left neck scarring. Vessels are smooth and widely patent. Vertebral arteries: No brachiocephalic or proximal subclavian stenosis. The dominant left vertebral artery is smooth and diffusely patent to the dura. The non dominant right vertebral artery has a severe stenosis at the origin, with non visible lumen at this level. The vessel is smaller than prior and is subtly asymmetrically less dense today, attributed to underfilling. No suspected  dissection. Skeleton: No acute or aggressive finding. Other neck: No incidental mass or adenopathy. Upper chest: No acute finding. Review of the MIP images confirms the above findings CTA HEAD FINDINGS Anterior circulation: Symmetric carotid arteries. Moderate atherosclerotic plaque on the carotid siphons. No major branch occlusion or significant and reversible stenosis. No beading or aneurysm. Posterior circulation: Small less intensely opacified right vertebral artery with probable mid V4 segment stenosis. A right pica is not clearly seen, but stable from 2010. There is a large right AICA. Small distal basilar in the setting of fetal type PCA circulation. Venous sinuses: Patent Anatomic variants: Fetal type PCA. Delayed phase: No abnormal intracranial enhancement. Remote left MCA territory infarct. Review of the MIP images confirms the above findings IMPRESSION: 1. No emergent large vessel occlusion. 2. 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 3. Widely patent left carotid endarterectomy. 4. Moderate atherosclerotic plaque  at the right cervical carotid bifurcation without flow limiting stenosis. Electronically Signed   By: Marnee SpringJonathon  Watts M.D.   On: 02/13/2017 10:46   Ct Head Wo Contrast  Result Date: 02/13/2017 CLINICAL DATA:  Unwitnessed fall.  Nausea and vomiting EXAM: CT HEAD WITHOUT CONTRAST CT CERVICAL SPINE WITHOUT CONTRAST TECHNIQUE: Multidetector CT imaging of the head and cervical spine was performed following the standard protocol without intravenous contrast. Multiplanar CT image reconstructions of the cervical spine were also generated. COMPARISON:  Head CT April 20, 2016 FINDINGS: CT HEAD FINDINGS Brain: There is mild diffuse atrophy, stable. There is no intracranial mass, hemorrhage, extra-axial fluid collection, or midline shift. There is evidence of a prior infarct in the left parietal lobe, stable. There is also evidence of a  prior infarct in the superior posterior left frontal lobe near the junction with the left parietal lobe, stable. There is patchy small vessel disease throughout the centra semiovale bilaterally. There is evidence of a prior small lacunar infarct in the posterior left lentiform nucleus. There is note new gray-white compartment lesion. No acute infarct evident. Vascular: There is no appreciable hyperdense vessel. There is mild calcification in the left carotid siphon. Skull: The bony calvarium appears intact. There is a small right frontal scalp hematoma. Sinuses/Orbits: There is opacification of multiple ethmoid air cells bilaterally. There is mucosal thickening in the inferior frontal sinus regions. There is a concha bullosa on each side, an anatomic variant. Other paranasal sinuses are clear. Orbits appear symmetric bilaterally. Note that there is preseptal soft tissue swelling over the right eye. Other: There is mild mastoid air cell disease on the right inferiorly. Mastoids elsewhere are clear. CT CERVICAL SPINE FINDINGS Alignment: There is no appreciable spondylolisthesis. Skull base and vertebrae: Skull base and craniocervical junction regions appear within normal limits. There is no demonstrable fracture. There are no blastic or lytic bone lesions. Soft tissues and spinal canal: Prevertebral soft tissues and predental space regions are normal. There is no paraspinous lesion. There is no cord or canal hematoma. Disc levels: There is predental space narrowing and spur formation consistent with localized osteoarthritic change. There is moderate disc space narrowing at C6-7. There is facet hypertrophy at multiple levels. There is no frank nerve root effacement. No disc extrusion or stenosis evident. Upper chest: Visualized upper lung regions clear. Other: There is calcification in both subclavian arteries. There is calcification in the right carotid artery. IMPRESSION: CT head: Atrophy with supratentorial small  vessel disease. Prior infarcts in the posterior left frontal lobe and left parietal lobe regions. Small prior infarct left lentiform nucleus. No acute infarct evident. No mass, hemorrhage, or extra-axial fluid collection. There is ethmoid sinus disease with opacification of multiple ethmoid sinuses. There is also mild inferior frontal sinus disease bilaterally. There is mild inferior left mastoid disease. There is a right frontal scalp hematoma. No fracture evident. There is preseptal soft tissue edema over the right eye. CT cervical spine: No evident fracture or spondylolisthesis. Osteoarthritic change noted at several levels. Areas of arterial vascular calcification noted. Critical Value/emergent results were called by telephone at the time of interpretation on 02/13/2017 at 9:43 am to Dr. Jene EveryOBERT KINNER , who verbally acknowledged these results. Electronically Signed   By: Bretta BangWilliam  Woodruff III M.D.   On: 02/13/2017 09:44   Ct Angio Neck W Or Wo Contrast  Result Date: 02/13/2017 CLINICAL DATA:  Sudden onset nausea and vomiting. EXAM: CT ANGIOGRAPHY HEAD AND NECK TECHNIQUE: Multidetector CT imaging of the head and neck  was performed using the standard protocol during bolus administration of intravenous contrast. Multiplanar CT image reconstructions and MIPs were obtained to evaluate the vascular anatomy. Carotid stenosis measurements (when applicable) are obtained utilizing NASCET criteria, using the distal internal carotid diameter as the denominator. CONTRAST:  75 cc Isovue 370 intravenous COMPARISON:  Head CT from earlier today.  CTA neck 04/15/2009 FINDINGS: CTA NECK FINDINGS Aortic arch: Normal diameter. No acute finding. Atherosclerotic calcification. Three vessel branching. Right carotid system: Moderate calcified and noncalcified plaque at the common carotid bifurcation and ICA bulb without stenosis or ulceration. Negative for dissection. Left carotid system: Previous endarterectomy - left ICA stenosis  and complex plaque seen previously is resolved and there is left neck scarring. Vessels are smooth and widely patent. Vertebral arteries: No brachiocephalic or proximal subclavian stenosis. The dominant left vertebral artery is smooth and diffusely patent to the dura. The non dominant right vertebral artery has a severe stenosis at the origin, with non visible lumen at this level. The vessel is smaller than prior and is subtly asymmetrically less dense today, attributed to underfilling. No suspected dissection. Skeleton: No acute or aggressive finding. Other neck: No incidental mass or adenopathy. Upper chest: No acute finding. Review of the MIP images confirms the above findings CTA HEAD FINDINGS Anterior circulation: Symmetric carotid arteries. Moderate atherosclerotic plaque on the carotid siphons. No major branch occlusion or significant and reversible stenosis. No beading or aneurysm. Posterior circulation: Small less intensely opacified right vertebral artery with probable mid V4 segment stenosis. A right pica is not clearly seen, but stable from 2010. There is a large right AICA. Small distal basilar in the setting of fetal type PCA circulation. Venous sinuses: Patent Anatomic variants: Fetal type PCA. Delayed phase: No abnormal intracranial enhancement. Remote left MCA territory infarct. Review of the MIP images confirms the above findings IMPRESSION: 1. No emergent large vessel occlusion. 2. 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 3. Widely patent left carotid endarterectomy. 4. Moderate atherosclerotic plaque at the right cervical carotid bifurcation without flow limiting stenosis. Electronically Signed   By: Marnee Spring M.D.   On: 02/13/2017 10:46   Ct Cervical Spine Wo Contrast  Result Date: 02/13/2017 CLINICAL DATA:  Unwitnessed fall.  Nausea and vomiting EXAM: CT HEAD WITHOUT CONTRAST CT CERVICAL SPINE WITHOUT CONTRAST TECHNIQUE:  Multidetector CT imaging of the head and cervical spine was performed following the standard protocol without intravenous contrast. Multiplanar CT image reconstructions of the cervical spine were also generated. COMPARISON:  Head CT April 20, 2016 FINDINGS: CT HEAD FINDINGS Brain: There is mild diffuse atrophy, stable. There is no intracranial mass, hemorrhage, extra-axial fluid collection, or midline shift. There is evidence of a prior infarct in the left parietal lobe, stable. There is also evidence of a prior infarct in the superior posterior left frontal lobe near the junction with the left parietal lobe, stable. There is patchy small vessel disease throughout the centra semiovale bilaterally. There is evidence of a prior small lacunar infarct in the posterior left lentiform nucleus. There is note new gray-white compartment lesion. No acute infarct evident. Vascular: There is no appreciable hyperdense vessel. There is mild calcification in the left carotid siphon. Skull: The bony calvarium appears intact. There is a small right frontal scalp hematoma. Sinuses/Orbits: There is opacification of multiple ethmoid air cells bilaterally. There is mucosal thickening in the inferior frontal sinus regions. There is a concha bullosa on each side, an anatomic variant. Other  paranasal sinuses are clear. Orbits appear symmetric bilaterally. Note that there is preseptal soft tissue swelling over the right eye. Other: There is mild mastoid air cell disease on the right inferiorly. Mastoids elsewhere are clear. CT CERVICAL SPINE FINDINGS Alignment: There is no appreciable spondylolisthesis. Skull base and vertebrae: Skull base and craniocervical junction regions appear within normal limits. There is no demonstrable fracture. There are no blastic or lytic bone lesions. Soft tissues and spinal canal: Prevertebral soft tissues and predental space regions are normal. There is no paraspinous lesion. There is no cord or canal  hematoma. Disc levels: There is predental space narrowing and spur formation consistent with localized osteoarthritic change. There is moderate disc space narrowing at C6-7. There is facet hypertrophy at multiple levels. There is no frank nerve root effacement. No disc extrusion or stenosis evident. Upper chest: Visualized upper lung regions clear. Other: There is calcification in both subclavian arteries. There is calcification in the right carotid artery. IMPRESSION: CT head: Atrophy with supratentorial small vessel disease. Prior infarcts in the posterior left frontal lobe and left parietal lobe regions. Small prior infarct left lentiform nucleus. No acute infarct evident. No mass, hemorrhage, or extra-axial fluid collection. There is ethmoid sinus disease with opacification of multiple ethmoid sinuses. There is also mild inferior frontal sinus disease bilaterally. There is mild inferior left mastoid disease. There is a right frontal scalp hematoma. No fracture evident. There is preseptal soft tissue edema over the right eye. CT cervical spine: No evident fracture or spondylolisthesis. Osteoarthritic change noted at several levels. Areas of arterial vascular calcification noted. Critical Value/emergent results were called by telephone at the time of interpretation on 02/13/2017 at 9:43 am to Dr. Jene Every , who verbally acknowledged these results. Electronically Signed   By: Bretta Bang III M.D.   On: 02/13/2017 09:44   Mr Brain Wo Contrast  Result Date: 02/13/2017 CLINICAL DATA:  Altered mental status, possible syncopal episode on toilet. History of stroke, atrial fibrillation on anticoagulation. EXAM: MRI HEAD WITHOUT CONTRAST TECHNIQUE: Multiplanar, multiecho pulse sequences of the brain and surrounding structures were obtained without intravenous contrast. COMPARISON:  CT HEAD February 13, 2017 at 0919 hours an MRI head July 08, 2012 FINDINGS: Mild motion degraded examination. BRAIN: Patchy  reduced diffusion RIGHT temporal occipital lobe and to lesser extent RIGHT parietal lobe with low ADC values. Numerous foci of susceptibility artifact and predominately peripheral distribution. Patchy LEFT subarachnoid susceptibility artifact. Minimal peripheral LEFT temporal encephalomalacia. Similar LEFT frontotemporal lobe encephalomalacia. Moderate ventriculomegaly on the basis of global parenchymal brain volume loss. Patchy supratentorial white matter T2 hyperintensities exclusive of the aforementioned abnormality. Old LEFT basal ganglia hemorrhagic infarct. Old pontine lacunar infarcts. VASCULAR: Normal major intracranial vascular flow voids present at skull base. SKULL AND UPPER CERVICAL SPINE: No abnormal sellar expansion. No suspicious calvarial bone marrow signal. Craniocervical junction maintained. SINUSES/ORBITS: Mild paranasal sinus mucosal thickening. LEFT mastoid effusion. The included ocular globes and orbital contents are non-suspicious. OTHER: None. IMPRESSION: 1. Acute small scattered RIGHT MCA and posterior watershed territory infarcts. 2. Old LEFT MCA infarcts, old LEFT basal ganglia hemorrhagic infarct. 3. Extensive peripheral chronic micro hemorrhages and artifact and patchy LEFT superficial siderosis, findings consistent with amyloid angiopathy. 4. Moderate chronic small vessel ischemic disease. Electronically Signed   By: Awilda Metro M.D.   On: 02/13/2017 20:21   US Scrotum  Result Date: 02/13/2017 CLINICAL DATA:  Testicular swelling and pain. EXAM: SCROTAL ULTRASOUND DOPPLER ULTRASOUND OF THE TESTICLES TECHNIQUE: Complete ultrasound examination of the  testicles, epididymis, and other scrotal structures was performed. Color and spectral Doppler ultrasound were also utilized to evaluate blood flow to the testicles. COMPARISON:  None. FINDINGS: Right testicle Measurements: 4.8 x 3.1 x 2.9 cm. No mass or microlithiasis visualized. Increased vascularity is noted on Doppler suggesting  orchitis. Left testicle Measurements: 4.9 x 2.5 x 2.2 cm. No mass or microlithiasis visualized. Right epididymis:  4 mm cyst is noted. Left epididymis:  4 mm cyst is noted. Hydrocele:  Mild right septated hydrocele is noted. Varicocele:  None visualized. Pulsed Doppler interrogation of both testes demonstrates normal low resistance arterial and venous waveforms bilaterally. IMPRESSION: Increased vascularity of right testicle is noted suggesting orchitis. Mild septated right hydrocele is noted as well. Electronically Signed   By: Lupita Raider, M.D.   On: 02/13/2017 12:25   [place] Art/ven Flow Abd Pelv Doppler  Result Date: 02/13/2017 CLINICAL DATA:  Testicular swelling and pain. EXAM: SCROTAL ULTRASOUND DOPPLER ULTRASOUND OF THE TESTICLES TECHNIQUE: Complete ultrasound examination of the testicles, epididymis, and other scrotal structures was performed. Color and spectral Doppler ultrasound were also utilized to evaluate blood flow to the testicles. COMPARISON:  None. FINDINGS: Right testicle Measurements: 4.8 x 3.1 x 2.9 cm. No mass or microlithiasis visualized. Increased vascularity is noted on Doppler suggesting orchitis. Left testicle Measurements: 4.9 x 2.5 x 2.2 cm. No mass or microlithiasis visualized. Right epididymis:  4 mm cyst is noted. Left epididymis:  4 mm cyst is noted. Hydrocele:  Mild right septated hydrocele is noted. Varicocele:  None visualized. Pulsed Doppler interrogation of both testes demonstrates normal low resistance arterial and venous waveforms bilaterally. IMPRESSION: Increased vascularity of right testicle is noted suggesting orchitis. Mild septated right hydrocele is noted as well. Electronically Signed   By: Lupita Raider, M.D.   On: 02/13/2017 12:25    Scheduled Meds: . apixaban  5 mg Oral BID  . aspirin EC  81 mg Oral Daily  . atorvastatin  80 mg Oral Daily  . cholecalciferol  1,000 Units Oral Daily  . ferrous sulfate  325 mg Oral Q breakfast  . finasteride  5 mg Oral  Daily  . pantoprazole  40 mg Oral Daily  . sertraline  25 mg Oral Daily   Continuous Infusions: . cefTRIAXone (ROCEPHIN)  IV      Assessment/Plan:  1. Acute CVA right MCA distribution and watershed areas. Microhemorrhages seen and amyloid suspected on MRI. Stop Flomax because this can cause hypotension. Given IV fluid bolus and monitor. Patient already on anticoagulation with aspirin and Eliquis. Neurology consultation. High-dose atorvastatin. Patient is legally blind with his left eye but power seems okay elsewhere. Await physical therapy evaluation. Echocardiogram. 2. Clinical sepsis with orchitis and acute cystitis with hematuria. Escherichia coli growing out of the urine culture. Rocephin should cover both. Testicular pain less today than yesterday as per the patient and family. 3. Relative hypotension. IV fluid hydration given. Hold Flomax for now 4. GERD on Protonix 5. Depression on Zoloft 6. Atrial fibrillation rate controlled on Eliquis 7. Please see ACP note. Patient made a DO NOT RESUSCITATE.  Code Status:     Code Status Orders        Start     Ordered   02/14/17 1323  Do not attempt resuscitation (DNR)  Continuous    Question Answer Comment  In the event of cardiac or respiratory ARREST Do not call a "code blue"   In the event of cardiac or respiratory ARREST Do not perform Intubation,  CPR, defibrillation or ACLS   In the event of cardiac or respiratory ARREST Use medication by any route, position, wound care, and other measures to relive pain and suffering. May use oxygen, suction and manual treatment of airway obstruction as needed for comfort.   Comments nurse may pronounce      02/14/17 1322    Code Status History    Date Active Date Inactive Code Status Order ID Comments User Context   02/13/2017  4:11 PM 02/14/2017  1:22 PM Full Code [id number]  Enid Baas, MD Inpatient   04/20/2016  7:48 PM 04/21/2016  7:57 AM Full Code [id number]  Auburn Bilberry, MD ED     Advance Directive Documentation     Most Recent Value  Type of Advance Directive  Healthcare Power of Attorney, Living will  Pre-existing out of facility DNR order (yellow form or pink MOST form)  -  "MOST" Form in Place?  -     Family Communication: 5 family members at the bedside Disposition Plan: Will likely need rehabilitation  Consultants:  Neurology  Antibiotics:  Rocephin  Time spent: 45 minutes including ACP time  Alford Highland  [employer]

## 2017-02-14 NOTE — Discharge Instructions (Addendum)
Ischemic Stroke An ischemic stroke is the sudden death of brain tissue. Blood carries oxygen to all areas of the body. This type of stroke happens when your blood does not flow to your brain like normal. Your brain cannot get the oxygen it needs. This is an emergency. It must be treated right away. Symptoms of a stroke usually happen all of a sudden. You may notice them when you wake up. They can include:  Weakness or loss of feeling in your face, arm, or leg. This often happens on one side of the body.  Trouble walking.  Trouble moving your arms or legs.  Loss of balance or coordination.  Feeling confused.  Trouble talking or understanding what people are saying.  Slurred speech.  Trouble seeing.  Seeing two of one object (double vision).  Feeling dizzy.  Feeling sick to your stomach (nauseous) and throwing up (vomiting).  A very bad headache for no reason.  Get help as soon as any of these problems start. This is important. Some treatments work better if they are given right away. These include:  Aspirin.  Medicines to control blood pressure.  A shot (injection) of medicine to break up the blood clot.  Treatments given in the blood vessel (artery) to take out the clot or break it up.  Other treatments may include:  Oxygen.  Fluids given through an IV tube.  Medicines to thin out your blood.  Procedures to help your blood flow better.  What increases the risk? Certain things may make you more likely to have a stroke. Some of these are things that you can change, such as:  Being very overweight (obesity).  Smoking.  Taking birth control pills.  Not being active.  Drinking too much alcohol.  Using drugs.  Other risk factors include:  High blood pressure.  High cholesterol.  Diabetes.  Heart disease.  Being [place], Native [address], Hispanic, or [place] Native.  Being over age 81.  Family history of stroke.  Having had blood  clots, stroke, or warning stroke (transient ischemic attack, TIA) in the past.  Sickle cell disease.  Being a woman with a history of high blood pressure in pregnancy (preeclampsia).  Migraine headache.  Sleep apnea.  Having an irregular heartbeat (atrial fibrillation).  Long-term (chronic) diseases that cause soreness and swelling (inflammation).  Disorders that affect how your blood clots.  Follow these instructions at home: Medicines  Take over-the-counter and prescription medicines only as told by your doctor.  If you were told to take aspirin or another medicine to thin your blood, take it exactly as told by your doctor. ? Taking too much of the medicine can cause bleeding. ? If you do not take enough, it may not work as well.  Know the side effects of your medicines. If you are taking a blood thinner, make sure you: ? Hold pressure over any cuts for longer than usual. ? Tell your dentist and other doctors that you take this medicine. ? Avoid activities that may cause damage or injury to your body. Eating and drinking  Follow instructions from your doctor about what you cannot eat or drink.  Eat healthy foods.  If you have trouble with swallowing, do these things to avoid choking: ? Take small bites when eating. ? Eat foods that are soft or pureed. Safety  Follow instructions from your health care team about physical activity.  Use a walker or cane as told by your doctor.  Keep your home safe  so you do not fall. This may include: ? Having experts look at your home to make sure it is safe. ? Putting grab bars in the bedroom and bathroom. ? Using raised toilets. ? Putting a seat in the shower. General instructions  Do not use any tobacco products. ? Examples of these are cigarettes, chewing tobacco, and e-cigarettes. ? If you need help quitting, ask your doctor.  Limit how much alcohol you drink. This means no more than 1 drink a day for nonpregnant women and  2 drinks a day for men. One drink equals 12 oz of beer, 5 oz of wine, or 1 oz of hard liquor.  If you need help to stop using drugs or alcohol, ask your doctor to refer you to a program or specialist.  Stay active. Exercise as told by your doctor.  Keep all follow-up visits as told by your doctor. This is important. Get help right away if:  You suddenly: ? Have weakness or loss of feeling in your face, arm, or leg. ? Feel confused. ? Have trouble talking or understanding what people are saying. ? Have trouble seeing. ? Have trouble walking. ? Have trouble moving your arms or legs. ? Feel dizzy. ? Lose your balance or coordination. ? Have a very bad headache and you do not know why.  You pass out (lose consciousness) or almost pass out.  You have jerky movements that you cannot control (seizure). These symptoms may be an emergency. Do not wait to see if the symptoms will go away. Get medical help right away. Call your local emergency services (911 in the [place] Do not drive yourself to the hospital. This information is not intended to replace advice given to you by your health care provider. Make sure you discuss any questions you have with your health care provider. Document Released: 07/13/2011 Document Revised: 01/04/2016 Document Reviewed: 10/20/2015 Elsevier Interactive Patient Education  2018 [employer] Information on my medicine - ELIQUIS (apixaban)  This medication education was reviewed with me or my healthcare representative as part of my discharge preparation.  The pharmacist that spoke with me during my hospital stay was:  Olene FlossMelissa D Maccia, [facility]  Why was Eliquis prescribed for you? Eliquis was prescribed for you to reduce the risk of a blood clot forming that can cause a stroke if you have a medical condition called atrial fibrillation (a type of irregular heartbeat).  What do You need to know about Eliquis ? Take your Eliquis TWICE DAILY - one tablet in the  morning and one tablet in the evening with or without food. If you have difficulty swallowing the tablet whole please discuss with your pharmacist how to take the medication safely.  Take Eliquis exactly as prescribed by your doctor and DO NOT stop taking Eliquis without talking to the doctor who prescribed the medication.  Stopping may increase your risk of developing a stroke.  Refill your prescription before you run out.  After discharge, you should have regular check-up appointments with your healthcare provider that is prescribing your Eliquis.  In the future your dose may need to be changed if your kidney function or weight changes by a significant amount or as you get older.  What do you do if you miss a dose? If you miss a dose, take it as soon as you remember on the same day and resume taking twice daily.  Do not take more than one dose of ELIQUIS at the same time to make up  a missed dose.  Important Safety Information A possible side effect of Eliquis is bleeding. You should call your healthcare provider right away if you experience any of the following: ? Bleeding from an injury or your nose that does not stop. ? Unusual colored urine (red or dark brown) or unusual colored stools (red or black). ? Unusual bruising for unknown reasons. ? A serious fall or if you hit your head (even if there is no bleeding).  Some medicines may interact with Eliquis and might increase your risk of bleeding or clotting while on Eliquis. To help avoid this, consult your healthcare provider or pharmacist prior to using any new prescription or non-prescription medications, including herbals, vitamins, non-steroidal anti-inflammatory drugs (NSAIDs) and supplements.  This website has more information on Eliquis (apixaban): http://www.eliquis.com/eliquis/home
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
## 2017-02-14 NOTE — Progress Notes (Signed)
Nutrition Brief Note  Patient identified on the Malnutrition Screening Tool (MST) Report  Wt Readings from Last 15 Encounters:  02/13/17 174 lb (78.9 kg)  08/31/16 190 lb (86.2 kg)  08/09/16 190 lb (86.2 kg)  04/21/16 187 lb 14.4 oz (85.2 kg)  04/20/16 190 lb (86.2 kg)   Spoke with patient and family members at bedside. Patient reports he has a good appetite. He eats 2 meals per day. He lives alone. For breakfast he eats 2 eggs, 2 pieces of toast with jelly, two cups of coffee, and orange juice. For lunch he may have 1/2 peanut butter and jelly sandwich. He reports his children will bring him over a hot meal at dinner for him to eat. Patient verbalized understanding of heart healthy diet.  Patient reports his UBW was around 193 lbs. In December 2017 he had a hip surgery. Since then he has been less active and has lost some weight. Patient has lost approximately 19 lbs (9.8% body weight) over 8 months, which is not significant for time frame.  Nutrition-Focused physical exam completed. Findings are no fat depletion, no muscle depletion, and no edema.   Body mass index is 24.27 kg/m. Patient meets criteria for normal weight based on current BMI.   Current diet order is Heart Healthy, patient is consuming approximately 100% of meals at this time. Labs and medications reviewed.   No nutrition interventions warranted at this time. If nutrition issues arise, please consult RD.   Helane RimaLeanne Hymie Gorr, MS, RD, LDN Pager: [phone] After Hours Pager: [phone]

## 2017-02-14 NOTE — Progress Notes (Signed)
Pts BP 93/49.  MD made aware.

## 2017-02-14 NOTE — Progress Notes (Signed)
Patient ID: [name], male   DOB: 08-24-1933, 83 y.o.   MRN: [id number]  ACP note  5 family members at the bedside along with the patient and myself and nursing staff.  With acute stroke on maximum therapy to prevent stroke is concerning. I discussed CODE STATUS with the patient and family. They stated that they have a living will which he would not want to be in a vegetative state of state or burden to his family. I also discussed the CODE STATUS somewhat to do in the acute situation now if he were to have his heart stop. The patient states he would like to be a DO NOT RESUSCITATE. Family is in agreement.  Time spent on ACP discussion 17 minutes  Dr. Alford Highlandichard Renezmae Canlas

## 2017-02-14 NOTE — Care Management (Signed)
Admitted to this facility with the diagnosis of encephalopathy. Lives alone. Daughter is Frann RiderLeasa Tilley [phone]). [facility] Cleatis [facility]Leslie [facility] last week. [facility]. [facility]. No skilled facility. No home oxygen. Uses a rolling walker to aid in ambulation. Takes care of all basic activities of daily living himself, drives. Larey SeatFell prior to this admission.  [facility] signed by daughter. Will fax this information, face sheet, history & physical, and progress notes to [facility] Ibrahim Mcpheeters RN MSN CCM Care Management [phone]
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
## 2017-02-14 NOTE — Plan of Care (Signed)
Problem: Physical Regulation: Goal: Ability to maintain clinical measurements within normal limits will improve Outcome: Progressing In am BP 105/54, 1L NS bolus given. BP post bolus 125/49. Dr Renae GlossWieting is aware.

## 2017-02-14 NOTE — Progress Notes (Signed)
Inpatient Rehabilitation  Per PT request, patient was screened by Fae PippinMelissa Bethaney Oshana for appropriateness for an Inpatient Acute Rehab consult.  I have called patient's daughter Frann RiderLeasa Tilley and await a call back.  At this time will follow for timing of medical readiness, patient/family decision, and bed availability.  Discussed with Steward DroneBrenda RN CM.  Please call with questions.   Charlane FerrettiMelissa Azell Bill, M.A., CCC/SLP Admission Coordinator  [facility]  Cell [phone]

## 2017-02-15 ENCOUNTER — Inpatient Hospital Stay (HOSPITAL_COMMUNITY)
Admission: RE | Admit: 2017-02-15 | Discharge: 2017-03-02 | DRG: 057 | Disposition: A | Discharge status: Home Health | Payer: Medicare Other | Source: Intra-hospital | Attending: Physical Medicine & Rehabilitation | Admitting: Physical Medicine & Rehabilitation

## 2017-02-15 ENCOUNTER — Encounter (HOSPITAL_COMMUNITY): Payer: Self-pay | Admitting: *Deleted

## 2017-02-15 DIAGNOSIS — M542 Cervicalgia: Secondary | ICD-10-CM

## 2017-02-15 DIAGNOSIS — Z7901 Long term (current) use of anticoagulants: Secondary | ICD-10-CM

## 2017-02-15 DIAGNOSIS — I63511 Cerebral infarction due to unspecified occlusion or stenosis of right middle cerebral artery: Secondary | ICD-10-CM | POA: Diagnosis not present

## 2017-02-15 DIAGNOSIS — N400 Enlarged prostate without lower urinary tract symptoms: Secondary | ICD-10-CM | POA: Diagnosis present

## 2017-02-15 DIAGNOSIS — M25512 Pain in left shoulder: Secondary | ICD-10-CM

## 2017-02-15 DIAGNOSIS — B9620 Unspecified Escherichia coli [E. coli] as the cause of diseases classified elsewhere: Secondary | ICD-10-CM | POA: Diagnosis present

## 2017-02-15 DIAGNOSIS — S0003XD Contusion of scalp, subsequent encounter: Secondary | ICD-10-CM

## 2017-02-15 DIAGNOSIS — N453 Epididymo-orchitis: Secondary | ICD-10-CM | POA: Diagnosis not present

## 2017-02-15 DIAGNOSIS — E785 Hyperlipidemia, unspecified: Secondary | ICD-10-CM | POA: Diagnosis not present

## 2017-02-15 DIAGNOSIS — Z79899 Other long term (current) drug therapy: Secondary | ICD-10-CM | POA: Diagnosis not present

## 2017-02-15 DIAGNOSIS — N401 Enlarged prostate with lower urinary tract symptoms: Secondary | ICD-10-CM | POA: Diagnosis not present

## 2017-02-15 DIAGNOSIS — Z7982 Long term (current) use of aspirin: Secondary | ICD-10-CM | POA: Diagnosis not present

## 2017-02-15 DIAGNOSIS — I63411 Cerebral infarction due to embolism of right middle cerebral artery: Secondary | ICD-10-CM

## 2017-02-15 DIAGNOSIS — I959 Hypotension, unspecified: Secondary | ICD-10-CM | POA: Diagnosis not present

## 2017-02-15 DIAGNOSIS — W19XXXD Unspecified fall, subsequent encounter: Secondary | ICD-10-CM | POA: Diagnosis present

## 2017-02-15 DIAGNOSIS — I63311 Cerebral infarction due to thrombosis of right middle cerebral artery: Secondary | ICD-10-CM | POA: Diagnosis not present

## 2017-02-15 DIAGNOSIS — G8194 Hemiplegia, unspecified affecting left nondominant side: Secondary | ICD-10-CM | POA: Diagnosis not present

## 2017-02-15 DIAGNOSIS — I69319 Unspecified symptoms and signs involving cognitive functions following cerebral infarction: Secondary | ICD-10-CM | POA: Diagnosis not present

## 2017-02-15 DIAGNOSIS — Z96642 Presence of left artificial hip joint: Secondary | ICD-10-CM | POA: Diagnosis present

## 2017-02-15 DIAGNOSIS — N390 Urinary tract infection, site not specified: Secondary | ICD-10-CM | POA: Diagnosis present

## 2017-02-15 DIAGNOSIS — I480 Paroxysmal atrial fibrillation: Secondary | ICD-10-CM | POA: Diagnosis present

## 2017-02-15 DIAGNOSIS — E784 Other hyperlipidemia: Secondary | ICD-10-CM | POA: Diagnosis not present

## 2017-02-15 DIAGNOSIS — M19012 Primary osteoarthritis, left shoulder: Secondary | ICD-10-CM | POA: Diagnosis not present

## 2017-02-15 DIAGNOSIS — S199XXA Unspecified injury of neck, initial encounter: Secondary | ICD-10-CM | POA: Diagnosis not present

## 2017-02-15 DIAGNOSIS — R414 Neurologic neglect syndrome: Secondary | ICD-10-CM | POA: Diagnosis not present

## 2017-02-15 DIAGNOSIS — R338 Other retention of urine: Secondary | ICD-10-CM | POA: Diagnosis not present

## 2017-02-15 DIAGNOSIS — G8929 Other chronic pain: Secondary | ICD-10-CM | POA: Diagnosis not present

## 2017-02-15 DIAGNOSIS — A419 Sepsis, unspecified organism: Secondary | ICD-10-CM | POA: Diagnosis not present

## 2017-02-15 DIAGNOSIS — I639 Cerebral infarction, unspecified: Secondary | ICD-10-CM | POA: Diagnosis not present

## 2017-02-15 DIAGNOSIS — K219 Gastro-esophageal reflux disease without esophagitis: Secondary | ICD-10-CM | POA: Diagnosis present

## 2017-02-15 DIAGNOSIS — I69398 Other sequelae of cerebral infarction: Secondary | ICD-10-CM | POA: Diagnosis not present

## 2017-02-15 DIAGNOSIS — I69354 Hemiplegia and hemiparesis following cerebral infarction affecting left non-dominant side: Secondary | ICD-10-CM | POA: Diagnosis not present

## 2017-02-15 DIAGNOSIS — E876 Hypokalemia: Secondary | ICD-10-CM | POA: Diagnosis not present

## 2017-02-15 LAB — URINE CULTURE: Culture: >=100000 — AB

## 2017-02-15 LAB — ECHOCARDIOGRAM COMPLETE
HEIGHTINCHES: 71 in
Weight: 2784 oz

## 2017-02-15 MED ORDER — DOCUSATE SODIUM 100 MG PO CAPS: 100.0000 mg | ORAL_CAPSULE | Freq: Every day | ORAL | Status: DC | PRN

## 2017-02-15 MED ORDER — TRAMADOL HCL 50 MG PO TABS: 50.0000 mg | ORAL_TABLET | Freq: Four times a day (QID) | ORAL | 0 refills | Status: DC | PRN

## 2017-02-15 MED ORDER — SORBITOL 70 % SOLN
30.0000 mL | Freq: Every day | Status: DC | PRN
  Administered 2017-02-17: 30 mL via ORAL
  Filled 2017-02-15: qty 30

## 2017-02-15 MED ORDER — APIXABAN 5 MG PO TABS
5.0000 mg | ORAL_TABLET | Freq: Two times a day (BID) | ORAL | Status: DC
  Administered 2017-02-15 – 2017-03-02 (×30): 5 mg via ORAL
  Filled 2017-02-15 (×30): qty 1

## 2017-02-15 MED ORDER — FERROUS SULFATE 325 (65 FE) MG PO TABS
325.0000 mg | ORAL_TABLET | Freq: Every day | ORAL | Status: DC
  Administered 2017-02-16 – 2017-03-02 (×15): 325 mg via ORAL
  Filled 2017-02-15 (×15): qty 1

## 2017-02-15 MED ORDER — VITAMIN D 1000 UNITS PO TABS
1000.0000 [IU] | ORAL_TABLET | Freq: Every day | ORAL | Status: DC
  Administered 2017-02-16 – 2017-03-02 (×15): 1000 [IU] via ORAL
  Filled 2017-02-15 (×15): qty 1

## 2017-02-15 MED ORDER — ATORVASTATIN CALCIUM 80 MG PO TABS
80.0000 mg | ORAL_TABLET | Freq: Every day | ORAL | Status: DC
  Administered 2017-02-16 – 2017-03-02 (×15): 80 mg via ORAL
  Filled 2017-02-15 (×15): qty 1

## 2017-02-15 MED ORDER — ONDANSETRON HCL 4 MG PO TABS
4.0000 mg | ORAL_TABLET | Freq: Four times a day (QID) | ORAL | Status: DC | PRN
Start: 2017-02-15 — End: 2017-03-02

## 2017-02-15 MED ORDER — TRAMADOL HCL 50 MG PO TABS
50.0000 mg | ORAL_TABLET | Freq: Four times a day (QID) | ORAL | Status: DC | PRN
Start: 2017-02-15 — End: 2017-03-02
  Administered 2017-02-15 – 2017-02-26 (×16): 50 mg via ORAL
  Filled 2017-02-15 (×19): qty 1

## 2017-02-15 MED ORDER — DEXTROSE 5 % IV SOLN
1.0000 g | Freq: Once | INTRAVENOUS | Status: AC
  Administered 2017-02-15: 1 g via INTRAVENOUS
  Filled 2017-02-15: qty 10

## 2017-02-15 MED ORDER — QUETIAPINE FUMARATE 25 MG PO TABS: 12.5000 mg | ORAL_TABLET | Freq: Every day | ORAL | Status: DC

## 2017-02-15 MED ORDER — ASPIRIN EC 81 MG PO TBEC
81.0000 mg | DELAYED_RELEASE_TABLET | Freq: Every day | ORAL | Status: DC
  Administered 2017-02-16 – 2017-03-02 (×15): 81 mg via ORAL
  Filled 2017-02-15 (×15): qty 1

## 2017-02-15 MED ORDER — PANTOPRAZOLE SODIUM 40 MG PO TBEC
40.0000 mg | DELAYED_RELEASE_TABLET | Freq: Every day | ORAL | Status: DC
  Administered 2017-02-16 – 2017-03-02 (×15): 40 mg via ORAL
  Filled 2017-02-15 (×15): qty 1

## 2017-02-15 MED ORDER — SERTRALINE HCL 50 MG PO TABS
25.0000 mg | ORAL_TABLET | Freq: Every day | ORAL | Status: DC
  Administered 2017-02-16 – 2017-03-02 (×15): 25 mg via ORAL
  Filled 2017-02-15 (×15): qty 1

## 2017-02-15 MED ORDER — ONDANSETRON HCL 4 MG/2ML IJ SOLN: 4.0000 mg | Freq: Four times a day (QID) | INTRAMUSCULAR | Status: DC | PRN

## 2017-02-15 MED ORDER — LEVOFLOXACIN 500 MG PO TABS: ORAL_TABLET | ORAL | 0 refills | Status: DC

## 2017-02-15 MED ORDER — ACETAMINOPHEN 650 MG RE SUPP: 650.0000 mg | Freq: Four times a day (QID) | RECTAL | Status: DC | PRN

## 2017-02-15 MED ORDER — QUETIAPINE FUMARATE 25 MG PO TABS: 12.5000 mg | ORAL_TABLET | Freq: Every day | ORAL | 0 refills | Status: DC

## 2017-02-15 MED ORDER — CYCLOBENZAPRINE HCL 5 MG PO TABS
5.0000 mg | ORAL_TABLET | Freq: Three times a day (TID) | ORAL | Status: DC | PRN
  Administered 2017-02-15 – 2017-02-18 (×7): 5 mg via ORAL
  Filled 2017-02-15 (×7): qty 1

## 2017-02-15 MED ORDER — QUETIAPINE FUMARATE 25 MG PO TABS
12.5000 mg | ORAL_TABLET | Freq: Every day | ORAL | Status: DC
  Administered 2017-02-15 – 2017-03-01 (×15): 12.5 mg via ORAL
  Filled 2017-02-15 (×15): qty 1

## 2017-02-15 MED ORDER — ACETAMINOPHEN 325 MG PO TABS
650.0000 mg | ORAL_TABLET | Freq: Four times a day (QID) | ORAL | Status: DC | PRN
  Administered 2017-02-15 – 2017-02-23 (×7): 650 mg via ORAL
  Filled 2017-02-15 (×7): qty 2

## 2017-02-15 MED ORDER — CYCLOBENZAPRINE HCL 5 MG PO TABS: 5.0000 mg | ORAL_TABLET | Freq: Three times a day (TID) | ORAL | 0 refills | Status: DC | PRN

## 2017-02-15 MED ORDER — MUSCLE RUB 10-15 % EX CREA
TOPICAL_CREAM | CUTANEOUS | Status: DC | PRN
  Administered 2017-02-16 – 2017-02-23 (×2): via TOPICAL
  Filled 2017-02-15 (×2): qty 85

## 2017-02-15 MED ORDER — FINASTERIDE 5 MG PO TABS
5.0000 mg | ORAL_TABLET | Freq: Every day | ORAL | Status: DC
  Administered 2017-02-16 – 2017-03-02 (×15): 5 mg via ORAL
  Filled 2017-02-15 (×16): qty 1

## 2017-02-15 NOTE — [place] FL2 (Signed)
[place] MEDICAID FL2 LEVEL OF CARE SCREENING TOOL     IDENTIFICATION  Patient Name: [name] Birthdate: 1934/02/11 Sex: male Admission Date (Current Location): 02/13/2017  [place] and [place] Number:  Chiropodist and Address:  [facility], [address], [place], [place] [address]      Provider Number: [id number]  Attending Physician Name and Address:  Alford Highland, MD  Relative Name and Phone Number:       Current Level of Care: Hospital Recommended Level of Care: Skilled Nursing Facility Prior Approval Number:    Date Approved/Denied:   PASRR Number: [id number]  Discharge Plan: SNF    Current Diagnoses: Patient Active Problem List   Diagnosis Date Noted  . Acute cerebral infarction (HCC)   . Encephalopathy 02/13/2017  . Tachycardia 04/20/2016    Orientation RESPIRATION BLADDER Height & Weight     Self, Time, Situation, Place  Normal Continent Weight: 174 lb (78.9 kg) Height:  5\' 11"  (180.3 cm)  BEHAVIORAL SYMPTOMS/MOOD NEUROLOGICAL BOWEL NUTRITION STATUS      Continent Diet (Heart Healthy, Thin Liquids)  AMBULATORY STATUS COMMUNICATION OF NEEDS Skin   Extensive Assist Verbally Normal                       Personal Care Assistance Level of Assistance  Bathing, Feeding, Dressing Bathing Assistance: Limited assistance Feeding assistance: Independent Dressing Assistance: Limited assistance     Functional Limitations Info  Sight, Hearing, Speech Sight Info: Adequate Hearing Info: Adequate Speech Info: Adequate    SPECIAL CARE FACTORS FREQUENCY  PT (By licensed PT), OT (By licensed OT)     PT Frequency: 5 OT Frequency: 5            Contractures Contractures Info: Not present    Additional Factors Info  Code Status, Allergies, Psychotropic Code Status Info: DNR Allergies Info: No known allergies Psychotropic Info: Medications:  Zoloft, Seroquel         Current Medications  (02/15/2017):  This is the current hospital active medication list Current Facility-Administered Medications  Medication Dose Route Frequency Provider Last Rate Last Dose  . acetaminophen (TYLENOL) tablet 650 mg  650 mg Oral Q6H PRN Enid Baas, MD   650 mg at 02/13/17 1424   Or  . acetaminophen (TYLENOL) suppository 650 mg  650 mg Rectal Q6H PRN Enid Baas, MD      . apixaban (ELIQUIS) tablet 5 mg  5 mg Oral BID Gardner Candle, RPH   5 mg at 02/15/17 0934  . aspirin EC tablet 81 mg  81 mg Oral Daily Enid Baas, MD   81 mg at 02/15/17 0934  . atorvastatin (LIPITOR) tablet 80 mg  80 mg Oral Daily Enid Baas, MD   80 mg at 02/15/17 0934  . cefTRIAXone (ROCEPHIN) 1 g in dextrose 5 % 50 mL IVPB  1 g Intravenous Q24H Enid Baas, MD   Stopped at 02/14/17 1923  . cholecalciferol (VITAMIN D) tablet 1,000 Units  1,000 Units Oral Daily Enid Baas, MD   1,000 Units at 02/15/17 0934  . cyclobenzaprine (FLEXERIL) tablet 5 mg  5 mg Oral TID PRN Enid Baas, MD   5 mg at 02/15/17 0950  . docusate sodium (COLACE) capsule 100 mg  100 mg Oral Daily PRN Enid Baas, MD      . ferrous sulfate tablet 325 mg  325 mg Oral Q breakfast Enid Baas, MD   325 mg at 02/15/17 0820  .  finasteride (PROSCAR) tablet 5 mg  5 mg Oral Daily Enid BaasKalisetti, Radhika, MD   5 mg at 02/15/17 0933  . ondansetron (ZOFRAN) tablet 4 mg  4 mg Oral Q6H PRN Enid BaasKalisetti, Radhika, MD       Or  . ondansetron (ZOFRAN) injection 4 mg  4 mg Intravenous Q6H PRN Enid BaasKalisetti, Radhika, MD      . pantoprazole (PROTONIX) EC tablet 40 mg  40 mg Oral Daily Enid BaasKalisetti, Radhika, MD   40 mg at 02/15/17 0933  . QUEtiapine (SEROQUEL) tablet 12.5 mg  12.5 mg Oral QHS Wieting, Richard, MD      . sertraline (ZOLOFT) tablet 25 mg  25 mg Oral Daily Enid BaasKalisetti, Radhika, MD   25 mg at 02/15/17 0934  . traMADol (ULTRAM) tablet 50 mg  50 mg Oral Q6H PRN Enid BaasKalisetti, Radhika, MD   50 mg at 02/14/17 0930      Discharge Medications: Please see discharge summary for a list of discharge medications.  Relevant Imaging Results:  Relevant Lab Results:   Additional Information SSN:  [id number]  Dede QuerySarah Calais Svehla, LCSW

## 2017-02-15 NOTE — Discharge Summary (Signed)
[employer] - [place] at [facility]   PATIENT NAME: [name]    MR#:  [id number]  DATE OF BIRTH:  28-Mar-1934  DATE OF ADMISSION:  02/13/2017 ADMITTING PHYSICIAN: Enid Baasadhika Kalisetti, MD  DATE OF DISCHARGE: 02/15/2017  PRIMARY CARE PHYSICIAN: [facility]    ADMISSION DIAGNOSIS:  Swelling [R60.9] Orchitis [N45.2] Stroke (cerebrum) (HCC) [I63.9] Acute cystitis without hematuria [N30.00] Transient cerebral ischemia, unspecified type [G45.9]  DISCHARGE DIAGNOSIS:  Active Problems:   Encephalopathy   Acute cerebral infarction (HCC)   SECONDARY DIAGNOSIS:   Past Medical History:  Diagnosis Date  . A-fib (HCC)   . GERD (gastroesophageal reflux disease)   . Hypercholesteremia   . Stroke [facility](HCC)     HOSPITAL COURSE:   1. Acute CVA right MCA distribution and watershed areas. Old left MCA infarcts, old left basal ganglia infarct. Extensive peripheral chronic microhemorrhages. Areas consistent with amyloid angiopathy. Moderate chronic small vessel disease. The patient was initially hypotensive and given IV fluid hydration. Blood pressure responded. Left eye affected with the stroke. Physical therapy recommended acute rehabilitation. Echocardiogram still pending at the time of dictation. Patient already on Eliquis, aspirin and high-dose atorvastatin. 2. Clinical sepsis with orchitis and acute cystitis with hematuria. Escherichia coli growing out of the urine culture. Patient was given Rocephin while here in the hospital we'll switch over to Levaquin upon going out to rehabilitation. 3. Relative hypotension. Patient given IV fluid bolus and blood pressure responded. Will restart Flomax at rehabilitation. Last blood pressure actually elevated. Allow this to be elevated in the setting of acute stroke. 4. GERD on PPI 5. Depression on Zoloft 6. History of atrial fibrillation. Currently in normal sinus rhythm with rate control and anticoagulated with  Eliquis 7. Patient made a DO NOT RESUSCITATE 8. Neck pain likely musculoskeletal with fall. When necessary Flexeril and tramadol  DISCHARGE CONDITIONS:   Satisfactory  CONSULTS OBTAINED:  Treatment Team:  Kym [facility]osp, Neuro1, MD Thana Farreynolds, Leslie, MD  DRUG ALLERGIES:  No Known Allergies  DISCHARGE MEDICATIONS:   Current Discharge Medication List    START taking these medications   Details  cyclobenzaprine (FLEXERIL) 5 MG tablet Take 1 tablet (5 mg total) by mouth 3 (three) times daily as needed for muscle spasms. Qty: 30 tablet, Refills: 0    levofloxacin (LEVAQUIN) 500 MG tablet One tablet daily for 8 more days Qty: 8 tablet, Refills: 0    QUEtiapine (SEROQUEL) 25 MG tablet Take 0.5 tablets (12.5 mg total) by mouth at bedtime. Qty: 30 tablet, Refills: 0    traMADol (ULTRAM) 50 MG tablet Take 1 tablet (50 mg total) by mouth every 6 (six) hours as needed for moderate pain. Qty: 15 tablet, Refills: 0      CONTINUE these medications which have NOT CHANGED   Details  apixaban (ELIQUIS) 5 MG TABS tablet Take 5 mg by mouth 2 (two) times daily.    aspirin EC 81 MG tablet Take 81 mg by mouth daily.    atorvastatin (LIPITOR) 80 MG tablet Take 80 mg by mouth daily.     Cholecalciferol 1000 units TBDP Take 1 tablet by mouth daily.    ferrous sulfate 325 (65 FE) MG tablet Take 325 mg by mouth daily with breakfast.    finasteride (PROSCAR) 5 MG tablet Take 5 mg by mouth daily.    omeprazole (PRILOSEC) 20 MG capsule Take 20 mg by mouth daily.    sertraline (ZOLOFT) 25 MG tablet Take 25 mg by mouth daily.    tamsulosin (  FLOMAX) 0.4 MG CAPS capsule Take 0.4 mg by mouth.    docusate sodium (COLACE) 100 MG capsule Take 100 mg by mouth daily as needed for mild constipation.      STOP taking these medications     ciprofloxacin (CIPRO) 500 MG tablet          DISCHARGE INSTRUCTIONS:   Follow-up with Dr. acute rehabilitation 1-2 days  If you experience worsening of your  admission symptoms, develop shortness of breath, life threatening emergency, suicidal or homicidal thoughts you must seek medical attention immediately by calling 911 or calling your MD immediately  if symptoms less severe.  You Must read complete instructions/literature along with all the possible adverse reactions/side effects for all the Medicines you take and that have been prescribed to you. Take any new Medicines after you have completely understood and accept all the possible adverse reactions/side effects.   Please note  You were cared for by a hospitalist during your hospital stay. If you have any questions about your discharge medications or the care you received while you were in the hospital after you are discharged, you can call the unit and asked to speak with the hospitalist on call if the hospitalist that took care of you is not available. Once you are discharged, your primary care physician will handle any further medical issues. Please note that NO REFILLS for any discharge medications will be authorized once you are discharged, as it is imperative that you return to your primary care physician (or establish a relationship with a primary care physician if you do not have one) for your aftercare needs so that they can reassess your need for medications and monitor your lab values.    Today   CHIEF COMPLAINT:   Chief Complaint  Patient presents with  . Altered Mental Status    HISTORY OF PRESENT ILLNESS:  [name]  is a 83 y.o. male brought in with altered mental status, testicular pain and after fall   VITAL SIGNS:  Blood pressure (!) 160/75, pulse (!) 59, temperature 98 F (36.7 C), temperature source Oral, resp. rate 20, height 5\' 11"  (1.803 m), weight 78.9 kg (174 lb), SpO2 95 %.   PHYSICAL EXAMINATION:  GENERAL:  83 y.o.-year-old patient lying in the bed with no acute distress.  EYES: Pupils equal, round, reactive to light and accommodation. No scleral  icterus. Extraocular muscles intact.  HEENT: Head atraumatic, normocephalic. Oropharynx and nasopharynx clear.  NECK:  Supple, no jugular venous distention.Limited range of motion with his neck LUNGS: Normal breath sounds bilaterally, no wheezing, rales,rhonchi or crepitation. No use of accessory muscles of respiration.  CARDIOVASCULAR: S1, S2 normal. No murmurs, rubs, or gallops.  ABDOMEN: Soft, non-tender, non-distended. Bowel sounds present. No organomegaly or mass.  EXTREMITIES: No pedal edema, cyanosis, or clubbing. Limited range of motion in his neck NEUROLOGIC: Poor vision left eye. Muscle strength 5/5 in all extremities. Sensation intact. Gait not checked.  PSYCHIATRIC: The patient is alert and oriented x 3.  SKIN: Bruising over right eye and around right eye  DATA REVIEW:   CBC  Recent Labs Lab 02/14/17 0547  WBC 20.1*  HGB 12.1*  HCT 36.3*  PLT 220    Chemistries   Recent Labs Lab 02/13/17 0908 02/14/17 0547  NA 138 135  K 4.2 3.6  CL 105 104  CO2 27 25  GLUCOSE 124* 112*  BUN 14 17  CREATININE 0.92 1.04  CALCIUM 8.9 8.5*  AST 28  --  ALT 28  --   ALKPHOS 79  --   BILITOT 0.7  --     Cardiac Enzymes  Recent Labs Lab 02/13/17 0908  TROPONINI <0.03    Microbiology Results  Results for orders placed or performed during the hospital encounter of 02/13/17  Urine Culture     Status: Abnormal   Collection Time: 02/13/17 10:57 AM  Result Value Ref Range Status   Specimen Description URINE, RANDOM  Final   Special Requests NONE  Final   Culture >=100,000 COLONIES/mL ESCHERICHIA COLI (A)  Final   Report Status 02/15/2017 FINAL  Final   Organism ID, Bacteria ESCHERICHIA COLI (A)  Final      Susceptibility   Escherichia coli - MIC*    AMPICILLIN >=32 RESISTANT Resistant     CEFAZOLIN <=4 SENSITIVE Sensitive     CEFTRIAXONE <=1 SENSITIVE Sensitive     CIPROFLOXACIN <=0.25 SENSITIVE Sensitive     GENTAMICIN <=1 SENSITIVE Sensitive     IMIPENEM <=0.25  SENSITIVE Sensitive     NITROFURANTOIN 32 SENSITIVE Sensitive     TRIMETH/SULFA <=20 SENSITIVE Sensitive     AMPICILLIN/SULBACTAM 8 SENSITIVE Sensitive     PIP/TAZO 8 SENSITIVE Sensitive     Extended ESBL NEGATIVE Sensitive     * >=100,000 COLONIES/mL ESCHERICHIA COLI  CULTURE, BLOOD (ROUTINE X 2) w Reflex to ID Panel     Status: None (Preliminary result)   Collection Time: 02/13/17  4:27 PM  Result Value Ref Range Status   Specimen Description BLOOD RIGHT HAND  Final   Special Requests   Final    BOTTLES DRAWN AEROBIC AND ANAEROBIC Blood Culture adequate volume   Culture NO GROWTH 2 DAYS  Final   Report Status PENDING  Incomplete  CULTURE, BLOOD (ROUTINE X 2) w Reflex to ID Panel     Status: None (Preliminary result)   Collection Time: 02/13/17  4:27 PM  Result Value Ref Range Status   Specimen Description BLOOD LEFT ASSIST CONTROL  Final   Special Requests   Final    BOTTLES DRAWN AEROBIC AND ANAEROBIC Blood Culture adequate volume   Culture NO GROWTH 2 DAYS  Final   Report Status PENDING  Incomplete    RADIOLOGY:  Mr Brain Wo Contrast  Result Date: 02/13/2017 CLINICAL DATA:  Altered mental status, possible syncopal episode on toilet. History of stroke, atrial fibrillation on anticoagulation. EXAM: MRI HEAD WITHOUT CONTRAST TECHNIQUE: Multiplanar, multiecho pulse sequences of the brain and surrounding structures were obtained without intravenous contrast. COMPARISON:  CT HEAD February 13, 2017 at 0919 hours an MRI head July 08, 2012 FINDINGS: Mild motion degraded examination. BRAIN: Patchy reduced diffusion RIGHT temporal occipital lobe and to lesser extent RIGHT parietal lobe with low ADC values. Numerous foci of susceptibility artifact and predominately peripheral distribution. Patchy LEFT subarachnoid susceptibility artifact. Minimal peripheral LEFT temporal encephalomalacia. Similar LEFT frontotemporal lobe encephalomalacia. Moderate ventriculomegaly on the basis of global  parenchymal brain volume loss. Patchy supratentorial white matter T2 hyperintensities exclusive of the aforementioned abnormality. Old LEFT basal ganglia hemorrhagic infarct. Old pontine lacunar infarcts. VASCULAR: Normal major intracranial vascular flow voids present at skull base. SKULL AND UPPER CERVICAL SPINE: No abnormal sellar expansion. No suspicious calvarial bone marrow signal. Craniocervical junction maintained. SINUSES/ORBITS: Mild paranasal sinus mucosal thickening. LEFT mastoid effusion. The included ocular globes and orbital contents are non-suspicious. OTHER: None. IMPRESSION: 1. Acute small scattered RIGHT MCA and posterior watershed territory infarcts. 2. Old LEFT MCA infarcts, old LEFT basal ganglia hemorrhagic infarct. 3.  Extensive peripheral chronic micro hemorrhages and artifact and patchy LEFT superficial siderosis, findings consistent with amyloid angiopathy. 4. Moderate chronic small vessel ischemic disease. Electronically Signed   By: Awilda Metro M.D.   On: 02/13/2017 20:21       Management plans discussed with the patient, family and they are in agreement.  CODE STATUS:     Code Status Orders        Start     Ordered   02/14/17 1323  Do not attempt resuscitation (DNR)  Continuous    Question Answer Comment  In the event of cardiac or respiratory ARREST Do not call a "code blue"   In the event of cardiac or respiratory ARREST Do not perform Intubation, CPR, defibrillation or ACLS   In the event of cardiac or respiratory ARREST Use medication by any route, position, wound care, and other measures to relive pain and suffering. May use oxygen, suction and manual treatment of airway obstruction as needed for comfort.   Comments nurse may pronounce      02/14/17 1322    Code Status History    Date Active Date Inactive Code Status Order ID Comments User Context   02/13/2017  4:11 PM 02/14/2017  1:22 PM Full Code [id number]  Enid Baas, MD Inpatient    04/20/2016  7:48 PM 04/21/2016  7:57 AM Full Code [id number]  Auburn Bilberry, MD ED    Advance Directive Documentation     Most Recent Value  Type of Advance Directive  Healthcare Power of Attorney, Living will  Pre-existing out of facility DNR order (yellow form or pink MOST form)  -  "MOST" Form in Place?  -      TOTAL TIME TAKING CARE OF THIS PATIENT: 40 minutes.    Alford Highland M.D on 02/15/2017 at 1:29 PM  Between 7am to 6pm - Pager - [phone]  After 6pm go to [email or web address] - Social research officer, government  [employer] Office  [phone]  CC: Primary care physician; [facility]

## 2017-02-15 NOTE — Care Management (Signed)
Fae PippinMelissa Bowie, inpatient Acute Rehabilitation representative spoke with daughter and other family members about going to their facility. Family is discussing this disposition  Will let care management know their disposition. Gwenette GreetBrenda S Robie Mcniel RN MSN CCM Care Management [phone]

## 2017-02-15 NOTE — Care Management (Signed)
[facility]  [place], daughter, updated. [facility] will transport Gwenette GreetBrenda S Kailyn Dubie RN MSN CCM Care Management [phone]

## 2017-02-15 NOTE — H&P (Signed)
Physical Medicine and Rehabilitation Admission H&P    Chief Complaint  Patient presents with  . Altered Mental Status  : HPI: 83 year old right-handed male with history of CVA no residual, history of atrial fibrillation on Eliquis. Per chart review patient lives alone independently with assistive device prior to admission. One level home 3 steps to entry. Presented 02/13/2017 with altered mental status as well as chills and a fall while going to the bathroom. Cranial CT scan negative for acute changes. There was a right frontal scalp hematoma. CT angiogram head and neck showed no emergent large vessel occlusion. CT cervical spine negative. MRI of the brain showed acute small scattered right MCA and posterior watershed territory infarct. Patient did not receive TPA. Echocardiogram pending. EEG showed no seizure activity. Patient remains on Eliquis for CVA prophylaxis. Urine culture 100,000 Escherichia coli maintained on Rocephin. Physical and occupational therapy evaluations completed patient was admitted for comprehensive rehabilitation program.  Review of Systems  Constitutional: Negative for chills and fever.  HENT: Negative for hearing loss.   Eyes: Negative for blurred vision and double vision.  Respiratory: Negative for cough and shortness of breath.   Cardiovascular: Positive for palpitations and leg swelling. Negative for chest pain.  Gastrointestinal: Positive for constipation. Negative for nausea and vomiting.       GERD  Genitourinary: Positive for urgency. Negative for dysuria and hematuria.  Musculoskeletal: Positive for falls, joint pain and myalgias.  Skin: Negative for rash.  Neurological: Positive for weakness. Negative for seizures.  Psychiatric/Behavioral: Positive for depression.  All other systems reviewed and are negative.  Past Medical History:  Diagnosis Date  . A-fib ([place])   . GERD (gastroesophageal reflux disease)   . Hypercholesteremia   . Stroke [facility])     Past Surgical History:  Procedure Laterality Date  . TOTAL HIP ARTHROPLASTY Left    Family History  Problem Relation Age of Onset  . Hypertension Father    Social History:  reports that he has never smoked. He has never used smokeless tobacco. He reports that he does not drink alcohol or use drugs. Allergies: No Known Allergies Medications Prior to Admission  Medication Sig Dispense Refill  . apixaban (ELIQUIS) 5 MG TABS tablet Take 5 mg by mouth 2 (two) times daily.    Marland Kitchen aspirin EC 81 MG tablet Take 81 mg by mouth daily.    Marland Kitchen atorvastatin (LIPITOR) 80 MG tablet Take 80 mg by mouth daily.     . Cholecalciferol 1000 units TBDP Take 1 tablet by mouth daily.    . ferrous sulfate 325 (65 FE) MG tablet Take 325 mg by mouth daily with breakfast.    . finasteride (PROSCAR) 5 MG tablet Take 5 mg by mouth daily.    Marland Kitchen omeprazole (PRILOSEC) 20 MG capsule Take 20 mg by mouth daily.    . sertraline (ZOLOFT) 25 MG tablet Take 25 mg by mouth daily.    . tamsulosin (FLOMAX) 0.4 MG CAPS capsule Take 0.4 mg by mouth.    . ciprofloxacin (CIPRO) 500 MG tablet Take 1 tablet (500 mg total) by mouth 2 (two) times daily. 14 tablet 0  . docusate sodium (COLACE) 100 MG capsule Take 100 mg by mouth daily as needed for mild constipation.      Home: Home Living Family/patient expects to be discharged to:: Private residence Living Arrangements: Alone Available Help at Discharge: Family Type of Home: House Home Access: Stairs to enter [employer] of Steps: 3 to enter with hand  rails on both sides Entrance Stairs-Rails: Right, Left, Can reach both Home Layout: One level, Other (Comment) (3 steps down to kitchen with B hand rails) Bathroom Shower/Tub: Chiropodist: Handicapped height Bathroom Accessibility: Yes Home Equipment: [place] - single point, Environmental consultant - 2 wheels, Bedside commode, Walker - 4 wheels Additional Comments: Patient uses BSC over toilet at home   Functional  History: Prior Function Level of Independence: Independent with assistive device(s) Comments: Pt ambulates with RW inside of home and SPC in community.  Family reports 2 falls in past 6 months (both times pt diagnosed with an acute stroke)  Functional Status:  Mobility: Bed Mobility Overal bed mobility: Needs Assistance Bed Mobility: Supine to Sit Supine to sit: Supervision, HOB elevated General bed mobility comments: SBA for safety sitting up on L side of bed Transfers Overall transfer level: Needs assistance Equipment used: Rolling walker (2 wheeled) Transfers: Sit to/from Stand Sit to Stand: Min guard, Min assist General transfer comment: x2 trials from bed and x2 trials from recliner; vc's for hand placement; pt leaning to L when sitting down Ambulation/Gait Ambulation/Gait assistance: Min guard, Min assist Ambulation Distance (Feet): 80 Feet Assistive device: Rolling walker (2 wheeled) Gait Pattern/deviations: Step-through pattern General Gait Details: mild decreased stance time L LE; decreased B step length (L more decreased than R); decreased L heel strike; pt with difficulty  navigating past objects on L side d/t L visual impairements; pt standing more on L side of walker (not midline) and had difficulty staying within walker with turns (L side of body on outside of walker) Gait velocity: decreased    ADL: ADL Overall ADL's : Needs assistance/impaired Eating/Feeding: Set up, Minimal assistance Eating/Feeding Details (indicate cue type and reason): Patient demonstrating difficulty with self feeding, increased spillage, difficulty with finding items on plate towards the left side.   Grooming: Minimal assistance, Sitting Grooming Details (indicate cue type and reason): cues for items placed to the left Upper Body Bathing: Minimal assistance Lower Body Bathing: Moderate assistance Lower Body Bathing Details (indicate cue type and reason): Patient had a hip replacement about 7  months ago but reports he has been able to reach down more to wash his legs in recent months.  Upper Body Dressing : Set up, Minimal assistance Lower Body Dressing: Set up, Minimal assistance Lower Body Dressing Details (indicate cue type and reason): min assist for standing to perform clothing negotiation. Toilet Transfer: Minimal assistance, Cueing for safety Toileting- Clothing Manipulation and Hygiene: Minimal assistance General ADL Comments: Patient with decreased balance in standing and needing to hold on to therapist.  He reports he was just about to graduate from the cane with PT prior to admission.     Cognition: Cognition Overall Cognitive Status: Within Functional Limits for tasks assessed Orientation Level: Oriented X4 Cognition Arousal/Alertness: Awake/alert Behavior During Therapy: Anxious Overall Cognitive Status: Within Functional Limits for tasks assessed General Comments: Pt's family reports pt has some memory deficits (baseline)  Physical Exam: Blood pressure (!) 160/75, pulse (!) 59, temperature 98 F (36.7 C), temperature source Oral, resp. rate 20, height 5' 11"  (1.803 m), weight 78.9 kg (174 lb), SpO2 95 %. Physical Exam  HENT:  Head: Normocephalic.  Eyes: Pupils are equal, round, and reactive to light. Conjunctivae and EOM are normal.  Neck: Normal range of motion. Neck supple. No tracheal deviation present. No thyromegaly present.  Cardiovascular: Normal rate and regular rhythm.   Cardiac rate control  Respiratory: Effort normal and breath sounds normal. No respiratory  distress. He has no wheezes. He has no rales.  GI: Soft. Bowel sounds are normal. He exhibits no distension. There is no tenderness. There is no rebound.  Neurological: He is alert.  Speech is fluent. Provides name and age as well as follow simple commands. Oriented to place, month, year. Fair sitting balance although leans to left. Mild left pronator drift. Motor 3+ to 4/5 UE's. LE: 4/5 prox to  distal. Sensory function grossly intact to LT/pain  Skin: Skin is warm and dry.  Skin. Warm and dry  Results for orders placed or performed during the hospital encounter of 02/13/17 (from the past 48 hour(s))  CULTURE, BLOOD (ROUTINE X 2) w Reflex to ID Panel     Status: None (Preliminary result)   Collection Time: 02/13/17  4:27 PM  Result Value Ref Range   Specimen Description BLOOD RIGHT HAND    Special Requests      BOTTLES DRAWN AEROBIC AND ANAEROBIC Blood Culture adequate volume   Culture NO GROWTH 2 DAYS    Report Status PENDING   CULTURE, BLOOD (ROUTINE X 2) w Reflex to ID Panel     Status: None (Preliminary result)   Collection Time: 02/13/17  4:27 PM  Result Value Ref Range   Specimen Description BLOOD LEFT ASSIST CONTROL    Special Requests      BOTTLES DRAWN AEROBIC AND ANAEROBIC Blood Culture adequate volume   Culture NO GROWTH 2 DAYS    Report Status PENDING   Basic metabolic panel     Status: Abnormal   Collection Time: 02/14/17  5:47 AM  Result Value Ref Range   Sodium 135 135 - 145 mmol/L   Potassium 3.6 3.5 - 5.1 mmol/L   Chloride 104 101 - 111 mmol/L   CO2 25 22 - 32 mmol/L   Glucose, Bld 112 (H) 65 - 99 mg/dL   BUN 17 6 - 20 mg/dL   Creatinine, Ser 1.04 0.61 - 1.24 mg/dL   Calcium 8.5 (L) 8.9 - 10.3 mg/dL   GFR calc non Af Amer >60 >60 mL/min   GFR calc Af Amer >60 >60 mL/min    Comment: (NOTE) The eGFR has been calculated using the CKD EPI equation. This calculation has not been validated in all clinical situations. eGFR's persistently <60 mL/min signify possible Chronic Kidney Disease.    Anion gap 6 5 - 15  CBC     Status: Abnormal   Collection Time: 02/14/17  5:47 AM  Result Value Ref Range   WBC 20.1 (H) 3.8 - 10.6 K/uL   RBC 4.20 (L) 4.40 - 5.90 MIL/uL   Hemoglobin 12.1 (L) 13.0 - 18.0 g/dL   HCT 36.3 (L) 40.0 - 52.0 %   MCV 86.3 80.0 - 100.0 fL   MCH 28.8 26.0 - 34.0 pg   MCHC 33.3 32.0 - 36.0 g/dL   RDW 17.7 (H) 11.5 - 14.5 %   Platelets  220 150 - 440 K/uL   Mr Brain Wo Contrast  Result Date: 02/13/2017 CLINICAL DATA:  Altered mental status, possible syncopal episode on toilet. History of stroke, atrial fibrillation on anticoagulation. EXAM: MRI HEAD WITHOUT CONTRAST TECHNIQUE: Multiplanar, multiecho pulse sequences of the brain and surrounding structures were obtained without intravenous contrast. COMPARISON:  CT HEAD February 13, 2017 at 0919 hours an MRI head July 08, 2012 FINDINGS: Mild motion degraded examination. BRAIN: Patchy reduced diffusion RIGHT temporal occipital lobe and to lesser extent RIGHT parietal lobe with low ADC values. Numerous foci of susceptibility  artifact and predominately peripheral distribution. Patchy LEFT subarachnoid susceptibility artifact. Minimal peripheral LEFT temporal encephalomalacia. Similar LEFT frontotemporal lobe encephalomalacia. Moderate ventriculomegaly on the basis of global parenchymal brain volume loss. Patchy supratentorial white matter T2 hyperintensities exclusive of the aforementioned abnormality. Old LEFT basal ganglia hemorrhagic infarct. Old pontine lacunar infarcts. VASCULAR: Normal major intracranial vascular flow voids present at skull base. SKULL AND UPPER CERVICAL SPINE: No abnormal sellar expansion. No suspicious calvarial bone marrow signal. Craniocervical junction maintained. SINUSES/ORBITS: Mild paranasal sinus mucosal thickening. LEFT mastoid effusion. The included ocular globes and orbital contents are non-suspicious. OTHER: None. IMPRESSION: 1. Acute small scattered RIGHT MCA and posterior watershed territory infarcts. 2. Old LEFT MCA infarcts, old LEFT basal ganglia hemorrhagic infarct. 3. Extensive peripheral chronic micro hemorrhages and artifact and patchy LEFT superficial siderosis, findings consistent with amyloid angiopathy. 4. Moderate chronic small vessel ischemic disease. Electronically Signed   By: Elon Alas M.D.   On: 02/13/2017 20:21       Medical  Problem List and Plan: 1.  Decreased functional mobility secondary to acute small scattered right MCA and posterior watershed territory infarcts.  -begin therapies today 2.  DVT Prophylaxis/Anticoagulation: Eliquis 3. Pain Management: Flexeril and Ultram as needed. Monitor mental status 4. Mood: Zoloft 25 mg daily, Seroquel 12.5 mg daily at bedtime 5. Neuropsych: This patient is capable of making decisions on his own behalf. 6. Skin/Wound Care: Routine skin checks 7. Fluids/Electrolytes/Nutrition: Routine I&O with follow-up chemistries 8. Atrial fibrillation. Cardiac rate control 9. Escherichia coli UTI. Completed course of Rocephin 10. Hyperlipidemia. Lipitor 11. BPH. Pro scar 5 mg daily. Check PVR 3  Post Admission Physician Evaluation: 1. Functional deficits secondary  to right MCA/posterior watershed territory infarcts 2. Patient is admitted to receive collaborative, interdisciplinary care between the physiatrist, rehab nursing staff, and therapy team. 3. Patient's level of medical complexity and substantial therapy needs in context of that medical necessity cannot be provided at a lesser intensity of care such as a SNF. 4. Patient has experienced substantial functional loss from his/her baseline which was documented above under the "Functional History" and "Functional Status" headings.  Judging by the patient's diagnosis, physical exam, and functional history, the patient has potential for functional progress which will result in measurable gains while on inpatient rehab.  These gains will be of substantial and practical use upon discharge  in facilitating mobility and self-care at the household level. 5. Physiatrist will provide 24 hour management of medical needs as well as oversight of the therapy plan/treatment and provide guidance as appropriate regarding the interaction of the two. 6. The Preadmission Screening has been reviewed and patient status is unchanged unless otherwise stated  above. 7. 24 hour rehab nursing will assist with bladder management, bowel management, safety, skin/wound care, disease management, medication administration, pain management and patient education  and help integrate therapy concepts, techniques,education, etc. 8. PT will assess and treat for/with: Lower extremity strength, range of motion, stamina, balance, functional mobility, safety, adaptive techniques and equipment, NMR, visual-spatial awareness.   Goals are: mod I to supervision. 9. OT will assess and treat for/with: ADL's, functional mobility, safety, upper extremity strength, adaptive techniques and equipment,NMR, visual-spatial awareness, family education, .   Goals are: mod I to supervision. Therapy may proceed with showering this patient. 10. SLP will assess and treat for/with: cognition, communication, behavior, family ed.  Goals are: mod I to supervision. 11. Case Management and Social Worker will assess and treat for psychological issues and discharge planning. 12. Team conference will  be held weekly to assess progress toward goals and to determine barriers to discharge. 13. Patient will receive at least 3 hours of therapy per day at least 5 days per week. 14. ELOS: 14-16 days       15. Prognosis:  excellent     Meredith Staggers, MD, [facility] 02/16/2017  Cathlyn Parsons., PA-C 02/15/2017

## 2017-02-15 NOTE — Care Management (Signed)
Informed by Dr. Renae GlossWieting that the family of Mr. [name] would [facility] Laban EmperorMelissa Bowie, [place] representative updated per voice mail Gwenette GreetBrenda S Laketha Leopard RN MSN [facility] Care Management [phone]

## 2017-02-15 NOTE — PMR Pre-admission (Signed)
[place] PMR Admission Coordinator Pre-Admission Assessment  Patient: [name] is an 83 y.o., male MRN: [id number] DOB: January 26, 1934 Height: 5' 11"  (180.3 cm) Weight: 78.9 kg (174 lb)  Insurance Information HMO:     PPO:      PCP:      IPA:      80/20:      OTHER:  PRIMARY: Medicare A &B       Policy#: [id number]      Subscriber: Self  CM Name:       Phone#:      Fax#:  Pre-Cert#: eligible via [employer]      Employer: Retired  Benefits:  Phone #: verified online      Name:  Eff. Date: 06/07/01     Deduct: $1349      Out of Pocket Max: none      Life Max: none CIR: 100%      SNF: 100% days 1-20; 80% days 21-100 Outpatient: 80%     Co-Pay: 20% Home Health: 100%      Co-Pay: none DME: 80%     Co-Pay: 20% Providers: patient's choice   SECONDARY: Reports he has [facility] need to obtain info Policy#:       Subscriber:  CM Name:       Phone#:      Fax#:  Pre-Cert#:       Employer:  Benefits:  Phone #:      Name:  Eff. Date:      Deduct:       Out of Pocket Max:       Life Max:  CIR:       SNF:  Outpatient:      Co-Pay:  Home Health:       Co-Pay:  DME:      Co-Pay:   Medicaid Application Date:       Case Manager:  Disability Application Date:       Case Worker:   Emergency Contact Information Contact Information    Name Relation Home Work [place] Daughter [phone]  [phone]   Pope,Michelle Daughter [phone]  [phone]      Current Medical History  Patient Admitting Diagnosis: Acute small scattered right MCA and posterior watershed territory infarcts  History of Present Illness: 83 year old right-handed male with history of CVA no residual, history of atrial fibrillation on Eliquis. Per chart review patient lives alone independently with assistive device prior to admission. One level home 3 steps to entry. Presented 02/13/2017 with altered mental status as well as chills and a fall while going to the bathroom. Cranial CT scan  negative for acute changes. There was a right frontal scalp hematoma. CT angiogram head and neck showed no emergent large vessel occlusion. CT cervical spine negative. MRI of the brain showed acute small scattered right MCA and posterior watershed territory infarct. Patient did not receive TPA. Echocardiogram pending. EEG showed no seizure activity. Patient remains on Eliquis for CVA prophylaxis. Urine culture 100,000 Escherichia coli maintained on Rocephin. Physical and occupational therapy evaluations completed patient was admitted for comprehensive rehabilitation program.  Patient's medical record from [facility] has been reviewed by the rehabilitation admission coordinator and physician. NIH Stroke scale: 3  Past Medical History  Past Medical History:  Diagnosis Date  . A-fib ([place])   . GERD (gastroesophageal reflux disease)   . Hypercholesteremia   . Stroke [facility])     Family History   family history includes Hypertension in his father.  Prior Rehab/Hospitalizations Has the patient had major surgery during 100 days prior to admission? No   Current Medications Refer to [facility] MAR  Patients Current Diet:  Heart Healthy with thin liquids   Precautions / Restrictions Precautions Precautions: Fall Restrictions Weight Bearing Restrictions: No   Has the patient had 2 or more falls or a fall with injury in the past year?No  Prior Activity Level Community (5-7x/wk): Prior to admission patient was fully independent, driving, and living alone.  Prior Functional Level Self Care: Did the patient need help bathing, dressing, using the toilet or eating? Independent  Indoor Mobility: Did the patient need assistance with walking from room to room (with or without device)? Independent  Stairs: Did the patient need assistance with internal or external stairs (with or without device)? Independent  Functional Cognition: Did the patient need help planning regular tasks such as shopping or remembering  to take medications? Needed some help  Home Assistive Devices / [facility] Devices/Equipment: Environmental consultant (specify type), Cane (specify quad or straight), Raised toilet seat with rails Home Equipment: Cane - single point, Walker - 2 wheels, Bedside commode, Walker - 4 wheels  Prior Device Use: Indicate devices/aids used by the patient prior to current illness, exacerbation or injury? Walker and Single point cane    Prior Functional Level Current Functional Level  Bed Mobility    Independent     Min A   Transfers    Independent     Min A   Mobility - Walk/Wheelchair    Mod I     Min A   Upper Body Dressing    Independent     Min A   Lower Body Dressing    Independent     Min A-Mod A   Grooming    Independent     Supervision    Eating/Drinking    Independent     Supervision for left inattention    Toilet Transfer    Independent     Min A   Bladder Continence     Yes    Intermittent continence    Bowel Management    Continent     Continent    Stair Climbing    Mod I-Independent     Not yet assessed    Communication    Independent     Independent    Memory    Mod I     TBA appeared functional upon interview    Cooking/Meal Prep    Independent       Housework    Independent     Money Management    Some assist from kids with medical decisions     Driving    Yes      Special needs/care consideration BiPAP/CPAP: Yes he reports using intermittently  CPM: No Continuous Drip IV: No Dialysis: No         Life Vest: No Oxygen: No Special Bed: No Trach Size: No Wound Vac (area): No Skin: Bruising and abrasions to right head, eye, face                               Bowel mgmt: 7/11 continent  Bladder mgmt: Incontinent  Diabetic mgmt: No  Previous Home Environment Living Arrangements: Alone  Lives With: Alone Available Help at Discharge: Family Type of Home: House Home Layout: One level, Other (Comment) Home Access: Stairs to  enter Entrance Stairs-Rails: Right, Left, Can  reach both Entrance Stairs-Number of Steps: 3 to enter with hand rails on both sides Bathroom Shower/Tub: Chiropodist: Handicapped height Bathroom Accessibility: Yes How Accessible: Accessible via walker [place]: Yes Type of Home Care Services: Home PT (with the [facility]) [place] (if known): [facility] Additional Comments: Patient uses BSC over toilet at home  Discharge Living Setting Plans for Discharge Living Setting: Alone Type of Home at Discharge: House Discharge Home Layout: One level, Other (Comment) (with 3 steps down into the kitchen ) Discharge Home Access: Stairs to enter Entrance Stairs-Rails: Can reach both Entrance Stairs-Number of Steps: 3 Discharge Bathroom Shower/Tub: Walk-in shower Discharge Bathroom Toilet: Standard Discharge Bathroom Accessibility: Yes How Accessible: Accessible via walker Does the patient have any problems obtaining your medications?: No  Social/Family/Support Systems Patient Roles: Partner, Other (Comment) (grandparent, sibling, friend ) Sport and exercise psychologist Information: Daughter: Silverio Lay [phone] Anticipated Caregiver: Family to coordinate what is advised by team  Anticipated Caregiver's Contact Information: see above Ability/Limitations of Caregiver: they all work full or part-time Caregiver Availability: Other (Comment) (24/7 woud be hard but they are committed to caring for dad) Discharge Plan Discussed with Primary Caregiver: Yes Is Caregiver In Agreement with Plan?: Yes Does Caregiver/Family have Issues with Lodging/Transportation while Pt is in Rehab?: No  Goals/Additional Needs Patient/Family Goal for Rehab: PT/OT/SLP Mod I-Supervision  Expected length of stay: ~14 days  Cultural Considerations: [employer] Dietary Needs: Heart Healthy Restrictions Equipment Needs: TBD Special Service Needs: Has VA benefits for follow up and RX needs  Additional Information:  None Pt/Family Agrees to Admission and willing to participate: Yes Program Orientation Provided & Reviewed with Pt/Caregiver Including Roles  & Responsibilities: Yes Additional Information Needs: Education with family for discharge recommendations  Information Needs to be Provided By: Team   Patient Condition: I have reviewed the patient's medical record, met with the patient at bedside, and spoken with family via phone.  Patient currently requiring Min assist with gait, Min-Mod with bathing and dressing, and has left inattention.   Given, that patient was fully independent and active prior to admission he makes an excellent IP Rehab candidate. Patient is eager to return home as independent as possible and given his new diagnosis of a CVA he would benefit from the coordinated medical and therapy care the IP Rehab team has to offer.  Patient requires skilled PT, OT, and SLP services and can actively participate in 3 hours of therapy 5/7 days of the week.    Preadmission Screen Completed By:  Gunnar Fusi, 02/15/2017 2:20 PM ______________________________________________________________________   Discussed status with Dr. Letta Pate on 02/15/17 at 1430 and received telephone approval for admission today.  Admission Coordinator:  Gunnar Fusi, time 1430/Date 02/15/17   Assessment/Plan: Diagnosis: 1. Does the need for close, 24 hr/day  Medical supervision in concert with the patient's rehab needs make it unreasonable for this patient to be served in a less intensive setting? Yes 2. Co-Morbidities requiring supervision/potential complications: Atrial fibrillation, urosepsis, hypotension 3. Due to bladder management, bowel management, safety, skin/wound care, disease management, medication administration, pain management and patient education, does the patient require 24 hr/day rehab nursing? Yes 4. Does the patient require coordinated care of a physician, rehab nurse, PT (1-2 hrs/day, 5 days/week), OT  (1-2 hrs/day, 5 days/week) and SLP (.5-1 hrs/day, 5 days/week) to address physical and functional deficits in the context of the above medical diagnosis(es)? Yes Addressing deficits in the following areas: balance, endurance, locomotion, strength, transferring, bowel/bladder control, bathing, dressing, feeding, grooming,  toileting, cognition and psychosocial support 5. Can the patient actively participate in an intensive therapy program of at least 3 hrs of therapy 5 days a week? Yes 6. The potential for patient to make measurable gains while on inpatient rehab is good 7. Anticipated functional outcomes upon discharge from inpatients are: modified independent and supervision PT, modified independent and supervision OT, modified independent and supervision SLP 8. Estimated rehab length of stay to reach the above functional goals is: 7-10d 9. Does the patient have adequate social supports to accommodate these discharge functional goals? Yes and Potentially 10. Anticipated D/C setting: Home 11. Anticipated post D/C treatments: [place] therapy 12. Overall Rehab/Functional Prognosis: good    RECOMMENDATIONS: This patient's condition is appropriate for continued rehabilitative care in the following setting: CIR Patient has agreed to participate in recommended program. Yes Note that insurance prior authorization may be required for reimbursement for recommended care.  Comment:  Gunnar Fusi 02/15/2017

## 2017-02-15 NOTE — Progress Notes (Signed)
Inpatient Rehabilitation  Met with patient to discuss team's recommendation for IP Rehab.  Shared booklets and answered questions.  I have also called patient's daughter, Sharyn Lull to discuss our program.  She plans to discuss it with with her siblings and dad.  Plan to follow for family's decision.  Updated Hassan Rowan RN CM.  Please call with questions.   Carmelia Roller., CCC/SLP Admission Coordinator  [place]  Cell [phone]

## 2017-02-15 NOTE — Progress Notes (Signed)
Subjective: Patient awake and alert.  No new neurological complaints.    Objective: Current vital signs: BP (!) 160/75 (BP Location: Right Arm)   Pulse (!) 59   Temp 98 F (36.7 C) (Oral)   Resp 20   Ht 5\' 11"  (1.803 m)   Wt 78.9 kg (174 lb)   SpO2 95%   BMI 24.27 kg/m  Vital signs in last 24 hours: Temp:  [97.9 F (36.6 C)-98.7 F (37.1 C)] 98 F (36.7 C) (07/12 1208) Pulse Rate:  [59-67] 59 (07/12 1208) Resp:  [16-20] 20 (07/12 1208) BP: (125-160)/(49-75) 160/75 (07/12 1208) SpO2:  [95 %-100 %] 95 % (07/12 1208)  Intake/Output from previous day: 07/11 0701 - 07/12 0700 In: 290 [P.O.:240; IV Piggyback:50] Out: 400 [Urine:400] Intake/Output this shift: Total I/O In: 240 [P.O.:240] Out: 775 [Urine:775] Nutritional status: Diet Heart Room service appropriate? Yes; Fluid consistency: Thin  Neurologic Exam: Mental Status: Alert, oriented, thought content appropriate.  Speech fluent without evidence of aphasia.  Able to follow 3 step commands without some reinforcement. Cranial Nerves: II: Discs flat bilaterally; Left partial field cut, pupils equal, round, reactive to light and accommodation III,IV, VI: ptosis not present, extra-ocular motions intact bilaterally V,VII: smile symmetric, facial light touch sensation normal bilaterally VIII: hearing normal bilaterally IX,X: gag reflex present XI: bilateral shoulder shrug XII: midline tongue extension Motor: Right :  Upper extremity   5/5                                      Left:     Upper extremity   5/5             Lower extremity   5/5                                                  Lower extremity   5/5 Tone and bulk:normal tone throughout; no atrophy noted Sensory: Pinprick and light touch intact throughout, bilaterally Gait: Ambulates with walker taking small steps  Lab Results: Basic Metabolic Panel:  Recent Labs Lab 02/13/17 0908 02/14/17 0547  NA 138 135  K 4.2 3.6  CL 105 104  CO2 27 25  GLUCOSE  124* 112*  BUN 14 17  CREATININE 0.92 1.04  CALCIUM 8.9 8.5*    Liver Function Tests:  Recent Labs Lab 02/13/17 0908  AST 28  ALT 28  ALKPHOS 79  BILITOT 0.7  PROT 7.1  ALBUMIN 3.8   No results for input(s): LIPASE, AMYLASE in the last 168 hours. No results for input(s): AMMONIA in the last 168 hours.  CBC:  Recent Labs Lab 02/13/17 0908 02/14/17 0547  WBC 13.0* 20.1*  NEUTROABS 10.9*  --   HGB 13.1 12.1*  HCT 38.5* 36.3*  MCV 86.1 86.3  PLT 246 220    Cardiac Enzymes:  Recent Labs Lab 02/13/17 0908  TROPONINI <0.03    Lipid Panel: No results for input(s): CHOL, TRIG, HDL, CHOLHDL, VLDL, LDLCALC in the last 168 hours.  CBG: No results for input(s): GLUCAP in the last 168 hours.  Microbiology: Results for orders placed or performed during the hospital encounter of 02/13/17  Urine Culture     Status: Abnormal   Collection Time: 02/13/17 10:57 AM  Result Value Ref Range Status  Specimen Description URINE, RANDOM  Final   Special Requests NONE  Final   Culture >=100,000 COLONIES/mL ESCHERICHIA COLI (A)  Final   Report Status 02/15/2017 FINAL  Final   Organism ID, Bacteria ESCHERICHIA COLI (A)  Final      Susceptibility   Escherichia coli - MIC*    AMPICILLIN >=32 RESISTANT Resistant     CEFAZOLIN <=4 SENSITIVE Sensitive     CEFTRIAXONE <=1 SENSITIVE Sensitive     CIPROFLOXACIN <=0.25 SENSITIVE Sensitive     GENTAMICIN <=1 SENSITIVE Sensitive     IMIPENEM <=0.25 SENSITIVE Sensitive     NITROFURANTOIN 32 SENSITIVE Sensitive     TRIMETH/SULFA <=20 SENSITIVE Sensitive     AMPICILLIN/SULBACTAM 8 SENSITIVE Sensitive     PIP/TAZO 8 SENSITIVE Sensitive     Extended ESBL NEGATIVE Sensitive     * >=100,000 COLONIES/mL ESCHERICHIA COLI  CULTURE, BLOOD (ROUTINE X 2) w Reflex to ID Panel     Status: None (Preliminary result)   Collection Time: 02/13/17  4:27 PM  Result Value Ref Range Status   Specimen Description BLOOD RIGHT HAND  Final   Special Requests    Final    BOTTLES DRAWN AEROBIC AND ANAEROBIC Blood Culture adequate volume   Culture NO GROWTH 2 DAYS  Final   Report Status PENDING  Incomplete  CULTURE, BLOOD (ROUTINE X 2) w Reflex to ID Panel     Status: None (Preliminary result)   Collection Time: 02/13/17  4:27 PM  Result Value Ref Range Status   Specimen Description BLOOD LEFT ASSIST CONTROL  Final   Special Requests   Final    BOTTLES DRAWN AEROBIC AND ANAEROBIC Blood Culture adequate volume   Culture NO GROWTH 2 DAYS  Final   Report Status PENDING  Incomplete    Coagulation Studies:  Recent Labs  02/13/17 0908  LABPROT 13.6  INR 1.04    Imaging: Mr Brain Wo Contrast  Result Date: 02/13/2017 CLINICAL DATA:  Altered mental status, possible syncopal episode on toilet. History of stroke, atrial fibrillation on anticoagulation. EXAM: MRI HEAD WITHOUT CONTRAST TECHNIQUE: Multiplanar, multiecho pulse sequences of the brain and surrounding structures were obtained without intravenous contrast. COMPARISON:  CT HEAD February 13, 2017 at 0919 hours an MRI head July 08, 2012 FINDINGS: Mild motion degraded examination. BRAIN: Patchy reduced diffusion RIGHT temporal occipital lobe and to lesser extent RIGHT parietal lobe with low ADC values. Numerous foci of susceptibility artifact and predominately peripheral distribution. Patchy LEFT subarachnoid susceptibility artifact. Minimal peripheral LEFT temporal encephalomalacia. Similar LEFT frontotemporal lobe encephalomalacia. Moderate ventriculomegaly on the basis of global parenchymal brain volume loss. Patchy supratentorial white matter T2 hyperintensities exclusive of the aforementioned abnormality. Old LEFT basal ganglia hemorrhagic infarct. Old pontine lacunar infarcts. VASCULAR: Normal major intracranial vascular flow voids present at skull base. SKULL AND UPPER CERVICAL SPINE: No abnormal sellar expansion. No suspicious calvarial bone marrow signal. Craniocervical junction maintained.  SINUSES/ORBITS: Mild paranasal sinus mucosal thickening. LEFT mastoid effusion. The included ocular globes and orbital contents are non-suspicious. OTHER: None. IMPRESSION: 1. Acute small scattered RIGHT MCA and posterior watershed territory infarcts. 2. Old LEFT MCA infarcts, old LEFT basal ganglia hemorrhagic infarct. 3. Extensive peripheral chronic micro hemorrhages and artifact and patchy LEFT superficial siderosis, findings consistent with amyloid angiopathy. 4. Moderate chronic small vessel ischemic disease. Electronically Signed   By: Awilda Metroourtnay  Bloomer M.D.   On: 02/13/2017 20:21    Medications:  I have reviewed the patient's current medications. Scheduled: . apixaban  5 mg Oral  BID  . aspirin EC  81 mg Oral Daily  . atorvastatin  80 mg Oral Daily  . cholecalciferol  1,000 Units Oral Daily  . ferrous sulfate  325 mg Oral Q breakfast  . finasteride  5 mg Oral Daily  . pantoprazole  40 mg Oral Daily  . QUEtiapine  12.5 mg Oral QHS  . sertraline  25 mg Oral Daily    Assessment/Plan: Patient stable.  Remains on ASA and Eliquis.  Echocardiogram pending. 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  Area of left carotid endarterectomy is patent.  EEG unremarkable.    Recommendations: 1.  Agree with continued ASA and Eliquis   LOS: 2 days   Thana Farr, MD Neurology [phone] 02/15/2017  12:57 PM

## 2017-02-15 NOTE — Progress Notes (Signed)
OT Cancellation Note  Patient Details Name: Mickie BailGeorge Thomas Guandique MRN: [id number] DOB: [birth year]   Cancelled Treatment:    Reason Eval/Treat Not Completed: Other (comment). [facility]. Will hold OT treatment at this time as pt is not available.  Richrd PrimeJamie Stiller, MPH, MS, OTR/L ascom [phone] 02/15/17, 3:33 PM

## 2017-02-15 NOTE — Progress Notes (Signed)
Inpatient Rehabilitation  I have received medical clearance to admit patient to IP Rehab and patient/family are in agreement.  Have a bed to offer and will proceed with admitting patient later today.  Please call with questions.    Charlane FerrettiMelissa Sharnelle Cappelli, M.A., CCC/SLP Admission Coordinator  [facility]  Cell [phone]

## 2017-02-15 NOTE — Progress Notes (Signed)
Pt is being discharged today. All belongings packed and returned to the patient. IV left in for transport via [facility]. [facility] as well as [facility].
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
## 2017-02-16 ENCOUNTER — Inpatient Hospital Stay (HOSPITAL_COMMUNITY): Payer: Medicare Other | Admitting: Physical Therapy

## 2017-02-16 ENCOUNTER — Inpatient Hospital Stay (HOSPITAL_COMMUNITY): Payer: Medicare Other

## 2017-02-16 ENCOUNTER — Inpatient Hospital Stay (HOSPITAL_COMMUNITY): Payer: Medicare Other | Admitting: Occupational Therapy

## 2017-02-16 ENCOUNTER — Inpatient Hospital Stay (HOSPITAL_COMMUNITY): Payer: Medicare Other | Admitting: Speech Pathology

## 2017-02-16 DIAGNOSIS — G8194 Hemiplegia, unspecified affecting left nondominant side: Secondary | ICD-10-CM

## 2017-02-16 DIAGNOSIS — R414 Neurologic neglect syndrome: Secondary | ICD-10-CM

## 2017-02-16 DIAGNOSIS — I63511 Cerebral infarction due to unspecified occlusion or stenosis of right middle cerebral artery: Secondary | ICD-10-CM

## 2017-02-16 LAB — CBC WITH DIFFERENTIAL/PLATELET
BASOS PCT: 0 %
Basophils Absolute: 0 10*3/uL (ref 0.0–0.1)
EOS ABS: 0.2 10*3/uL (ref 0.0–0.7)
Eosinophils Relative: 1 %
HEMATOCRIT: 38 % — AB (ref 39.0–52.0)
HEMOGLOBIN: 12.3 g/dL — AB (ref 13.0–17.0)
Lymphocytes Relative: 11 %
Lymphs Abs: 1.3 10*3/uL (ref 0.7–4.0)
MCH: 28.5 pg (ref 26.0–34.0)
MCHC: 32.4 g/dL (ref 30.0–36.0)
MCV: 88.2 fL (ref 78.0–100.0)
Monocytes Absolute: 1.1 10*3/uL — ABNORMAL HIGH (ref 0.1–1.0)
Monocytes Relative: 9 %
NEUTROS ABS: 9 10*3/uL — AB (ref 1.7–7.7)
NEUTROS PCT: 79 %
Platelets: 267 10*3/uL (ref 150–400)
RBC: 4.31 MIL/uL (ref 4.22–5.81)
RDW: 16.3 % — ABNORMAL HIGH (ref 11.5–15.5)
WBC: 11.5 10*3/uL — AB (ref 4.0–10.5)

## 2017-02-16 LAB — COMPREHENSIVE METABOLIC PANEL
ALBUMIN: 3.1 g/dL — AB (ref 3.5–5.0)
ALK PHOS: 105 U/L (ref 38–126)
ALT: 50 U/L (ref 17–63)
ANION GAP: 8 (ref 5–15)
AST: 39 U/L (ref 15–41)
BUN: 11 mg/dL (ref 6–20)
CALCIUM: 8.8 mg/dL — AB (ref 8.9–10.3)
CO2: 24 mmol/L (ref 22–32)
CREATININE: 0.77 mg/dL (ref 0.61–1.24)
Chloride: 103 mmol/L (ref 101–111)
GFR calc Af Amer: >60 mL/min (ref >60)
GFR calc non Af Amer: >60 mL/min (ref >60)
GLUCOSE: 127 mg/dL — AB (ref 65–99)
Potassium: 3.2 mmol/L — ABNORMAL LOW (ref 3.5–5.1)
SODIUM: 135 mmol/L (ref 135–145)
Total Bilirubin: 0.8 mg/dL (ref 0.3–1.2)
Total Protein: 6.8 g/dL (ref 6.5–8.1)

## 2017-02-16 NOTE — Progress Notes (Signed)
Patient information reviewed and entered into eRehab system by Maximiliano Cromartie, RN, CRRN, PPS Coordinator.  Information including medical coding and functional independence measure will be reviewed and updated through discharge.     Per nursing patient was given "Data Collection Information Summary for Patients in Inpatient Rehabilitation Facilities with attached "Privacy Act Statement-Health Care Records" upon admission.  

## 2017-02-16 NOTE — Evaluation (Signed)
Physical Therapy Assessment and Plan  Patient Details  Name: [name] MRN: [id number] Date of Birth: Oct 12, 1933  PT Diagnosis: Abnormality of gait, Hemiplegia non-dominant, Impaired cognition, Impaired sensation and Muscle weakness Rehab Potential: Good ELOS: 15-19    Today's Date: 02/16/2017 PT Individual Time: 7124-5809 PT Individual Time Calculation (min): 72 min    Problem List:  Patient Active Problem List   Diagnosis Date Noted  . CVA (cerebral vascular accident) ([place]) 02/15/2017  . Acute cerebral infarction ([place])   . Encephalopathy 02/13/2017  . Tachycardia 04/20/2016    Past Medical History:  Past Medical History:  Diagnosis Date  . A-fib ([place])   . GERD (gastroesophageal reflux disease)   . Hypercholesteremia   . Stroke [facility])    Past Surgical History:  Past Surgical History:  Procedure Laterality Date  . TOTAL HIP ARTHROPLASTY Left     Assessment & Plan Clinical Impression: Patient is a 83 year old right-handed male with history of CVA no residual, history of atrial fibrillation on Eliquis. Per chart review patient lives alone independently with assistive device prior to admission. One level home 3 steps to entry. Presented 02/13/2017 with altered mental status as well as chills and a fall while going to the bathroom. Cranial CT scan negative for acute changes. There was a right frontal scalp hematoma. CT angiogram head and neck showed no emergent large vessel occlusion. CT cervical spine negative. MRI of the brain showed acute small scattered right MCA and posterior watershed territory infarct. Patient did not receive TPA. Echocardiogram pending. EEG showed no seizure activity. Patient remains on Eliquisfor CVA prophylaxis.  Patient transferred to CIR on 02/15/2017 .   Patient currently requires mod with mobility secondary to muscle weakness, decreased cardiorespiratoy endurance, unbalanced muscle activation, decreased coordination and decreased motor  planning, decreased attention, decreased awareness, decreased problem solving, decreased safety awareness, decreased memory and delayed processing and decreased standing balance, decreased postural control, hemiplegia and decreased balance strategies.  Prior to hospitalization, patient was modified independent  with mobility and lived with Alone in a House home.  Home access is 3 to enter with hand rails on both sidesStairs to enter.  Patient will benefit from skilled PT intervention to maximize safe functional mobility, minimize fall risk and decrease caregiver burden for planned discharge home with 24 hour supervision.  Anticipate patient will benefit from follow up [place] at discharge.  PT - End of Session Activity Tolerance: Tolerates 10 - 20 min activity with multiple rests Endurance Deficit: Yes PT Assessment Rehab Potential (ACUTE/IP ONLY): Good PT Barriers to Discharge: Inaccessible home environment;Decreased caregiver support;Home environment access/layout PT Patient demonstrates impairments in the following area(s): Balance;Behavior;Edema;Endurance;Motor;Nutrition;Pain;Perception;Safety;Sensory;Skin Integrity PT Transfers Functional Problem(s): Bed Mobility;Bed to Chair;Car;Floor;Furniture PT Locomotion Functional Problem(s): Ambulation;Wheelchair Mobility;Stairs PT Plan PT Intensity: Minimum of 1-2 x/day ,45 to 90 minutes PT Frequency: 5 out of 7 days PT Duration Estimated Length of Stay: 15-19  PT Treatment/Interventions: Balance/vestibular training;Cognitive remediation/compensation;Community reintegration;Disease management/prevention;Discharge planning;Functional electrical stimulation;DME/adaptive equipment instruction;Functional mobility training;Neuromuscular re-education;Pain management;Psychosocial support;Patient/family education;Skin care/wound management;Splinting/orthotics;Stair training;Therapeutic Activities;Therapeutic Exercise;UE/LE Strength taining/ROM;UE/LE Coordination  activities;Visual/perceptual remediation/compensation;Wheelchair propulsion/positioning PT Transfers Anticipated Outcome(s): supervision assist with LRAD  PT Locomotion Anticipated Outcome(s): Supervision assist with LRAD  PT Recommendation Recommendations for Other Services: Neuropsych consult Follow Up Recommendations: Home health PT Patient destination: Home Equipment Recommended: Wheelchair cushion (measurements);Wheelchair (measurements);Rolling walker with 5" wheels  Skilled Therapeutic Intervention PT instructed patient in PT Evaluation and initiated treatment intervention; see below for results. PT educated patient in [place], rehab potential, rehab goals, and discharge  recommendations. Pt instructed in additional transfer training and gait training with RW, completed x 121f with supervision assist from PT. Pt noted to have irregular stepping pattern on the LLE with mild dysmetria. Pt returned to room and left sitting in Recliner with call bell in reach.    PT Evaluation Precautions/Restrictions Precautions Precautions: Fall Restrictions Weight Bearing Restrictions: No General   Vital Signs Pain   Faces: slight pain .  Home Living/Prior Functioning Home Living Available Help at Discharge: Family Type of Home: House Home Access: Stairs to enter [employer] Steps: 3 to enter with hand rails on both sides Entrance Stairs-Rails: Right;Left;Can reach both Home Layout: One level;Other (Comment) Bathroom Shower/Tub: Tub/shower unit  Lives With: Alone Prior Function Level of Independence: Requires assistive device for independence  Able to Take Stairs?: Yes Driving: Yes Vocation: Retired Comments: Enjoys riding motor cycle when able. Pt's family that he will d/c to one of their houses with either 2 STE or 56ST with Bilateral rails.  Vision/Perception  Vision - Assessment Alignment/Gaze Preference: Gaze right Tracking/Visual Pursuits: Decreased smoothness of eye  movement to LEFT superior field;Decreased smoothness of eye movement to LEFT inferior field Additional Comments: pt kept eyes closed alot of the session  Perception Perception: Impaired Inattention/Neglect: Does not attend to left visual field Praxis Praxis: Impaired Praxis Impairment Details: Motor planning  Cognition Overall Cognitive Status: Impaired/Different from baseline Arousal/Alertness: Awake/alert Attention: Sustained Sustained Attention: Appears intact Selective Attention: Impaired Selective Attention Impairment: Functional basic Memory: Impaired Awareness: Impaired Awareness Impairment: Intellectual impairment;Emergent impairment Problem Solving: Impaired Executive Function:  (all impaired) Safety/Judgment: Impaired Sensation Sensation Light Touch: Impaired by gross assessment Hot/Cold: Appears Intact Proprioception: Impaired by gross assessment Additional Comments: decreased appreciation of light tough in the LLE Coordination Gross Motor Movements are Fluid and Coordinated: No Fine Motor Movements are Fluid and Coordinated: No Coordination and Movement Description: Mild dysmetria in the LLE.  Motor  Motor Motor: Motor apraxia;Abnormal postural alignment and control;Hemiplegia Motor - Skilled Clinical Observations: acute pain  Mobility Bed Mobility Bed Mobility: Rolling Right;Rolling Left;Sit to Supine;Supine to Sit Rolling Right: 4: Min assist Rolling Right Details: Tactile cues for sequencing;Verbal cues for technique;Visual cues/gestures for sequencing;Verbal cues for precautions/safety Rolling Left: 4: Min assist Rolling Left Details: Tactile cues for sequencing;Verbal cues for technique;Verbal cues for precautions/safety;Visual cues/gestures for sequencing Supine to Sit: 5: Supervision Supine to Sit Details: Verbal cues for technique;Verbal cues for sequencing Sit to Supine: 5: Supervision Sit to Supine - Details: Verbal cues for technique;Verbal cues for  sequencing Transfers Transfers: Yes (without AD ) Sit to Stand: 3: Mod assist Sit to Stand Details: Verbal cues for technique;Verbal cues for precautions/safety Stand to Sit: 3: Mod assist Stand to Sit Details (indicate cue type and reason): Verbal cues for technique;Verbal cues for precautions/safety;Tactile cues for placement Stand Pivot Transfers: 3: Mod assist Stand Pivot Transfer Details: Verbal cues for technique;Manual facilitation for weight shifting;Verbal cues for precautions/safety;Verbal cues for gait pattern Locomotion  Ambulation Ambulation: Yes Ambulation/Gait Assistance: 4: Min assist Ambulation Distance (Feet): 120 Feet Assistive device: Rolling walker Gait Gait: Yes Gait Pattern: Impaired Gait Pattern: Decreased step length - left;Right flexed knee in stance;Ataxic Stairs / Additional Locomotion Stairs: Yes Stairs Assistance: 3: Mod assist Stairs Assistance Details: Verbal cues for sequencing;Verbal cues for technique;Verbal cues for precautions/safety;Manual facilitation for weight shifting;Manual facilitation for placement;Visual cues/gestures for precautions/safety;Visual cues for safe use of DME/AE Stair Management Technique: Two rails Number of Stairs: 4 Height of Stairs: 6 Wheelchair Mobility Wheelchair Mobility: Yes  Wheelchair Assistance: 4: Advertising account executive Details: Visual cues/gestures for precautions/safety;Visual cues/gestures for sequencing;Verbal cues for technique;Verbal cues for precautions/safety Wheelchair Propulsion: Both upper extremities Wheelchair Parts Management: Needs assistance Distance: 100  Trunk/Postural Assessment  Cervical Assessment Cervical Assessment:  (rigid neck due to pain) Thoracic Assessment Thoracic Assessment: Within Functional Limits Lumbar Assessment Lumbar Assessment:  (posterior pelvic ilt) Postural Control Postural Control: Deficits on evaluation Righting Reactions: delayed and posterior LOB   Balance Balance Balance Assessed: Yes Dynamic Sitting Balance Dynamic Sitting - Level of Assistance: 4: Min assist Static Standing Balance Static Standing - Balance Support: During functional activity Static Standing - Level of Assistance: 3: Mod assist Dynamic Standing Balance Dynamic Standing - Balance Support: During functional activity Dynamic Standing - Level of Assistance: 3: Mod assist Extremity Assessment  RUE Assessment RUE Assessment: Within Functional Limits LUE Assessment LUE Assessment: Within Functional Limits RLE Assessment RLE Assessment: Within Functional Limits (grossly 4/5 proximal to distal) LLE Assessment LLE Assessment: Within Functional Limits (Grossly 4/5 with mild decreased coordination. )   See Function Navigator for Current Functional Status.   Refer to Care Plan for Long Term Goals  Recommendations for other services: Neuropsych  Discharge Criteria: Patient will be discharged from PT if patient refuses treatment 3 consecutive times without medical reason, if treatment goals not met, if there is a change in medical status, if patient makes no progress towards goals or if patient is discharged from hospital.  The above assessment, treatment plan, treatment alternatives and goals were discussed and mutually agreed upon: by patient and by family  Lorie Phenix 02/16/2017, 2:19 PM

## 2017-02-16 NOTE — Evaluation (Signed)
Speech Language Pathology Assessment and Plan  Patient Details  Name: [name] MRN: [id number] Date of Birth: 04-22-1934  SLP Diagnosis: Cognitive Impairments  Rehab Potential: Fair ELOS: 21 days     Today's Date: 02/16/2017 SLP Individual Time: 4332-9518 SLP Individual Time Calculation (min): 57 min   Problem List:  Patient Active Problem List   Diagnosis Date Noted  . CVA (cerebral vascular accident) ([place]) 02/15/2017  . Acute cerebral infarction ([place])   . Encephalopathy 02/13/2017  . Tachycardia 04/20/2016   Past Medical History:  Past Medical History:  Diagnosis Date  . A-fib ([place])   . GERD (gastroesophageal reflux disease)   . Hypercholesteremia   . Stroke [facility])    Past Surgical History:  Past Surgical History:  Procedure Laterality Date  . TOTAL HIP ARTHROPLASTY Left     Assessment / Plan / Recommendation Clinical Impression   83 year old right-handed male with history of CVA no residual, history of atrial fibrillation on Eliquis. Per chart review patient lives alone independently with assistive device prior to admission. Presented 02/13/2017 with altered mental status as well as chills and a fall while going to the bathroom. Cranial CT scan negative for acute changes. There was a right frontal scalp hematoma. MRI of the brain showed acute small scattered right MCA and posterior watershed territory infarct. Urine culture 100,000 Escherichia coli maintained on Rocephin. Physical and occupational therapy evaluations completed patient was admitted for comprehensive rehabilitation program. SLP evaluation was completed on 02/16/2017 with the following results: Pt presents with moderately severe cognitive impairments.  He is oriented to self, place, and date but is disoriented to situation.  Pt demonstrated decreased retrieval of information and decreased recall of new information, decreased sustained attention to tasks, decreased emergent awareness of deficits,  decreased safety awareness with impulsivity and restlessness, and decreased functional problem solving.  As a result, pt would benefit from skilled ST while inpatient in order to maximize functional independence and reduce burden of care prior to discharge.  Anticipate that pt will need 24/7 supervision at discharge in addition to [place] follow up at next level of care.    Skilled Therapeutic Interventions          Cognitive-linguistic evaluation completed with results and recommendations reviewed with patient and family.  Pt's son Konrad Dolores was present during today's evaluation and helped verify information regarding pt's baseline for cognition.  Pt was managing his medications prior to admission but pt's family had begun noticing changes in mentation since this past fall.  Pt needed max assist for task organization and working memory when counting money and making changes.  Pt frequently internally and externally distracted and needed mod assist verbal cues for redirection to task.  He was able to recall 2 out of 3 steps a verbal directive after and ~5 minute delay with mod assist which improved to 3 out of 3 recall with max assist/choice of three.  Pt also needed choice of three and max assist verbal cues to recall orientation information in regards to orientation to situation.  Pt and pt's son's questions were answered to their satisfaction at this time regarding goals and plan of care.  PT left in recliner with son at bedside.      SLP Assessment  Patient will need skilled [place] [facility] during CIR admission    Recommendations  Patient destination: Home Follow up Recommendations: Home Health SLP;Outpatient SLP;Skilled Nursing facility;24 hour supervision/assistance Equipment Recommended: None recommended by SLP    SLP Frequency 3 to  5 out of 7 days   SLP Duration  SLP Intensity  SLP Treatment/Interventions 21 days   Minumum of 1-2 x/day, 30 to 90 minutes  Cognitive  remediation/compensation;Cueing hierarchy;Environmental controls;Internal/external aids;Patient/family education    Pain Pain Assessment Pain Assessment: 0-10 Pain Score: 10-Worst pain ever Pain Type: Acute pain Pain Location: Neck Pain Descriptors / Indicators: Aching Pain Intervention(s): RN made aware  Prior Functioning Cognitive/Linguistic Baseline: Baseline deficits Baseline deficit details: baseline memory impairment Type of Home: House  Lives With: Alone Available Help at Discharge: Family;Available PRN/intermittently Education: high school Vocation: Retired  Function:  Eating Eating                 Cognition Comprehension Comprehension assist level: Understands basic 75 - 89% of the time/ requires cueing 10 - 24% of the time  Expression   Expression assist level: Expresses basic 50 - 74% of the time/requires cueing 25 - 49% of the time. Needs to repeat parts of sentences.  Social Interaction Social Interaction assist level: Interacts appropriately 50 - 74% of the time - May be physically or verbally inappropriate.  Problem Solving Problem solving assist level: Solves basic 25 - 49% of the time - needs direction more than half the time to initiate, plan or complete simple activities  Memory Memory assist level: Recognizes or recalls 25 - 49% of the time/requires cueing 50 - 75% of the time   Short Term Goals: Week 1: SLP Short Term Goal 1 (Week 1): Pt will utilize external aids to recall basic, daily information with mod assist multimodal cues.   SLP Short Term Goal 2 (Week 1): Pt will complete basic, familiar tasks with mod assist multimodal cues for functional problem solving.   SLP Short Term Goal 3 (Week 1): Pt will recognize and correct errors during basic, familiar tasks with mod assist multimodal cues.   SLP Short Term Goal 4 (Week 1): Pt will sustain his attention to basic tasks for 5-7 minute intervals with min assist verbal cues for redirection to task.    SLP Short Term Goal 5 (Week 1): Pt will return demonstration of safety precautions with mod assist multimodal cues.    Refer to Care Plan for Long Term Goals  Recommendations for other services: None   Discharge Criteria: Patient will be discharged from SLP if patient refuses treatment 3 consecutive times without medical reason, if treatment goals not met, if there is a change in medical status, if patient makes no progress towards goals or if patient is discharged from hospital.  The above assessment, treatment plan, treatment alternatives and goals were discussed and mutually agreed upon: by patient and by family  Emilio Math 02/16/2017, 4:39 PM

## 2017-02-16 NOTE — Progress Notes (Signed)
Patient still complaining of pain to neck after ice to site; Patient increasingly agitated and made attempts to exit bed; PRN tramadol administered; on-call PA contacted and RN instructed to order xray for neck. Will continue to monitor  Harlow AsaSochima O Araseli Sherry, RN

## 2017-02-16 NOTE — Progress Notes (Signed)
Gregory Blake, MD Physician Signed Physical Medicine and Rehabilitation  PMR Pre-admission Date of Service: 02/15/2017 2:20 PM  Related encounter: ED to Hosp-Admission (Discharged) from 02/13/2017 in [place] (1C)       _0 Hide copied text   [place] [employer] PMR Admission Coordinator Pre-Admission Assessment  Patient: [name] is an 83 y.o., male MRN: [id number] DOB: 09/09/1933 Height: _1  (180.3 cm) Weight: 78.9 kg (174 lb)  Insurance Information HMO:     PPO:      PCP:      IPA:      80/20:      OTHER:  PRIMARY: Medicare A &B       Policy#: [id number]      Subscriber: Self  CM Name:       Phone#:      Fax#:  Pre-Cert#: eligible via [employer]      Employer: Retired  Benefits:  Phone #: verified online      Name:  Eff. Date: 06/07/01     Deduct: $1349      Out of Pocket Max: none      Life Max: none CIR: 100%      SNF: 100% days 1-20; 80% days 21-100 Outpatient: 80%     Co-Pay: 20% Home Health: 100%      Co-Pay: none DME: 80%     Co-Pay: 20% Providers: patient's choice   SECONDARY: Reports he has [facility] need to obtain info Policy#:       Subscriber:  CM Name:       Phone#:      Fax#:  Pre-Cert#:       Employer:  Benefits:  Phone #:      Name:  Eff. Date:      Deduct:       Out of Pocket Max:       Life Max:  CIR:       SNF:  Outpatient:      Co-Pay:  Home Health:       Co-Pay:  DME:      Co-Pay:   Medicaid Application Date:       Case Manager:  Disability Application Date:       Case Worker:   Emergency Contact Information        Contact Information    Name Relation Home Work [place] Daughter [phone]  [phone]   Pope,Michelle Daughter [phone]  [phone]      Current Medical History  Patient Admitting Diagnosis: Acute small scattered right MCA and posterior watershed territory infarcts  History of Present Illness: 83 year old right-handed male with history of CVA no  residual, history of atrial fibrillation on Eliquis. Per chart review patient lives alone independently with assistive device prior to admission. One level home 3 steps to entry. Presented 02/13/2017 with altered mental status as well as chills and a fall while going to the bathroom. Cranial CT scan negative for acute changes. There was a right frontal scalp hematoma. CT angiogram head and neck showed no emergent large vessel occlusion. CT cervical spine negative. MRI of the brain showed acute small scattered right MCA and posterior watershed territory infarct. Patient did not receive TPA. Echocardiogram pending. EEG showed no seizure activity. Patient remains on Eliquisfor CVA prophylaxis. Urine culture 100,000 Escherichia coli maintained on Rocephin. Physical and occupational therapy evaluations completed patient was admitted for comprehensive rehabilitation program.  Patient's medical record from [facility] has been reviewed by the rehabilitation admission coordinator and  physician. NIH Stroke scale: 3  Past Medical History      Past Medical History:  Diagnosis Date  . A-fib ([place])   . GERD (gastroesophageal reflux disease)   . Hypercholesteremia   . Stroke [facility])     Family History   family history includes Hypertension in his father.  Prior Rehab/Hospitalizations Has the patient had major surgery during 100 days prior to admission? No              Current Medications Refer to [facility] MAR  Patients Current Diet:  Heart Healthy with thin liquids   Precautions / Restrictions Precautions Precautions: Fall Restrictions Weight Bearing Restrictions: No   Has the patient had 2 or more falls or a fall with injury in the past year?No  Prior Activity Level Community (5-7x/wk): Prior to admission patient was fully independent, driving, and living alone.  Prior Functional Level Self Care: Did the patient need help bathing, dressing, using the toilet or eating?  Independent  Indoor Mobility: Did the patient need assistance with walking from room to room (with or without device)? Independent  Stairs: Did the patient need assistance with internal or external stairs (with or without device)? Independent  Functional Cognition: Did the patient need help planning regular tasks such as shopping or remembering to take medications? Needed some help  Home Assistive Devices / [place] Devices/Equipment: Environmental consultant (specify type), Cane (specify quad or straight), Raised toilet seat with rails Home Equipment: Cane - single point, Walker - 2 wheels, Bedside commode, Walker - 4 wheels  Prior Device Use: Indicate devices/aids used by the patient prior to current illness, exacerbation or injury? Walker and Single point cane    Prior Functional Level Current Functional Level  Bed Mobility    Independent     Min A   Transfers    Independent     Min A   Mobility - Walk/Wheelchair    Mod I     Min A   Upper Body Dressing    Independent     Min A   Lower Body Dressing    Independent     Min A-Mod A   Grooming    Independent     Supervision    Eating/Drinking    Independent     Supervision for left inattention    Programmer, systems     Min A   Bladder Continence     Yes    Intermittent continence    Bowel Management    Continent     Continent    Stair Climbing    Mod I-Independent     Not yet assessed    Communication    Independent     Independent    Memory    Mod I     TBA appeared functional upon interview    Cooking/Meal Prep    Independent       Housework    Independent     Money Management    Some assist from kids with medical decisions     Driving    Yes      Special needs/care consideration BiPAP/CPAP: Yes he reports using intermittently  CPM: No Continuous Drip IV: No Dialysis: No         Life Vest: No Oxygen:  No Special Bed: No Trach Size: No Wound Vac (area): No Skin: Bruising and abrasions to right head, eye, face  Bowel mgmt: 7/11 continent  Bladder mgmt: Incontinent  Diabetic mgmt: No  Previous Home Environment Living Arrangements: Alone  Lives With: Alone Available Help at Discharge: Family Type of Home: House Home Layout: One level, Other (Comment) Home Access: Stairs to enter Entrance Stairs-Rails: Right, Left, Can reach both Entrance Stairs-Number of Steps: 3 to enter with hand rails on both sides Bathroom Shower/Tub: Chiropodist: Handicapped height Bathroom Accessibility: Yes How Accessible: Accessible via walker [place]: Yes Type of Home Care Services: Home PT (with the [place]) [place] (if known): [facility] Additional Comments: Patient uses BSC over toilet at home  Discharge Living Setting Plans for Discharge Living Setting: Alone Type of Home at Discharge: House Discharge Home Layout: One level, Other (Comment) (with 3 steps down into the kitchen ) Discharge Home Access: Stairs to enter Entrance Stairs-Rails: Can reach both Entrance Stairs-Number of Steps: 3 Discharge Bathroom Shower/Tub: Walk-in shower Discharge Bathroom Toilet: Standard Discharge Bathroom Accessibility: Yes How Accessible: Accessible via walker Does the patient have any problems obtaining your medications?: No  Social/Family/Support Systems Patient Roles: Partner, Other (Comment) (grandparent, sibling, friend ) Sport and exercise psychologist Information: Daughter: Silverio Lay [phone] Anticipated Caregiver: Family to coordinate what is advised by team  Anticipated Caregiver's Contact Information: see above Ability/Limitations of Caregiver: they all work full or part-time Caregiver Availability: Other (Comment) (24/7 woud be hard but they are committed to caring for dad) Discharge Plan Discussed with Primary Caregiver: Yes Is Caregiver In Agreement  with Plan?: Yes Does Caregiver/Family have Issues with Lodging/Transportation while Pt is in Rehab?: No  Goals/Additional Needs Patient/Family Goal for Rehab: PT/OT/SLP Mod I-Supervision  Expected length of stay: ~14 days  Cultural Considerations: [place] Dietary Needs: Heart Healthy Restrictions Equipment Needs: TBD Special Service Needs: Has VA benefits for follow up and RX needs  Additional Information: None Pt/Family Agrees to Admission and willing to participate: Yes Program Orientation Provided & Reviewed with Pt/Caregiver Including Roles  & Responsibilities: Yes Additional Information Needs: Education with family for discharge recommendations  Information Needs to be Provided By: Team   Patient Condition: I have reviewed the patient's medical record, met with the patient at bedside, and spoken with family via phone.  Patient currently requiring Min assist with gait, Min-Mod with bathing and dressing, and has left inattention.   Given, that patient was fully independent and active prior to admission he makes an excellent IP Rehab candidate. Patient is eager to return home as independent as possible and given his new diagnosis of a CVA he would benefit from the coordinated medical and therapy care the IP Rehab team has to offer.  Patient requires skilled PT, OT, and SLP services and can actively participate in 3 hours of therapy 5/7 days of the week.    Preadmission Screen Completed By:  Gunnar Fusi, 02/15/2017 2:20 PM ______________________________________________________________________   Discussed status with Dr. Letta Pate on 02/15/17 at 1430 and received telephone approval for admission today.  Admission Coordinator:  Gunnar Fusi, time 1430/Date 02/15/17   Assessment/Plan: Diagnosis: 1. Does the need for close, 24 hr/day  Medical supervision in concert with the patient's rehab needs make it unreasonable for this patient to be served in a less intensive setting?  Yes 2. Co-Morbidities requiring supervision/potential complications: Atrial fibrillation, urosepsis, hypotension 3. Due to bladder management, bowel management, safety, skin/wound care, disease management, medication administration, pain management and patient education, does the patient require 24 hr/day rehab nursing? Yes 4. Does the patient require coordinated care of a physician, rehab nurse,  PT (1-2 hrs/day, 5 days/week), OT (1-2 hrs/day, 5 days/week) and SLP (.5-1 hrs/day, 5 days/week) to address physical and functional deficits in the context of the above medical diagnosis(es)? Yes Addressing deficits in the following areas: balance, endurance, locomotion, strength, transferring, bowel/bladder control, bathing, dressing, feeding, grooming, toileting, cognition and psychosocial support 5. Can the patient actively participate in an intensive therapy program of at least 3 hrs of therapy 5 days a week? Yes 6. The potential for patient to make measurable gains while on inpatient rehab is good 7. Anticipated functional outcomes upon discharge from inpatients are: modified independent and supervision PT, modified independent and supervision OT, modified independent and supervision SLP 8. Estimated rehab length of stay to reach the above functional goals is: 7-10d 9. Does the patient have adequate social supports to accommodate these discharge functional goals? Yes and Potentially 10. Anticipated D/C setting: Home 11. Anticipated post D/C treatments: [place] therapy 12. Overall Rehab/Functional Prognosis: good    RECOMMENDATIONS: This patient's condition is appropriate for continued rehabilitative care in the following setting: CIR Patient has agreed to participate in recommended program. Yes Note that insurance prior authorization may be required for reimbursement for recommended care.  Comment:  Gunnar Fusi 02/15/2017    Revision History

## 2017-02-16 NOTE — H&P (Signed)
Physical Medicine and Rehabilitation Admission H&P       Chief Complaint  Patient presents with  . Altered Mental Status  : HPI: 83 year old right-handed male with history of CVA no residual, history of atrial fibrillation on Eliquis. Per chart review patient lives alone independently with assistive device prior to admission. One level home 3 steps to entry. Presented 02/13/2017 with altered mental status as well as chills and a fall while going to the bathroom. Cranial CT scan negative for acute changes. There was a right frontal scalp hematoma. CT angiogram head and neck showed no emergent large vessel occlusion. CT cervical spine negative. MRI of the brain showed acute small scattered right MCA and posterior watershed territory infarct. Patient did not receive TPA. Echocardiogram pending. EEG showed no seizure activity. Patient remains on Eliquis for CVA prophylaxis. Urine culture 100,000 Escherichia coli maintained on Rocephin. Physical and occupational therapy evaluations completed patient was admitted for comprehensive rehabilitation program.  Review of Systems  Constitutional: Negative for chills and fever.  HENT: Negative for hearing loss.   Eyes: Negative for blurred vision and double vision.  Respiratory: Negative for cough and shortness of breath.   Cardiovascular: Positive for palpitations and leg swelling. Negative for chest pain.  Gastrointestinal: Positive for constipation. Negative for nausea and vomiting.       GERD  Genitourinary: Positive for urgency. Negative for dysuria and hematuria.  Musculoskeletal: Positive for falls, joint pain and myalgias.  Skin: Negative for rash.  Neurological: Positive for weakness. Negative for seizures.  Psychiatric/Behavioral: Positive for depression.  All other systems reviewed and are negative.      Past Medical History:  Diagnosis Date  . A-fib ([place])   . GERD (gastroesophageal reflux disease)   . Hypercholesteremia   .  Stroke [facility])         Past Surgical History:  Procedure Laterality Date  . TOTAL HIP ARTHROPLASTY Left         Family History  Problem Relation Age of Onset  . Hypertension Father    Social History:  reports that he has never smoked. He has never used smokeless tobacco. He reports that he does not drink alcohol or use drugs. Allergies: No Known Allergies       Medications Prior to Admission  Medication Sig Dispense Refill  . apixaban (ELIQUIS) 5 MG TABS tablet Take 5 mg by mouth 2 (two) times daily.    Marland Kitchen aspirin EC 81 MG tablet Take 81 mg by mouth daily.    Marland Kitchen atorvastatin (LIPITOR) 80 MG tablet Take 80 mg by mouth daily.     . Cholecalciferol 1000 units TBDP Take 1 tablet by mouth daily.    . ferrous sulfate 325 (65 FE) MG tablet Take 325 mg by mouth daily with breakfast.    . finasteride (PROSCAR) 5 MG tablet Take 5 mg by mouth daily.    Marland Kitchen omeprazole (PRILOSEC) 20 MG capsule Take 20 mg by mouth daily.    . sertraline (ZOLOFT) 25 MG tablet Take 25 mg by mouth daily.    . tamsulosin (FLOMAX) 0.4 MG CAPS capsule Take 0.4 mg by mouth.    . ciprofloxacin (CIPRO) 500 MG tablet Take 1 tablet (500 mg total) by mouth 2 (two) times daily. 14 tablet 0  . docusate sodium (COLACE) 100 MG capsule Take 100 mg by mouth daily as needed for mild constipation.      Home: Home Living Family/patient expects to be discharged to:: Private residence Living Arrangements: Alone  Available Help at Discharge: Family Type of Home: House Home Access: Stairs to enter [employer] of Steps: 3 to enter with hand rails on both sides Entrance Stairs-Rails: Right, Left, Can reach both Home Layout: One level, Other (Comment) (3 steps down to kitchen with B hand rails) Bathroom Shower/Tub: Chiropodist: Handicapped height Bathroom Accessibility: Yes Home Equipment: [place] - single point, Environmental consultant - 2 wheels, Bedside commode, Walker - 4 wheels Additional  Comments: Patient uses BSC over toilet at home   Functional History: Prior Function Level of Independence: Independent with assistive device(s) Comments: Pt ambulates with RW inside of home and SPC in community.  Family reports 2 falls in past 6 months (both times pt diagnosed with an acute stroke)  Functional Status:  Mobility: Bed Mobility Overal bed mobility: Needs Assistance Bed Mobility: Supine to Sit Supine to sit: Supervision, HOB elevated General bed mobility comments: SBA for safety sitting up on L side of bed Transfers Overall transfer level: Needs assistance Equipment used: Rolling walker (2 wheeled) Transfers: Sit to/from Stand Sit to Stand: Min guard, Min assist General transfer comment: x2 trials from bed and x2 trials from recliner; vc's for hand placement; pt leaning to L when sitting down Ambulation/Gait Ambulation/Gait assistance: Min guard, Min assist Ambulation Distance (Feet): 80 Feet Assistive device: Rolling walker (2 wheeled) Gait Pattern/deviations: Step-through pattern General Gait Details: mild decreased stance time L LE; decreased B step length (L more decreased than R); decreased L heel strike; pt with difficulty  navigating past objects on L side d/t L visual impairements; pt standing more on L side of walker (not midline) and had difficulty staying within walker with turns (L side of body on outside of walker) Gait velocity: decreased  ADL: ADL Overall ADL's : Needs assistance/impaired Eating/Feeding: Set up, Minimal assistance Eating/Feeding Details (indicate cue type and reason): Patient demonstrating difficulty with self feeding, increased spillage, difficulty with finding items on plate towards the left side.   Grooming: Minimal assistance, Sitting Grooming Details (indicate cue type and reason): cues for items placed to the left Upper Body Bathing: Minimal assistance Lower Body Bathing: Moderate assistance Lower Body Bathing Details (indicate  cue type and reason): Patient had a hip replacement about 7 months ago but reports he has been able to reach down more to wash his legs in recent months.  Upper Body Dressing : Set up, Minimal assistance Lower Body Dressing: Set up, Minimal assistance Lower Body Dressing Details (indicate cue type and reason): min assist for standing to perform clothing negotiation. Toilet Transfer: Minimal assistance, Cueing for safety Toileting- Clothing Manipulation and Hygiene: Minimal assistance General ADL Comments: Patient with decreased balance in standing and needing to hold on to therapist.  He reports he was just about to graduate from the cane with PT prior to admission.     Cognition: Cognition Overall Cognitive Status: Within Functional Limits for tasks assessed Orientation Level: Oriented X4 Cognition Arousal/Alertness: Awake/alert Behavior During Therapy: Anxious Overall Cognitive Status: Within Functional Limits for tasks assessed General Comments: Pt's family reports pt has some memory deficits (baseline)  Physical Exam: Blood pressure (!) 160/75, pulse (!) 59, temperature 98 F (36.7 C), temperature source Oral, resp. rate 20, height 5' 11"  (1.803 m), weight 78.9 kg (174 lb), SpO2 95 %. Physical Exam  HENT:  Head: Normocephalic.  Eyes: Pupils are equal, round, and reactive to light. Conjunctivae and EOM are normal.  Neck: Normal range of motion. Neck supple. No tracheal deviation present. No thyromegaly present.  Cardiovascular: Normal rate and regular rhythm.   Cardiac rate control  Respiratory: Effort normal and breath sounds normal. No respiratory distress. He has no wheezes. He has no rales.  GI: Soft. Bowel sounds are normal. He exhibits no distension. There is no tenderness. There is no rebound.  Neurological: He is alert.  Speech is fluent. Provides name and age as well as follow simple commands. Oriented to place, month, year. Fair sitting balance although leans to left.  Mild left pronator drift. Motor 3+ to 4/5 UE's. LE: 4/5 prox to distal. Sensory function grossly intact to LT/pain  Skin: Skin is warm and dry.  Skin. Warm and dry  Lab Results Last 48 Hours        Results for orders placed or performed during the hospital encounter of 02/13/17 (from the past 48 hour(s))  CULTURE, BLOOD (ROUTINE X 2) w Reflex to ID Panel     Status: None (Preliminary result)   Collection Time: 02/13/17  4:27 PM  Result Value Ref Range   Specimen Description BLOOD RIGHT HAND    Special Requests      BOTTLES DRAWN AEROBIC AND ANAEROBIC Blood Culture adequate volume   Culture NO GROWTH 2 DAYS    Report Status PENDING   CULTURE, BLOOD (ROUTINE X 2) w Reflex to ID Panel     Status: None (Preliminary result)   Collection Time: 02/13/17  4:27 PM  Result Value Ref Range   Specimen Description BLOOD LEFT ASSIST CONTROL    Special Requests      BOTTLES DRAWN AEROBIC AND ANAEROBIC Blood Culture adequate volume   Culture NO GROWTH 2 DAYS    Report Status PENDING   Basic metabolic panel     Status: Abnormal   Collection Time: 02/14/17  5:47 AM  Result Value Ref Range   Sodium 135 135 - 145 mmol/L   Potassium 3.6 3.5 - 5.1 mmol/L   Chloride 104 101 - 111 mmol/L   CO2 25 22 - 32 mmol/L   Glucose, Bld 112 (H) 65 - 99 mg/dL   BUN 17 6 - 20 mg/dL   Creatinine, Ser 1.04 0.61 - 1.24 mg/dL   Calcium 8.5 (L) 8.9 - 10.3 mg/dL   GFR calc non Af Amer >60 >60 mL/min   GFR calc Af Amer >60 >60 mL/min    Comment: (NOTE) The eGFR has been calculated using the CKD EPI equation. This calculation has not been validated in all clinical situations. eGFR's persistently <60 mL/min signify possible Chronic Kidney Disease.    Anion gap 6 5 - 15  CBC     Status: Abnormal   Collection Time: 02/14/17  5:47 AM  Result Value Ref Range   WBC 20.1 (H) 3.8 - 10.6 K/uL   RBC 4.20 (L) 4.40 - 5.90 MIL/uL   Hemoglobin 12.1 (L) 13.0 - 18.0 g/dL   HCT 36.3  (L) 40.0 - 52.0 %   MCV 86.3 80.0 - 100.0 fL   MCH 28.8 26.0 - 34.0 pg   MCHC 33.3 32.0 - 36.0 g/dL   RDW 17.7 (H) 11.5 - 14.5 %   Platelets 220 150 - 440 K/uL      Imaging Results (Last 48 hours)  Mr Brain Wo Contrast  Result Date: 02/13/2017 CLINICAL DATA:  Altered mental status, possible syncopal episode on toilet. History of stroke, atrial fibrillation on anticoagulation. EXAM: MRI HEAD WITHOUT CONTRAST TECHNIQUE: Multiplanar, multiecho pulse sequences of the brain and surrounding structures were obtained without intravenous contrast. COMPARISON:  CT HEAD February 13, 2017 at 0919 hours an MRI head July 08, 2012 FINDINGS: Mild motion degraded examination. BRAIN: Patchy reduced diffusion RIGHT temporal occipital lobe and to lesser extent RIGHT parietal lobe with low ADC values. Numerous foci of susceptibility artifact and predominately peripheral distribution. Patchy LEFT subarachnoid susceptibility artifact. Minimal peripheral LEFT temporal encephalomalacia. Similar LEFT frontotemporal lobe encephalomalacia. Moderate ventriculomegaly on the basis of global parenchymal brain volume loss. Patchy supratentorial white matter T2 hyperintensities exclusive of the aforementioned abnormality. Old LEFT basal ganglia hemorrhagic infarct. Old pontine lacunar infarcts. VASCULAR: Normal major intracranial vascular flow voids present at skull base. SKULL AND UPPER CERVICAL SPINE: No abnormal sellar expansion. No suspicious calvarial bone marrow signal. Craniocervical junction maintained. SINUSES/ORBITS: Mild paranasal sinus mucosal thickening. LEFT mastoid effusion. The included ocular globes and orbital contents are non-suspicious. OTHER: None. IMPRESSION: 1. Acute small scattered RIGHT MCA and posterior watershed territory infarcts. 2. Old LEFT MCA infarcts, old LEFT basal ganglia hemorrhagic infarct. 3. Extensive peripheral chronic micro hemorrhages and artifact and patchy LEFT superficial siderosis,  findings consistent with amyloid angiopathy. 4. Moderate chronic small vessel ischemic disease. Electronically Signed   By: Elon Alas M.D.   On: 02/13/2017 20:21        Medical Problem List and Plan: 1.  Decreased functional mobility secondary to acute small scattered right MCA and posterior watershed territory infarcts.             -begin therapies today 2.  DVT Prophylaxis/Anticoagulation: Eliquis 3. Pain Management: Flexeril and Ultram as needed. Monitor mental status 4. Mood: Zoloft 25 mg daily, Seroquel 12.5 mg daily at bedtime 5. Neuropsych: This patient is capable of making decisions on his own behalf. 6. Skin/Wound Care: Routine skin checks 7. Fluids/Electrolytes/Nutrition: Routine I&O with follow-up chemistries 8. Atrial fibrillation. Cardiac rate control 9. Escherichia coli UTI. Completed course of Rocephin 10. Hyperlipidemia. Lipitor 11. BPH. Pro scar 5 mg daily. Check PVR 3  Post Admission Physician Evaluation: 1. Functional deficits secondary  to right MCA/posterior watershed territory infarcts 2. Patient is admitted to receive collaborative, interdisciplinary care between the physiatrist, rehab nursing staff, and therapy team. 3. Patient's level of medical complexity and substantial therapy needs in context of that medical necessity cannot be provided at a lesser intensity of care such as a SNF. 4. Patient has experienced substantial functional loss from his/her baseline which was documented above under the "Functional History" and "Functional Status" headings.  Judging by the patient's diagnosis, physical exam, and functional history, the patient has potential for functional progress which will result in measurable gains while on inpatient rehab.  These gains will be of substantial and practical use upon discharge  in facilitating mobility and self-care at the household level. 5. Physiatrist will provide 24 hour management of medical needs as well as oversight  of the therapy plan/treatment and provide guidance as appropriate regarding the interaction of the two. 6. The Preadmission Screening has been reviewed and patient status is unchanged unless otherwise stated above. 7. 24 hour rehab nursing will assist with bladder management, bowel management, safety, skin/wound care, disease management, medication administration, pain management and patient education  and help integrate therapy concepts, techniques,education, etc. 8. PT will assess and treat for/with: Lower extremity strength, range of motion, stamina, balance, functional mobility, safety, adaptive techniques and equipment, NMR, visual-spatial awareness.   Goals are: mod I to supervision. 9. OT will assess and treat for/with: ADL's, functional mobility, safety, upper extremity strength, adaptive techniques and equipment,NMR, visual-spatial awareness, family education, .  Goals are: mod I to supervision. Therapy may proceed with showering this patient. 10. SLP will assess and treat for/with: cognition, communication, behavior, family ed.  Goals are: mod I to supervision. 11. Case Management and Social Worker will assess and treat for psychological issues and discharge planning. 12. Team conference will be held weekly to assess progress toward goals and to determine barriers to discharge. 13. Patient will receive at least 3 hours of therapy per day at least 5 days per week. 14. ELOS: 14-16 days       15. Prognosis:  excellent     Meredith Staggers, MD, [facility] 02/16/2017  Cathlyn Parsons., PA-C 02/15/2017

## 2017-02-16 NOTE — Evaluation (Signed)
Occupational Therapy Assessment and Plan  Patient Details  Name: [name] MRN: [id number] Date of Birth: 15-Oct-1933  OT Diagnosis: acute pain, cognitive deficits, hemiplegia affecting non-dominant side and muscle weakness (generalized) Rehab Potential: Rehab Potential (ACUTE ONLY): Good ELOS: ~3 weeks   Today's Date: 02/16/2017 OT Individual Time:  - 835-930 (55 min )       Problem List:  Patient Active Problem List   Diagnosis Date Noted  . CVA (cerebral vascular accident) ([place]) 02/15/2017  . Acute cerebral infarction ([place])   . Encephalopathy 02/13/2017  . Tachycardia 04/20/2016    Past Medical History:  Past Medical History:  Diagnosis Date  . A-fib ([place])   . GERD (gastroesophageal reflux disease)   . Hypercholesteremia   . Stroke [facility])    Past Surgical History:  Past Surgical History:  Procedure Laterality Date  . TOTAL HIP ARTHROPLASTY Left     Assessment & Plan Clinical Impression: Patient is a 83 y.o. year old male with history of CVA no residual, history of atrial fibrillation on Eliquis. Per chart review patient lives alone independently with assistive device prior to admission. One level home 3 steps to entry. Presented 02/13/2017 with altered mental status as well as chills and a fall while going to the bathroom. Cranial CT scan negative for acute changes. There was a right frontal scalp hematoma. CT angiogram head and neck showed no emergent large vessel occlusion. CT cervical spine negative. MRI of the brain showed acute small scattered right MCA and posterior watershed territory infarct. Patient did not receive TPA. Echocardiogram pending. EEG showed no seizure activity. Patient remains on Eliquisfor CVA prophylaxis. Urine culture 100,000 Escherichia coli maintained on Rocephin. Patient transferred to CIR on 02/15/2017 .    Patient currently requires max with basic self-care skills and functional mobiltiy without AD secondary to muscle weakness,  decreased cardiorespiratoy endurance, impaired timing and sequencing, unbalanced muscle activation, motor apraxia, decreased coordination and decreased motor planning, decreased attention to left and decreased motor planning, decreased attention, decreased awareness, decreased problem solving, decreased safety awareness, decreased memory and delayed processing and decreased sitting balance, decreased standing balance, decreased postural control, hemiplegia, decreased balance strategies and difficulty maintaining precautions.  Prior to hospitalization, patient could complete ADL with independent .  Patient will benefit from skilled intervention to decrease level of assist with basic self-care skills and increase independence with basic self-care skills prior to discharge home with care partner.  Anticipate patient will require 24 hour supervision and follow up home health.  OT - End of Session Activity Tolerance: Tolerates 30+ min activity with multiple rests Endurance Deficit: Yes OT Assessment Rehab Potential (ACUTE ONLY): Good OT Barriers to Discharge: Decreased caregiver support;Incontinence OT Patient demonstrates impairments in the following area(s): Balance;Cognition;Safety;Sensory;Skin Integrity;Edema;Endurance;Motor;Pain;Vision;Perception OT Basic ADL's Functional Problem(s): Grooming;Bathing;Dressing;Toileting OT Transfers Functional Problem(s): Toilet;Tub/Shower OT Additional Impairment(s): Fuctional Use of Upper Extremity OT Plan OT Intensity: Minimum of 1-2 x/day, 45 to 90 minutes OT Frequency: 5 out of 7 days OT Duration/Estimated Length of Stay: ~3 weeks OT Treatment/Interventions: Balance/vestibular training;Discharge planning;Functional electrical stimulation;Pain management;Self Care/advanced ADL retraining;Therapeutic Activities;UE/LE Coordination activities;Visual/perceptual remediation/compensation;Therapeutic Exercise;Skin care/wound managment;Patient/family  education;Functional mobility training;Disease mangement/prevention;Cognitive remediation/compensation;Community reintegration;DME/adaptive equipment instruction;Neuromuscular re-education;Psychosocial support;UE/LE Strength taining/ROM;Wheelchair propulsion/positioning OT Self Feeding Anticipated Outcome(s): n/a OT Basic Self-Care Anticipated Outcome(s): supervision  OT Toileting Anticipated Outcome(s): supervision OT Bathroom Transfers Anticipated Outcome(s): supervision OT Recommendation Patient destination: Home Follow Up Recommendations: 24 hour supervision/assistance Equipment Recommended: To be determined   Skilled Therapeutic Intervention  Ot eval initiated with Ot  goals, purpose and role discussed. SElf care retraining with focus on stand pivot transfers, toileting, sit to stands, bathing at shower level. Pt with no clothes at time of eval, Pt perform sit to stands and stand pivot transfers with mod to maxA with max cues for attention to upright posture and to keep eyes open to follow through safety with tasks. Pt able to bathe with minimal cues for sequencing and thoroughness. Pt with ongoing complaints of pain in bilateral shoulders and neck - RN made aware and mm rub applied. Pt kept eyes closed most of session - not sure if this is cause his face causes discomfort or cognition.  Assisted pt with completing grooming tasks at sink due to decr coordination, eyes being closed and safety with shaving. Pt transferred back to bed to rest at end of session with mod A with bed alarm put on.    OT Evaluation Precautions/Restrictions  Precautions Precautions: Fall Restrictions Weight Bearing Restrictions: No General Chart Reviewed: Yes Family/Caregiver Present: No Vital Signs  Pain Pain Assessment Faces Pain Scale: Hurts whole lot Pain Type: Acute pain Pain Descriptors / Indicators: Aching;Sore;Tender Pain Intervention(s): Massage;Back rub;Shower;Rest (mm cream applied) Home  Living/Prior Functioning Home Living Family/patient expects to be discharged to:: Private residence Living Arrangements: Alone Available Help at Discharge: Family Type of Home: House Home Access: Stairs to enter [employer] of Steps: 3 to enter with hand rails on both sides Entrance Stairs-Rails: Right, Left, Can reach both Home Layout: One level, Other (Comment) Bathroom Shower/Tub: Tub/shower unit  Lives With: Alone ADL ADL ADL Comments: see functional navigator Vision Baseline Vision/History: Wears glasses Wears Glasses: At all times Patient Visual Report: Peripheral vision impairment Alignment/Gaze Preference: Gaze right Tracking/Visual Pursuits: Decreased smoothness of eye movement to LEFT superior field;Decreased smoothness of eye movement to LEFT inferior field Visual Fields: Left visual field deficit Additional Comments: pt kept eyes closed alot of the session  Perception  Perception: Impaired Inattention/Neglect: Does not attend to left visual field Praxis Praxis: Impaired Praxis Impairment Details: Motor planning Cognition Overall Cognitive Status: Impaired/Different from baseline Arousal/Alertness: Awake/alert Orientation Level: Person;Place;Situation Person: Oriented Place: Disoriented Situation: Disoriented Year: 2018 Month: July Day of Week: Incorrect (reported he didnt know) Memory: Impaired Immediate Memory Recall: Sock;Blue;Bed Memory Recall:  (didnt recall any of them) Attention: Sustained Sustained Attention: Appears intact Selective Attention: Impaired Selective Attention Impairment: Functional basic Awareness: Impaired Awareness Impairment: Intellectual impairment;Emergent impairment Problem Solving: Impaired Executive Function:  (all impaired) Safety/Judgment: Impaired Sensation Sensation Light Touch: Appears Intact Hot/Cold: Appears Intact Proprioception: Appears Intact Coordination Gross Motor Movements are Fluid and  Coordinated: No Fine Motor Movements are Fluid and Coordinated: Yes Motor  Motor Motor: Motor apraxia;Abnormal postural alignment and control;Hemiplegia Motor - Skilled Clinical Observations: acute pain Mobility  Transfers Transfers: Sit to Stand;Stand to Sit Sit to Stand: 3: Mod assist Stand to Sit: 4: Min assist  Trunk/Postural Assessment  Cervical Assessment Cervical Assessment:  (rigid neck due to pain) Thoracic Assessment Thoracic Assessment: Within Functional Limits Lumbar Assessment Lumbar Assessment:  (posterior pelvic ilt) Postural Control Postural Control: Deficits on evaluation Righting Reactions: delayed and posterior LOB  Balance Balance Balance Assessed: Yes Dynamic Sitting Balance Dynamic Sitting - Level of Assistance: 4: Min assist Static Standing Balance Static Standing - Balance Support: During functional activity Static Standing - Level of Assistance: 3: Mod assist Dynamic Standing Balance Dynamic Standing - Balance Support: During functional activity Dynamic Standing - Level of Assistance: 3: Mod assist Extremity/Trunk Assessment RUE Assessment RUE Assessment: Within Functional  Limits LUE Assessment LUE Assessment: Within Functional Limits   See Function Navigator for Current Functional Status.   Refer to Care Plan for Long Term Goals  Recommendations for other services: Neuropsych   Discharge Criteria: Patient will be discharged from OT if patient refuses treatment 3 consecutive times without medical reason, if treatment goals not met, if there is a change in medical status, if patient makes no progress towards goals or if patient is discharged from hospital.  The above assessment, treatment plan, treatment alternatives and goals were discussed and mutually agreed upon: by patient  Nicoletta Ba 02/16/2017, 10:59 AM

## 2017-02-16 NOTE — IPOC Note (Signed)
Overall Plan of Care [name] Patient Details Name: [name] MRN: [id number] DOB: 16-May-1934  Admitting Diagnosis: CVA  Hospital Problems: Active Problems:   CVA (cerebral vascular accident) [facility](HCC)     Functional Problem List: Nursing Bladder, Bowel, Endurance, Behavior, Medication Management, Pain, Safety, Sensory, Motor  PT Balance, Behavior, Edema, Endurance, Motor, Nutrition, Pain, Perception, Safety, Sensory, Skin Integrity  OT Balance, Cognition, Safety, Sensory, Skin Integrity, Edema, Endurance, Motor, Pain, Vision, Perception  SLP Cognition  TR         Basic ADL's: OT Grooming, Bathing, Dressing, Toileting     Advanced  ADL's: OT       Transfers: PT Bed Mobility, Bed to Chair, Car, Floor, Occupational psychologisturniture  OT Toilet, Research scientist (life sciences)Tub/Shower     Locomotion: PT Ambulation, Psychologist, prison and probation servicesWheelchair Mobility, Stairs     Additional Impairments: OT Fuctional Use of Upper Extremity  SLP Social Cognition   Problem Solving, Memory, Attention, Awareness  TR      Anticipated Outcomes Item Anticipated Outcome  Self Feeding n/a  Swallowing      Basic self-care  supervision   Toileting  supervision   Bathroom Transfers supervision  Bowel/Bladder  Pt will manage bowel and bladder with min assist   Transfers  supervision assist with LRAD   Locomotion  Supervision assist with LRAD   Communication     Cognition  Min assist   Pain  Pt will manage pain at 3 or less on a scale of 0-10.   Safety/Judgment  Pt will remain free of falls and injury with min assist while in rehab.    Therapy Plan: PT Intensity: Minimum of 1-2 x/day ,45 to 90 minutes PT Frequency: 5 out of 7 days PT Duration Estimated Length of Stay: 15-19  OT Intensity: Minimum of 1-2 x/day, 45 to 90 minutes OT Frequency: 5 out of 7 days OT Duration/Estimated Length of Stay: ~3 weeks SLP Intensity: Minumum of 1-2 x/day, 30 to 90 minutes SLP Frequency: 3 to 5 out of 7 days SLP Duration/Estimated Length of Stay: 21  days        Team Interventions: Nursing Interventions Patient/Family Education, Bladder Management, Bowel Management, Medication Management, Disease Management/Prevention, Pain Management, Cognitive Remediation/Compensation, Discharge Planning  PT interventions Balance/vestibular training, Cognitive remediation/compensation, Community reintegration, Disease management/prevention, Discharge planning, Functional electrical stimulation, DME/adaptive equipment instruction, Functional mobility training, Neuromuscular re-education, Pain management, Psychosocial support, Patient/family education, Skin care/wound management, Splinting/orthotics, Stair training, Therapeutic Activities, Therapeutic Exercise, UE/LE Strength taining/ROM, UE/LE Coordination activities, Visual/perceptual remediation/compensation, Wheelchair propulsion/positioning  OT Interventions Balance/vestibular training, Discharge planning, Functional electrical stimulation, Pain management, Self Care/advanced ADL retraining, Therapeutic Activities, UE/LE Coordination activities, Visual/perceptual remediation/compensation, Therapeutic Exercise, Skin care/wound managment, Patient/family education, Functional mobility training, Disease mangement/prevention, Cognitive remediation/compensation, FirefighterCommunity reintegration, Fish farm managerDME/adaptive equipment instruction, Neuromuscular re-education, Psychosocial support, UE/LE Strength taining/ROM, Wheelchair propulsion/positioning  SLP Interventions Cognitive remediation/compensation, Financial traderCueing hierarchy, Environmental controls, Internal/external aids, Patient/family education  TR Interventions    SW/CM Interventions Discharge Planning, Psychosocial Support, Patient/Family Education    Team Discharge Planning: Destination: PT-Home ,OT- Home , SLP-Home Projected Follow-up: PT-Home health PT, OT-  24 hour supervision/assistance, SLP-Home Health SLP, Outpatient SLP, Skilled Nursing facility, 24 hour  supervision/assistance Projected Equipment Needs: PT-Wheelchair cushion (measurements), Wheelchair (measurements), Rolling walker with 5" wheels, OT- To be determined, SLP-None recommended by SLP Equipment Details: PT- , OT-  Patient/family involved in discharge planning: PT- Family member/caregiver,  OT-Patient, SLP-Patient, Family member/caregiver  MD ELOS: 16-20 days Medical Rehab Prognosis:  Excellent Assessment: The patient has been admitted for CIR therapies with the  diagnosis of Right MCA infarct. The team will be addressing functional mobility, strength, stamina, balance, safety, adaptive techniques and equipment, self-care, bowel and bladder mgt, patient and caregiver education, NMR, visual-perceptual awareness, cognition, communication, ego support. Goals have been set at supervision to min assist .    Ranelle Oyster, MD, [facility]      See Team Conference Notes for weekly updates to the plan of care

## 2017-02-16 NOTE — Progress Notes (Signed)
Nutrition Brief Note  Patient identified on the Malnutrition Screening Tool (MST) Report  Wt Readings from Last 15 Encounters:  02/13/17 174 lb (78.9 kg)  08/31/16 190 lb (86.2 kg)  08/09/16 190 lb (86.2 kg)  04/21/16 187 lb 14.4 oz (85.2 kg)  04/20/16 190 lb (86.2 kg)   Current diet order is heart, patient is consuming approximately 75-90% of meals at this time. Appetite good. Weight loss not significant for time frame. Family at bedside encouraging pt to eat at meals. Labs and medications reviewed.   No nutrition interventions warranted at this time. If nutrition issues arise, please consult RD.   Roslyn SmilingStephanie Aziyah Provencal, MS, RD, LDN Pager # [phone] After hours/ weekend pager # [phone]

## 2017-02-16 NOTE — Progress Notes (Signed)
Pt continues to complain of pain to neck and pain felt when turning head from left to right; pain felt especially at right anterior neck which is tender to touch; pt denies relief from pain after admin of prn tramadol, tylenol, and muscle rub cream; on-call Delle ReiningPamela Love, PA contacted and RN instructed to put ice to the area. Will continue to monitor  Harlow AsaSochima O Judi Jaffe, RN

## 2017-02-17 ENCOUNTER — Inpatient Hospital Stay (HOSPITAL_COMMUNITY): Payer: Medicare Other

## 2017-02-17 ENCOUNTER — Inpatient Hospital Stay (HOSPITAL_COMMUNITY): Payer: Medicare Other | Admitting: Occupational Therapy

## 2017-02-17 ENCOUNTER — Inpatient Hospital Stay (HOSPITAL_COMMUNITY): Payer: Medicare Other | Admitting: Physical Therapy

## 2017-02-17 ENCOUNTER — Inpatient Hospital Stay (HOSPITAL_COMMUNITY): Payer: Medicare Other | Admitting: Speech Pathology

## 2017-02-17 DIAGNOSIS — E784 Other hyperlipidemia: Secondary | ICD-10-CM

## 2017-02-17 DIAGNOSIS — I480 Paroxysmal atrial fibrillation: Secondary | ICD-10-CM

## 2017-02-17 DIAGNOSIS — I63411 Cerebral infarction due to embolism of right middle cerebral artery: Secondary | ICD-10-CM

## 2017-02-17 DIAGNOSIS — M25512 Pain in left shoulder: Secondary | ICD-10-CM | POA: Diagnosis not present

## 2017-02-17 NOTE — Progress Notes (Signed)
Speech Language Pathology Daily Session Note  Patient Details  Name: [name] MRN: [id number] Date of Birth: 22-Feb-1934  Today's Date: 02/17/2017 SLP Individual Time: 1345-1430 SLP Individual Time Calculation (min): 45 min  Short Term Goals: Week 1: SLP Short Term Goal 1 (Week 1): Pt will utilize external aids to recall basic, daily information with mod assist multimodal cues.   SLP Short Term Goal 2 (Week 1): Pt will complete basic, familiar tasks with mod assist multimodal cues for functional problem solving.   SLP Short Term Goal 3 (Week 1): Pt will recognize and correct errors during basic, familiar tasks with mod assist multimodal cues.   SLP Short Term Goal 4 (Week 1): Pt will sustain his attention to basic tasks for 5-7 minute intervals with min assist verbal cues for redirection to task.   SLP Short Term Goal 5 (Week 1): Pt will return demonstration of safety precautions with mod assist multimodal cues.    Skilled Therapeutic Interventions: Skilled treatment session focused on cognition goals. SLP facilitated session by providing Mod A cues for sustained attention to basic task for ~ 3 minutes d/t significant internal distractions with pain. Pt with inconsistent/confused reports of location of pain. He frequently pointed to jaw area but labeled it his shoulder. Pt was oriented x 4 with supervision level cues. He was able to complete basic task with Max A and no self-awareness for errors or self-correcting. Pt left upright in wheelchair, safety belt donned and all needs within reach. Nursing alerted to pt's report of pain. Continue per current plan of care.      Function:    Cognition Comprehension Comprehension assist level: Understands basic 50 - 74% of the time/ requires cueing 25 - 49% of the time  Expression   Expression assist level: Expresses basic 50 - 74% of the time/requires cueing 25 - 49% of the time. Needs to repeat parts of sentences.  Social Interaction  Social Interaction assist level: Interacts appropriately 50 - 74% of the time - May be physically or verbally inappropriate.  Problem Solving Problem solving assist level: Solves basic 25 - 49% of the time - needs direction more than half the time to initiate, plan or complete simple activities  Memory Memory assist level: Recognizes or recalls 25 - 49% of the time/requires cueing 50 - 75% of the time    Pain Pain Assessment Pain Assessment: 0-10 Pain Score: 6  Pain Type: Acute pain Pain Location: Neck Pain Orientation: Right;Left;Posterior Pain Descriptors / Indicators: Aching Pain Frequency: Constant Patients Stated Pain Goal: 2 Pain Intervention(s): Medication (See eMAR)  Therapy/Group: Individual Therapy  [employer] 02/17/2017, 2:34 PM

## 2017-02-17 NOTE — Progress Notes (Signed)
Inpatient Rehabilitation Center Individual Statement of Services  Patient Name:  [name]  Date:  02/17/2017  Welcome to the Inpatient Rehabilitation Center.  Our goal is to provide you with an individualized program based on your diagnosis and situation, designed to meet your specific needs.  With this comprehensive rehabilitation program, you will be expected to participate in at least 3 hours of rehabilitation therapies Monday-Friday, with modified therapy programming on the weekends.  Your rehabilitation program will include the following services:  Physical Therapy (PT), Occupational Therapy (OT), Speech Therapy (ST), 24 hour per day rehabilitation nursing, Neuropsychology, Case Management (Social Worker), Rehabilitation Medicine, Nutrition Services and Pharmacy Services  Weekly team conferences will be held on Tuesdays to discuss your progress.  Your Social Worker will talk with you frequently to get your input and to update you on team discussions.  Team conferences with you and your family in attendance may also be held.  Expected length of stay: 15 - 21 days  Overall anticipated outcome: Supervision  Depending on your progress and recovery, your program may change. Your Social Worker will coordinate services and will keep you informed of any changes. Your Social Worker's name and contact numbers are listed  below.  The following services may also be recommended but are not provided by the Inpatient Rehabilitation Center:   Driving Evaluations  Home Health Rehabiltiation Services  Outpatient Rehabilitation Services   Arrangements will be made to provide these services after discharge if needed.  Arrangements include referral to agencies that provide these services.  Your insurance has been verified to be:  Medicare [facility]  Pertinent information will be shared with your doctor and your insurance company.  Social Worker:   Staci AcostaJenny Lianette Broussard, LCSW  [phone] or [phone]) [phone]  Information discussed with and copy given to patient by: Elvera LennoxPrevatt, [name], 02/17/2017, 1:05 PM

## 2017-02-17 NOTE — Progress Notes (Signed)
Physical Therapy Session Note  Patient Details  Name: [name] MRN: [id number] Date of Birth: Apr 22, 1934  Today's Date: 02/17/2017 PT Individual Time: 0800-0915 PT Individual Time Calculation (min): 75 min   Short Term Goals: Week 1:  PT Short Term Goal 1 (Week 1): Pt will consistently perform bed mobility with supervision assist  PT Short Term Goal 2 (Week 1): Pt will perform sit<>stand and stand pivot transfers with min assist consistently.  PT Short Term Goal 3 (Week 1): Pt will ambulate 15150ft with min assist and LRAD  PT Short Term Goal 4 (Week 1): Pt will propell WC 15550ft with supervision assist  PT Short Term Goal 5 (Week 1): Pt will ascend 4 steps with min assist and BUE support   Skilled Therapeutic Interventions/Progress Updates: Pt received supine asleep in bed, easily awoken and agreeable to treatment. C/o pain 10/10 in L collar bone "when I mash in on it" and 1-2/10 at rest "can just tell it's there"; RN present at beginning of session to administer pain medication. Supine>sit with HOB elevated and minA. Pt threads pants with minA requiring assist for LLE; minA for standing balance while pt pulls up pants and assist for buttoning pants after several unsuccessful attempts. Stand pivot transfer to w/c with minA, short shuffling steps and cues to rotate hips toward chair before sitting. Pt performed self feeding with increased time, cues to don glasses to increase ease of managing food on plate. Performed oral hygiene at sink with increased time to locate items and perform due to BUE incoordination; pt reports "they just feel so wobbly". Stand pivot transfer w/c <>toilet with grab bars and min guard. BUE w/c propulsion x75' for BUE coordination and strengthening; veers to L d/t LUE strength deficits compared to R and incoordination. Gait x200' with RW and min guard; short shuffling steps with repetitive cues to increase step length able to correct but does not maintain >5-10  steps. Performed nustep x10 min BUE/BLE on level 5 with average 50 steps/min for strengthening and endurance. Returned to room totalA. Remained seated in w/c with quick release belt intact, all needs in reach at completion of session.      Therapy Documentation Precautions:  Precautions Precautions: Fall Restrictions Weight Bearing Restrictions: No   See Function Navigator for Current Functional Status.   Therapy/Group: Individual Therapy  [name] 02/17/2017, 9:13 AM

## 2017-02-17 NOTE — Progress Notes (Signed)
Occupational Therapy Session Note  Patient Details  Name: [name] MRN: [id number] Date of Birth: [birth year]  Today's Date: 02/17/2017 OT Individual Time: 1100-1200 OT Individual Time Calculation (min): 60 min (missed 15 min for an xray)   Short Term Goals: Week 1:  OT Short Term Goal 1 (Week 1): Pt will demonstrate improved balance to complete sit to stands from toilet with close S. OT Short Term Goal 2 (Week 1): Pt will demonstrate improved coordination to don pants over L foot with extra time.   OT Short Term Goal 3 (Week 1): Pt will demonstrate improved sitting balance of upright posture when sitting on tub bench.  OT Short Term Goal 4 (Week 1): Pt will complete toileting skills with steadying A.  Skilled Therapeutic Interventions/Progress Updates:    Pt seen this session to address balance and LLE coordination. Pt was already dressed for the day and did not want to undress to bathe. Pt stated his pain in his L clavicle had decreased a great deal.  He stood to RW and ambulated to gym with steadying A as he has a shuffling gait pattern. In gym, pt worked on standing balance and L coordination exercises at parallel bars with side and forward stepping, heel and toe raises.  Pt needed several cues. He is hard of hearing and has limited neck ROM which obstructs his visual field.  He also worked on standing and reaching activities at his waistline level just outside his base of support with min A to stabilize. Pt ambulated back to his room with RW and wanted to stay in his w/c. Pt resting in chair with all needs met.    Therapy Documentation Precautions:  Precautions Precautions: Fall Restrictions Weight Bearing Restrictions: No General: General OT Amount of Missed Time: 15 Minutes   Pain: Pain Assessment Pain Assessment: 0-10 Pain Score: 6  Pain Type: Acute pain Pain Location: Neck Pain Orientation: Right;Left;Posterior Pain Descriptors / Indicators: Aching Pain  Frequency: Constant Patients Stated Pain Goal: 2 Pain Intervention(s): Medication (See eMAR) ADL: ADL ADL Comments: see functional navigator See Function Navigator for Current Functional Status.   Therapy/Group: Individual Therapy  [place] 02/17/2017, 1:25 PM

## 2017-02-17 NOTE — Progress Notes (Signed)
Social Work Assessment and Plan  Patient Details  Name: [name] MRN: [id number] Date of Birth: Jan 27, 1934  Today's Date: 02/16/2017  Problem List:  Patient Active Problem List   Diagnosis Date Noted  . Left shoulder pain 02/17/2017  . CVA (cerebral vascular accident) ([place]) 02/15/2017  . BPH (benign prostatic hyperplasia) 05/01/2016  . Paroxysmal atrial fibrillation ([place]) 05/01/2016  . Hyperlipidemia, unspecified 05/01/2016   Past Medical History:  Past Medical History:  Diagnosis Date  . A-fib ([place])   . GERD (gastroesophageal reflux disease)   . Hypercholesteremia   . Stroke [facility])    Past Surgical History:  Past Surgical History:  Procedure Laterality Date  . TOTAL HIP ARTHROPLASTY Left    Social History:  reports that he has never smoked. He has never used smokeless tobacco. He reports that he does not drink alcohol or use drugs.  Family / Support Systems Marital Status: Widow/Widower Patient Roles: Parent, Other (Comment) (grandparent; sibling; friend) Children: 5 children [name] - dtr - [phone] (h); [phone] (m);  [name] - dtr - [phone] Other Supports: siblings; friends Anticipated Caregiver: Family to coordinate what is advised by team.  CSW made sure that brother and pt heard that pt will need 24/7 supervision. Ability/Limitations of Caregiver: They all work full or part-time, so they will have to coordinate schedules.  Brother stated that pt has retired siblings, so 24/7 supervision should not be a problem. Caregiver Availability: 24/7 Family Dynamics: supportive, but busy family  Social History Preferred language: English Religion: Baptist Read: Yes Write: Yes Employment Status: Retired Public relations account executive Issues: none reported Guardian/Conservator: N/A - MD has determined that pt is capable of making his own decisions.   Abuse/Neglect Physical Abuse: Denies Verbal Abuse: Denies Sexual Abuse:  Denies Exploitation of patient/patient's resources: Denies Self-Neglect: Denies  Emotional Status Pt's affect, behavior and adjustment status: Pt was in good spirits during CSW visit, but reported neck/throat pain.  Nursing was aware of this already, but CSW made ST aware as she was next to see pt. Recent Psychosocial Issues: Pt with recent hip replacement 7 months ago. Psychiatric History: none reported Substance Abuse History: none reported  Patient / Family Perceptions, Expectations & Goals Pt/Family understanding of illness & functional limitations: Pt/family report an understanding of pt's condition and limitations.  Family noted that pt had memory issues prior to the stroke. Premorbid pt/family roles/activities: Pt enjoys restoring old cars.  He also has friends and family with whom he enjoys spending time. Anticipated changes in roles/activities/participation: Pt hopes to eventually resume working on cars after he has rehabilitated. Pt/family expectations/goals: Pt would like to get back to doing the things he enjoys.  Community Resources Express Scripts: None Premorbid Home Care/DME Agencies: None Transportation available at discharge: family Resource referrals recommended: Neuropsychology, Support group (specify) (stroke support group)  Discharge Planning Living Arrangements: Alone Support Systems: Children, Other relatives, Friends/neighbors Type of Residence: Private residence Insurance Resources: Chartered certified accountant Resources: Radio broadcast assistant Screen Referred: No Money Management: Patient Does the patient have any problems obtaining your medications?: No Home Management: Pt was taking care of the inside and outside of his home. Patient/Family Preliminary Plans: Family plans to work out their schedules so that someone will be with pt 24/7. Sw Barriers to Discharge: Lack of/limited family support Sw Barriers to Discharge Comments: family has to coordinate 24/7  supervision Social Work Anticipated Follow Up Needs: HH/OP, Support Group Expected length of stay: 15 to 21 days  Clinical  Impression CSW met with pt and his brother and son to introduce self and role of CSW, as well as to complete assessment.  Pt was in pretty good spirits, but some pain, as well.  Brother is very pleased that pt could come to CIR and is impressed with what he's seen of the program.  Pt is motivated to get better and wants to work hard.  Pt has good family support, but everyone works and their schedules will have to be coordinated so that they can provide 24/7 supervision to pt.  CSW will continue to follow and assist as needed throughout pt's stay.  [name], Silvestre Mesi 02/17/2017, 3:04 PM

## 2017-02-17 NOTE — Progress Notes (Signed)
Assisted to BR at 0130  Voided small amount, PVR=572, I & O cath=475. Easily frustrated when bed alarm goes off.  Complained of pain at clavicle-heat pack applied and PRN tylenol and flexeril given. Rested quietly since meds given. Gregory MartinezMurray, Jalyssa Fleisher A

## 2017-02-17 NOTE — Plan of Care (Signed)
Problem: RH SAFETY Goal: RH STG ADHERE TO SAFETY PRECAUTIONS W/ASSISTANCE/DEVICE STG Adhere to Safety Precautions With Min Assistance/Device.   Outcome: Not Progressing Continues to attempt to get up without assistance.

## 2017-02-17 NOTE — Progress Notes (Signed)
[name] is a 83 y.o. male 10-Sep-1933 [id number]  Subjective: L shoulder pain with any movement, new in past 24h. No radiation into arm or neck. Painful to move or touch. Declines pain meds but would consider muscle rub  Objective: Vital signs in last 24 hours: Temp:  [97.8 F (36.6 C)-98 F (36.7 C)] 98 F (36.7 C) (07/14 0700) Pulse Rate:  [60-67] 60 (07/14 0700) Resp:  [18-19] 19 (07/14 0700) BP: (140-142)/(68-80) 140/80 (07/14 0700) SpO2:  [96 %-98 %] 98 % (07/14 0700) Weight change:  Last BM Date: 02/14/17  Intake/Output from previous day: 07/13 0701 - 07/14 0700 In: 960 [P.O.:960] Out: 1676 [Urine:1676]  Physical Exam General: No apparent distress   Supine in bed, Facial contusions improving Lungs: Normal effort. Lungs clear to auscultation, no crackles or wheezes. Cardiovascular: iregular rate and rhythm, no edema Musculoskeletal:  Pain with ROM L shoulder and palpation of shoulder, no deformities appreciated Neurological: No new neurological deficits   Lab Results: BMET    Component Value Date/Time   NA 135 02/16/2017 0507   NA 140 06/27/2013 0234   K 3.2 (L) 02/16/2017 0507   K 3.9 06/27/2013 0234   CL 103 02/16/2017 0507   CL 108 (H) 06/27/2013 0234   CO2 24 02/16/2017 0507   CO2 26 06/27/2013 0234   GLUCOSE 127 (H) 02/16/2017 0507   GLUCOSE 130 (H) 06/27/2013 0234   BUN 11 02/16/2017 0507   BUN 29 (H) 06/27/2013 0234   CREATININE 0.77 02/16/2017 0507   CREATININE 1.35 (H) 06/27/2013 0234   CALCIUM 8.8 (L) 02/16/2017 0507   CALCIUM 9.0 06/27/2013 0234   GFRNONAA >60 02/16/2017 0507   GFRNONAA 51 (L) 06/27/2013 0234   GFRAA >60 02/16/2017 0507   GFRAA 59 (L) 06/27/2013 0234   CBC    Component Value Date/Time   WBC 11.5 (H) 02/16/2017 0507   RBC 4.31 02/16/2017 0507   HGB 12.3 (L) 02/16/2017 0507   HGB 14.6 06/27/2013 0234   HCT 38.0 (L) 02/16/2017 0507   HCT 42.8 06/27/2013 0234   PLT 267 02/16/2017 0507   PLT 275 06/27/2013  0234   MCV 88.2 02/16/2017 0507   MCV 91 06/27/2013 0234   MCH 28.5 02/16/2017 0507   MCHC 32.4 02/16/2017 0507   RDW 16.3 (H) 02/16/2017 0507   RDW 14.0 06/27/2013 0234   LYMPHSABS 1.3 02/16/2017 0507   LYMPHSABS 1.4 06/27/2013 0234   MONOABS 1.1 (H) 02/16/2017 0507   MONOABS 0.7 06/27/2013 0234   EOSABS 0.2 02/16/2017 0507   EOSABS 0.1 06/27/2013 0234   BASOSABS 0.0 02/16/2017 0507   BASOSABS 0.0 06/27/2013 0234   CBG's (last 3):  No results for input(s): GLUCAP in the last 72 hours. LFT's Lab Results  Component Value Date   ALT 50 02/16/2017   AST 39 02/16/2017   ALKPHOS 105 02/16/2017   BILITOT 0.8 02/16/2017    Studies/Results: Dg Neck Soft Tissue  Result Date: 02/16/2017 CLINICAL DATA:  Neck pain unresolved after ice was applied to it. Recent fall EXAM: NECK SOFT TISSUES - 1+ VIEW COMPARISON:  CT 02/13/2017 FINDINGS: There is no evidence of retropharyngeal soft tissue swelling or epiglottic enlargement. The cervical airway is unremarkable and no radio-opaque foreign body identified. Mild spondylitic changes in the visualized cervical spine. The patient is edentulous. IMPRESSION: Negative. Electronically Signed   By: Corlis Leak  Hassell M.D.   On: 02/16/2017 13:03    Medications:  I have reviewed the patient's current medications. Scheduled Medications: .  apixaban  5 mg Oral BID  . aspirin EC  81 mg Oral Daily  . atorvastatin  80 mg Oral Daily  . cholecalciferol  1,000 Units Oral Daily  . ferrous sulfate  325 mg Oral Q breakfast  . finasteride  5 mg Oral Daily  . pantoprazole  40 mg Oral Daily  . QUEtiapine  12.5 mg Oral QHS  . sertraline  25 mg Oral Daily   PRN Medications: acetaminophen **OR** acetaminophen, cyclobenzaprine, docusate sodium, MUSCLE RUB, ondansetron **OR** ondansetron (ZOFRAN) IV, sorbitol, traMADol  Assessment/Plan: Principal Problem:   CVA (cerebral vascular accident) ([facility]) Active Problems:   BPH (benign prostatic hyperplasia)   Paroxysmal atrial  fibrillation ([facility])   Hyperlipidemia, unspecified   Left shoulder pain  1. R MCA CVA - continue CIR as ongoing and medical mgmt of cofactors 2. PAF - continue Eliquis; rate controlled 3. Dyslipidemia - continue statin 4. BPH - on Proscar 5. L shoulder pain - ?injury with fall PTA on 7/10? Check plain film and topical analgesia as requested  Length of stay, days: 2    Carizma Dunsworth A. Felicity Coyer, MD 02/17/2017, 9:07 AM

## 2017-02-18 DIAGNOSIS — E876 Hypokalemia: Secondary | ICD-10-CM

## 2017-02-18 LAB — CULTURE, BLOOD (ROUTINE X 2)
Culture: NO GROWTH
Culture: NO GROWTH
Special Requests: ADEQUATE
Special Requests: ADEQUATE

## 2017-02-18 MED ORDER — POTASSIUM CHLORIDE CRYS ER 20 MEQ PO TBCR
20.0000 meq | EXTENDED_RELEASE_TABLET | Freq: Two times a day (BID) | ORAL | Status: AC
  Administered 2017-02-18 – 2017-02-19 (×4): 20 meq via ORAL
  Filled 2017-02-18 (×4): qty 1

## 2017-02-18 NOTE — Progress Notes (Signed)
More alert and oriented, called appropriately to go to BR. 3 continent BM's after sorbitol given on previous shift. Continues to retain urine, at 0030 I & O cath=450cc's and at 0630 I & O cath=500cc's. PRN ultram given at 2107 for complaint of left neck pain. PRN flexeril and tylenol given at 0030 for neck pain. Alfredo MartinezMurray, Bristyl Mclees A

## 2017-02-18 NOTE — Progress Notes (Signed)
[name] is a 83 y.o. male 1934/01/17 [id number]  Subjective: L shoulder pain with any movement, new in past 24h. No radiation into arm or neck. Painful to move or touch. Declines pain meds but would consider muscle rub  Objective: Vital signs in last 24 hours: Temp:  [98.1 F (36.7 C)-98.3 F (36.8 C)] 98.3 F (36.8 C) (07/15 0535) Pulse Rate:  [63-72] 63 (07/15 0535) Resp:  [18] 18 (07/15 0535) BP: (123-146)/(60-69) 146/69 (07/15 0535) SpO2:  [97 %-99 %] 97 % (07/15 0535) Weight change:  Last BM Date: 02/17/17  Intake/Output from previous day: 07/14 0701 - 07/15 0700 In: 925 [P.O.:925] Out: 1250 [Urine:1250]  Physical Exam General: No apparent distress   Supine in bed, Facial contusions improving Lungs: Normal effort. Lungs clear to auscultation, no crackles or wheezes. Cardiovascular: iregular rate and rhythm, no edema Musculoskeletal:  Pain with ROM L shoulder and palpation of shoulder, no deformities appreciated Neurological: No new neurological deficits   Lab Results: BMET    Component Value Date/Time   NA 135 02/16/2017 0507   NA 140 06/27/2013 0234   K 3.2 (L) 02/16/2017 0507   K 3.9 06/27/2013 0234   CL 103 02/16/2017 0507   CL 108 (H) 06/27/2013 0234   CO2 24 02/16/2017 0507   CO2 26 06/27/2013 0234   GLUCOSE 127 (H) 02/16/2017 0507   GLUCOSE 130 (H) 06/27/2013 0234   BUN 11 02/16/2017 0507   BUN 29 (H) 06/27/2013 0234   CREATININE 0.77 02/16/2017 0507   CREATININE 1.35 (H) 06/27/2013 0234   CALCIUM 8.8 (L) 02/16/2017 0507   CALCIUM 9.0 06/27/2013 0234   GFRNONAA >60 02/16/2017 0507   GFRNONAA 51 (L) 06/27/2013 0234   GFRAA >60 02/16/2017 0507   GFRAA 59 (L) 06/27/2013 0234   CBC    Component Value Date/Time   WBC 11.5 (H) 02/16/2017 0507   RBC 4.31 02/16/2017 0507   HGB 12.3 (L) 02/16/2017 0507   HGB 14.6 06/27/2013 0234   HCT 38.0 (L) 02/16/2017 0507   HCT 42.8 06/27/2013 0234   PLT 267 02/16/2017 0507   PLT 275 06/27/2013  0234   MCV 88.2 02/16/2017 0507   MCV 91 06/27/2013 0234   MCH 28.5 02/16/2017 0507   MCHC 32.4 02/16/2017 0507   RDW 16.3 (H) 02/16/2017 0507   RDW 14.0 06/27/2013 0234   LYMPHSABS 1.3 02/16/2017 0507   LYMPHSABS 1.4 06/27/2013 0234   MONOABS 1.1 (H) 02/16/2017 0507   MONOABS 0.7 06/27/2013 0234   EOSABS 0.2 02/16/2017 0507   EOSABS 0.1 06/27/2013 0234   BASOSABS 0.0 02/16/2017 0507   BASOSABS 0.0 06/27/2013 0234   CBG's (last 3):  No results for input(s): GLUCAP in the last 72 hours. LFT's Lab Results  Component Value Date   ALT 50 02/16/2017   AST 39 02/16/2017   ALKPHOS 105 02/16/2017   BILITOT 0.8 02/16/2017    Studies/Results: Dg Neck Soft Tissue  Result Date: 02/16/2017 CLINICAL DATA:  Neck pain unresolved after ice was applied to it. Recent fall EXAM: NECK SOFT TISSUES - 1+ VIEW COMPARISON:  CT 02/13/2017 FINDINGS: There is no evidence of retropharyngeal soft tissue swelling or epiglottic enlargement. The cervical airway is unremarkable and no radio-opaque foreign body identified. Mild spondylitic changes in the visualized cervical spine. The patient is edentulous. IMPRESSION: Negative. Electronically Signed   By: Corlis Leak M.D.   On: 02/16/2017 13:03   Dg Shoulder Left  Result Date: 02/17/2017 LEFT CLINICAL DATA: Left shoulder pain.  No known injury. EXAM: LEFT SHOULDER - 2+ VIEW COMPARISON:  None. FINDINGS: Mild degenerative changes in the [facility] joint with joint space narrowing and spurring. Subacromial spurring noted. Glenohumeral joint is maintained. No acute bony abnormality. Specifically, no fracture, subluxation, or dislocation. Soft tissues are intact. IMPRESSION: Degenerative changes in the left AC joint. No acute bony abnormality. Electronically Signed   By: Charlett NoseKevin  Dover M.D.   On: 02/17/2017 10:58    Medications:  I have reviewed the patient's current medications. Scheduled Medications: . apixaban  5 mg Oral BID  . aspirin EC  81 mg Oral Daily  .  atorvastatin  80 mg Oral Daily  . cholecalciferol  1,000 Units Oral Daily  . ferrous sulfate  325 mg Oral Q breakfast  . finasteride  5 mg Oral Daily  . pantoprazole  40 mg Oral Daily  . QUEtiapine  12.5 mg Oral QHS  . sertraline  25 mg Oral Daily   PRN Medications: acetaminophen **OR** acetaminophen, cyclobenzaprine, docusate sodium, MUSCLE RUB, ondansetron **OR** ondansetron (ZOFRAN) IV, sorbitol, traMADol  Assessment/Plan: Principal Problem:   CVA (cerebral vascular accident) ([facility]) Active Problems:   BPH (benign prostatic hyperplasia)   Paroxysmal atrial fibrillation ([facility])   Hyperlipidemia, unspecified   Left shoulder pain  1. R MCA CVA - continue CIR as ongoing and medical mgmt of co morbidity 2. PAF - continue Eliquis; rate controlled 3. Dyslipidemia - continue statin 4. BPH - on Proscar 5. L shoulder pain - injury with fall PTA on 7/10? 7/14 plain film shows AC degenerative change; continue topical and oral analgesia as needed 6. Hypokalemia - replace (20 BID x 4 doses) and recheck lytes next week  Length of stay, days: 3    Bessie Boyte A. Felicity CoyerLeschber, MD 02/18/2017, 8:39 AM

## 2017-02-18 NOTE — Plan of Care (Signed)
Problem: RH BLADDER ELIMINATION Goal: RH STG MANAGE BLADDER WITH ASSISTANCE STG Manage Bladder With Mod Assistance   Outcome: Not Progressing Continues to require I & O caths for retention Goal: RH STG MANAGE BLADDER WITH EQUIPMENT WITH ASSISTANCE STG Manage Bladder With Equipment With mod Assistance   Outcome: Not Progressing Continues to require I & O caths for retention.

## 2017-02-19 ENCOUNTER — Inpatient Hospital Stay (HOSPITAL_COMMUNITY): Payer: Medicare Other | Admitting: Occupational Therapy

## 2017-02-19 ENCOUNTER — Inpatient Hospital Stay (HOSPITAL_COMMUNITY): Payer: Medicare Other | Admitting: Speech Pathology

## 2017-02-19 ENCOUNTER — Inpatient Hospital Stay (HOSPITAL_COMMUNITY): Payer: Medicare Other

## 2017-02-19 DIAGNOSIS — R338 Other retention of urine: Secondary | ICD-10-CM

## 2017-02-19 DIAGNOSIS — N401 Enlarged prostate with lower urinary tract symptoms: Secondary | ICD-10-CM

## 2017-02-19 MED ORDER — TAMSULOSIN HCL 0.4 MG PO CAPS
0.4000 mg | ORAL_CAPSULE | Freq: Every day | ORAL | Status: DC
  Administered 2017-02-19 – 2017-02-22 (×4): 0.4 mg via ORAL
  Filled 2017-02-19 (×4): qty 1

## 2017-02-19 NOTE — Progress Notes (Signed)
Occupational Therapy Session Note  Patient Details  Name: [name] MRN: [id number]         Make-up Session Date of Birth: [birth year]  Today's Date: 02/19/2017 OT Individual Time: [id number] OT Individual Time Calculation (min): 27 min    Skilled Therapeutic Interventions/Progress Updates:    Pt pleasant sitting in wheelchair at start of session.  Ambulated to the the dayroom to practice transfers to regular couch with use of the RW.  Min instructional cueing to stay inside of the walker during transfers as he would step outside of it.  Noted short step length with overall min assist for balance.  Pt maintains flexed neck and rounded shoulders as well.  Short shuffling steps noted at times with occasional running of the walker into the left side of doorways or objects.  He was able to state that he has some trouble seeing on the left side as well.  Completed use of [employer] in standing.  Pt with overall 2-2.5 seconds to locate lights left of midline.  One and a half second average for locating lights on the right of midline.  Pt needing min assist for balance when standing to complete this.  Finished session with ambulation back to the room with pt being left up in wheelchair and OT therapist present for next session.    Therapy Documentation Precautions:  Precautions Precautions: Fall Restrictions Weight Bearing Restrictions: No  Pain: Pain Assessment Pain Assessment: No/denies pain ADL: See Function Navigator for Current Functional Status.   Therapy/Group: Individual Therapy  Shawnte Winton OTR/L 02/19/2017, 2:15 PM

## 2017-02-19 NOTE — Progress Notes (Signed)
[facility]     PROGRESS NOTE    Subjective/Complaints: Had a fair night. Pt still requiring I/O caths.   Objective: Vital Signs: Blood pressure (!) 158/63, pulse 60, temperature 98.1 F (36.7 C), temperature source Oral, resp. rate 18, SpO2 98 %. Dg Shoulder Left  Result Date: 02/17/2017 LEFT CLINICAL DATA: Left shoulder pain.  No known injury. EXAM: LEFT SHOULDER - 2+ VIEW COMPARISON:  None. FINDINGS: Mild degenerative changes [facility] the [facility] with joint space narrowing and spurring. Subacromial spurring noted. Glenohumeral joint is maintained. No acute bony abnormality. Specifically, no fracture, subluxation, or dislocation. Soft tissues are intact. IMPRESSION: Degenerative changes in the left AC joint. No acute bony abnormality. Electronically Signed   By: Charlett NoseKevin  Dover M.D.   On: 02/17/2017 10:58   No results for input(s): WBC, HGB, HCT, PLT in the last 72 hours. No results for input(s): NA, K, CL, GLUCOSE, BUN, CREATININE, CALCIUM in the last 72 hours.  Invalid input(s): CO CBG (last 3)  No results for input(s): GLUCAP in the last 72 hours.  Wt Readings from Last 3 Encounters:  02/13/17 78.9 kg (174 lb)  08/31/16 86.2 kg (190 lb)  08/09/16 86.2 kg (190 lb)    Physical Exam:  HENT:  Head: Normocephalic.  Eyes: Pupils are equal, round, and reactive to light. Conjunctivaeand EOMare normal.  Neck: Normal range of motion. Neck supple. No tracheal deviationpresent. No thyromegalypresent.  Cardiovascular: RRR without murmur. No JVD  Respiratory: CTA Bilaterally without wheezes or rales. Normal effort .  GI: Soft. Bowel sounds are normal. He exhibits no distension. There is no tenderness. There is no rebound.  Neurological: He is alert.  Speech is fluent but sometimes meanders.  Provides name and age as well as follow simple commands. Oriented to place, month, year. Fair sitting balance although leans to left. Mild left pronator drift. Motor  3+ to 4/5 UE's. LE: 4/5 prox to distal. Sensory function grossly intact to LT/pain--stable motor Skin: Skin is warmand dry.  Skin. Warm and dry  Assessment/Plan: 1. Functional and cognitive deficits secondary to right MCA infarct which require 3+ hours per day of interdisciplinary therapy in a comprehensive inpatient rehab setting. Physiatrist is providing close team supervision and 24 hour management of active medical problems listed below. Physiatrist and rehab team continue to assess barriers to discharge/monitor patient progress toward functional and medical goals.  Function:  Bathing Bathing position   Position: Shower  Bathing parts Body parts bathed by patient: Right arm, Left arm, Chest, Abdomen, Right upper leg, Left upper leg, Right lower leg, Left lower leg Body parts bathed by helper: Front perineal area, Buttocks, Back  Bathing assist Assist Level: Touching or steadying assistance(Pt > 75%)      Upper Body Dressing/Undressing Upper body dressing   What is the patient wearing?: Hospital gown                Upper body assist Assist Level: Touching or steadying assistance(Pt > 75%)      Lower Body Dressing/Undressing Lower body dressing   What is the patient wearing?: Pants, Shoes     Pants- Performed by patient: Thread/unthread right pants leg, Pull pants up/down Pants- Performed by helper: Thread/unthread left pants leg, Fasten/unfasten pants   Non-skid slipper socks- Performed by helper: Don/doff right sock, Don/doff left sock       Shoes - Performed by helper: Don/doff right shoe, Don/doff left shoe          Lower  body assist Assist for lower body dressing: Touching or steadying assistance (Pt > 75%)      Toileting Toileting   Toileting steps completed by patient: Adjust clothing prior to toileting, Performs perineal hygiene Toileting steps completed by helper: Adjust clothing after toileting Toileting Assistive Devices: Grab bar or rail   Toileting assist Assist level: Supervision or verbal cues   Transfers Chair/bed transfer   Chair/bed transfer method: Stand pivot Chair/bed transfer assist level: Touching or steadying assistance (Pt > 75%)       Locomotion Ambulation     Max distance: 240 Assist level: Touching or steadying assistance (Pt > 75%)   Wheelchair   Type: Manual Max wheelchair distance: 100 Assist Level: Touching or steadying assistance (Pt > 75%)  Cognition Comprehension Comprehension assist level: Understands basic 75 - 89% of the time/ requires cueing 10 - 24% of the time  Expression Expression assist level: Expresses basic 75 - 89% of the time/requires cueing 10 - 24% of the time. Needs helper to occlude trach/needs to repeat words.  Social Interaction Social Interaction assist level: Interacts appropriately 75 - 89% of the time - Needs redirection for appropriate language or to initiate interaction.  Problem Solving Problem solving assist level: Solves basic 50 - 74% of the time/requires cueing 25 - 49% of the time  Memory Memory assist level: Recognizes or recalls 50 - 74% of the time/requires cueing 25 - 49% of the time  Medical Problem List and Plan: 1. 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 -continue CIR therapies 2. DVT Prophylaxis/Anticoagulation: Eliquis 3. Pain Management: Flexeril and Ultram as needed. Monitor mental status 4. Mood: Zoloft 25 mg daily, Seroquel 12.5 mg daily at bedtime 5. Neuropsych: This patient iscapable of making decisions on hisown behalf. 6. Skin/Wound Care: Routine skin checks 7. Fluids/Electrolytes/Nutrition: Routine I&O with follow-up chemistries reviewed 8.Atrial fibrillation. Cardiac rate controlled at present 9.Escherichia coli UTI. Completed course of Rocephin. Still retaining urine  -re-check urine culture 10.Hyperlipidemia. Lipitor 11.BPH. Pro scar 5 mg  daily. Check PVR 3  -begin trial of flomax  -up to toilet to void/double void  LOS (Days) 4 A FACE TO FACE EVALUATION WAS PERFORMED  Ranelle Oyster, MD 02/19/2017 8:54 AM

## 2017-02-19 NOTE — Progress Notes (Signed)
Occupational Therapy Session Note  Patient Details  Name: [name] MRN: [id number] Date of Birth: 1933-08-22  Today's Date: 02/19/2017 OT Individual Time: 1100-1159 and 1337-1401 OT Individual Time Calculation (min): 59 min and 24 min    Short Term Goals: Week 1:  OT Short Term Goal 1 (Week 1): Pt will demonstrate improved balance to complete sit to stands from toilet with close S. OT Short Term Goal 2 (Week 1): Pt will demonstrate improved coordination to don pants over L foot with extra time.   OT Short Term Goal 3 (Week 1): Pt will demonstrate improved sitting balance of upright posture when sitting on tub bench.  OT Short Term Goal 4 (Week 1): Pt will complete toileting skills with steadying A.  Skilled Therapeutic Interventions/Progress Updates:    Session 1: Upon entering the room, pt seated in wheelchair awaiting therapist without c/o pain. Pt ambulates with RW to dresser to obtain clothing items with steady assistance and increased time to locate clothing items. Pt ambulates to bathroom with RW with min A and needing mod multimodal cues for sequencing and initiation for undressing and bathing self from shower level. Pt standing to wash buttocks and peri area with steady assistance for balance. Pt donning clothing while seated in wheelchair at sink. Pt needing min A for standing balance during LB clothing management. Pt standing at sink with min A while brushing teeth with mod verbal cues to locate items on L side of sink for grooming. Pt returning to wheelchair at end of session with call bell and all needed items within reach.   Session 2: Upon entering the room, pt transitioning from therapy session without difficulty. Pt seated in wheelchair with no c/o pain and agreeable to OT intervention. Pt engaged in visual scanning activity with written line of letters and numbers and asked to clearly mark through all numbers. Pt starts from R and attempting to scan to L with  increasing difficulty past midline. Pt crossing out the same number multiple times, missing numbers on L side of paper, and requiring mod cues to scan L > R with task. OT discussing use of boarders as a strategies. Pt verbalized understanding and education to continue. Pt remained in wheelchair with quick release belt donned and call bell within reach.   Therapy Documentation Precautions:  Precautions Precautions: Fall Restrictions Weight Bearing Restrictions: No General:   Vital Signs: Therapy Vitals Temp: 97.6 F (36.4 C) Temp Source: Oral Resp: 18 BP: (!) 110/53 Patient Position (if appropriate): Sitting Oxygen Therapy SpO2: 99 % O2 Device: Not Delivered Pain: Pain Assessment Pain Score: 0-No pain ADL: ADL ADL Comments: see functional navigator  See Function Navigator for Current Functional Status.   Therapy/Group: Individual Therapy  Alen BleacherBradsher, Haig Gerardo P 02/19/2017, 12:48 PM

## 2017-02-19 NOTE — Plan of Care (Signed)
Problem: RH BLADDER ELIMINATION Goal: RH STG MANAGE BLADDER WITH MEDICATION WITH ASSISTANCE STG Manage Bladder With Medication With Mod Assistance.   Outcome: Not Progressing Urinary retention requiring I&O caths

## 2017-02-19 NOTE — Progress Notes (Signed)
Speech Language Pathology Daily Session Note  Patient Details  Name: [name] MRN: [id number] Date of Birth: [birth year]  Today's Date: 02/19/2017 SLP Individual Time: [id number] SLP Individual Time Calculation (min): 45 min  Short Term Goals: Week 1: SLP Short Term Goal 1 (Week 1): Pt will utilize external aids to recall basic, daily information with mod assist multimodal cues.   SLP Short Term Goal 2 (Week 1): Pt will complete basic, familiar tasks with mod assist multimodal cues for functional problem solving.   SLP Short Term Goal 3 (Week 1): Pt will recognize and correct errors during basic, familiar tasks with mod assist multimodal cues.   SLP Short Term Goal 4 (Week 1): Pt will sustain his attention to basic tasks for 5-7 minute intervals with min assist verbal cues for redirection to task.   SLP Short Term Goal 5 (Week 1): Pt will return demonstration of safety precautions with mod assist multimodal cues.    Skilled Therapeutic Interventions: Skilled treatment session focused on cognitive goals. Patient independently set-up his breakfast tray and demonstrated alternating attention between self-feeding and functional conversation for ~15 minutes with Mod I. Patient also recalled the functions of his current medications with 50% accuracy and demonstrated difficulty with mental flexibility in regards to home medications vs current medications. Patient left sitting upright in bed with alarm on and all needs within reach. Continue with current plan of care.       Function:  Eating Eating   Modified Consistency Diet: No Eating Assist Level: No help, No cues           Cognition Comprehension Comprehension assist level: Understands basic 75 - 89% of the time/ requires cueing 10 - 24% of the time  Expression   Expression assist level: Expresses basic 75 - 89% of the time/requires cueing 10 - 24% of the time. Needs helper to occlude trach/needs to repeat words.  Social  Interaction Social Interaction assist level: Interacts appropriately 75 - 89% of the time - Needs redirection for appropriate language or to initiate interaction.  Problem Solving Problem solving assist level: Solves basic 50 - 74% of the time/requires cueing 25 - 49% of the time  Memory Memory assist level: Recognizes or recalls 50 - 74% of the time/requires cueing 25 - 49% of the time    Pain No/Denies Pain   Therapy/Group: Individual Therapy  [name] 02/19/2017, 9:16 AM

## 2017-02-19 NOTE — Progress Notes (Signed)
Physical Therapy Session Note  Patient Details  Name: [name] MRN: [id number] Date of Birth: 1934-02-26  Today's Date: 02/19/2017 PT Individual Time: 0930-1030 PT Individual Time Calculation (min): 60 min   Short Term Goals: Week 1:  PT Short Term Goal 1 (Week 1): Pt will consistently perform bed mobility with supervision assist  PT Short Term Goal 2 (Week 1): Pt will perform sit<>stand and stand pivot transfers with min assist consistently.  PT Short Term Goal 3 (Week 1): Pt will ambulate 1150ft with min assist and LRAD  PT Short Term Goal 4 (Week 1): Pt will propell WC 12050ft with supervision assist  PT Short Term Goal 5 (Week 1): Pt will ascend 4 steps with min assist and BUE support   Skilled Therapeutic Interventions/Progress Updates:   Pt able to come to EOB with supervision and don socks and shoes to prepare for therapy session with set-up assist. Min assist for sit <> stands and transfers with RW with cues for hand placement and technique for safety due to tendency for posterior lean. Min assist for gait with RW down to therapy gym with improved stride length noted but during turns or obstacle negotiation, requires cues for attention to L, staying inside of RW, and increasing step length. Neuro re-ed for dynamic balance retraining on compliant surface while performing functional reaching task with 1 UE support with min to mod assist (posterior LOB) to toss matching bean bags to the color blocks on the floor. Stair negotiation training x 8 steps with min assist for balance for home entry re-training. Pt feels like he isn't doing great because he is so shaky but encouragement provided throughout session and pt grateful. Nustep for neuro re-ed for reciprocal movement pattern retraining and functional strengthening/endurance x 10 min on level 5 with BUE/BLE.   Therapy Documentation Precautions:  Precautions Precautions: Fall Restrictions Weight Bearing Restrictions:  No Pain: Reports minimal discomfort at collarbone but denies intervention at this time.   See Function Navigator for Current Functional Status.   Therapy/Group: Individual Therapy  [name], Etana Beets [name]  Lindley Hiney B. Laci Frenkel, PT, DPT  02/19/2017, 10:53 AM

## 2017-02-19 NOTE — Progress Notes (Signed)
Continues with urinary retention. At 2330, I & O cath =47950ml, at 0600, I & O cath=600cc's. Reports history of urinary urgency and dribbling of urine PTA. Scrotal edema. Calling appropriately, no unsafe behaviors. PRN ultram given at 2156 for complaint of left shoulder pain. Pain much better per patient. Alfredo MartinezMurray, Vesper Trant A

## 2017-02-20 ENCOUNTER — Inpatient Hospital Stay (HOSPITAL_COMMUNITY): Payer: Medicare Other

## 2017-02-20 ENCOUNTER — Inpatient Hospital Stay (HOSPITAL_COMMUNITY): Payer: Medicare Other | Admitting: Occupational Therapy

## 2017-02-20 ENCOUNTER — Inpatient Hospital Stay (HOSPITAL_COMMUNITY): Payer: Medicare Other | Admitting: Speech Pathology

## 2017-02-20 DIAGNOSIS — I63311 Cerebral infarction due to thrombosis of right middle cerebral artery: Secondary | ICD-10-CM

## 2017-02-20 LAB — URINE CULTURE: CULTURE: NO GROWTH

## 2017-02-20 NOTE — Progress Notes (Signed)
Social Work Patient ID: Gregory BailGeorge Thomas Latu, male   DOB: Jul 19, 1934, 83 y.o.   MRN: [id number]   Brain Honeycutt, Darden DatesJennifer C, LCSW Social Worker Signed   Patient Care Conference Date of Service: 02/20/2017  2:49 PM      Hide copied text Hover for attribution information Inpatient RehabilitationTeam Conference and Plan of Care Update Date: 02/20/2017   Time: 2:30 PM      Patient Name: [name]      Medical Record Number: [id number]  Date of Birth: Jul 19, 1934 Sex: Male         Room/Bed: 4W08C/4W08C-01 Payor Info: Payor: MEDICARE / Plan: MEDICARE PART A AND B / Product Type: *No Product type* /     Admitting Diagnosis: CVA  Admit Date/Time:  02/15/2017  5:15 PM Admission Comments: No comment available    Primary Diagnosis:  CVA (cerebral vascular accident) (HCC) Principal Problem: CVA (cerebral vascular accident) [facility](HCC)       Patient Active Problem List    Diagnosis Date Noted  . Left shoulder pain 02/17/2017  . CVA (cerebral vascular accident) (HCC) 02/15/2017  . BPH (benign prostatic hyperplasia) 05/01/2016  . Paroxysmal atrial fibrillation (HCC) 05/01/2016  . Hyperlipidemia, unspecified 05/01/2016      Expected Discharge Date: Expected Discharge Date: 03/02/17   Team Members Present: Physician leading conference: Dr. Faith RogueZachary Swartz Social Worker Present: Staci AcostaJenny Ilynn Stauffer, LCSW Nurse Present: Other (comment) Moise Boring(Chelsea Evans,RN) PT Present: Aleda GranaVictoria Miller, PT OT Present: Callie FieldingKatie Pittman, OT SLP Present: Feliberto Gottronourtney Payne, SLP PPS Coordinator present : Tora DuckMarie Noel, RN, CRRN       Current Status/Progress Goal Weekly Team Focus  Medical     right mca infarct with left hemiparesis and cognitive deficits. retaining urine  improve cognition  bladder emptying, nutrition, volume mgt   Bowel/Bladder     continent of bowel and bladder requires in and out cath q 4-6 hr   Continent of bowel and bladder regular bladder and bowel pattern  Assist with tolieting needs, encourage double  voiding   Swallow/Nutrition/ Hydration               ADL's     min A overall with functional transfers and self care, L visual inattention  supervision/set up A  self care retraining, transfers, balance, endurance, pt/family edu   Mobility     min to light mod assist overall for transfers, gait, and stairs; decreased functional balance and postural control with posterior bias during sit <> stands  supervision overall  transfers, functional strengthening, endurance, safety with mobility, gait, stairs   Communication     Mod A   Min A  problem solving, recall, attention, awareness    Safety/Cognition/ Behavioral Observations             Pain     pain managed with prn ultram  >4  Assess pain q shift and prn   Skin     Facial bruising, MASD to peri area, sctroum swollen  no new skin issues   Asssist skin q shift and prn     Rehab Goals Patient on target to meet rehab goals: Yes Rehab Goals Revised: none - pt's first conference *See Care Plan and progress notes for long and short-term goals.      Barriers to Discharge   Current Status/Progress Possible Resolutions Date Resolved   Physician     Medical stability        medication adjustments, family ed      Nursing  PT  Decreased caregiver support  recommending 24/7 supervision for safety and increased fall risk  pt lives alone - reports children live near by but not sure if they can provide 24/7 supervision           OT Decreased caregiver support;Incontinence  recommending 24/7 supervision at discharge pt lives alone           SLP Decreased caregiver support;Lack of/limited family support;Behavior pt is restless and impulsive, family members work           SW Lack of/limited family support family has to coordinate 24/7 supervision Pt's brother stated that family is working to make sure pt has 24/7 supervision.  CSW to confirm this with dtr, as well.            Discharge Planning/Teaching Needs:  Pt will go  home with 24/7 family support.  Dtrs can come in for family education prior to pt's discharge.   Team Discussion:  Pt with right MCA infarct and urine retention.  Started on flomax and is starting to empty bladder better.  Has some ongoing cognitive deficits.  Pt is at min A to mod A with transfers/min A self care.  Inattention to left side is barrier.  Pt is pleasant and a Chief Executive Officer.  Loses balance backwards and has a shuffle-like gait.  Has supervision level goals.  Pt's memory is his biggest barrier.  ST is working on functional tasks.  Revisions to Treatment Plan:  none    Continued Need for Acute Rehabilitation Level of Care: The patient requires daily medical management by a physician with specialized training in physical medicine and rehabilitation for the following conditions: Daily direction of a multidisciplinary physical rehabilitation program to ensure safe treatment while eliciting the highest outcome that is of practical value to the patient.: Yes Daily medical management of patient stability for increased activity during participation in an intensive rehabilitation regime.: Yes Daily analysis of laboratory values and/or radiology reports with any subsequent need for medication adjustment of medical intervention for : Cardiac problems;Neurological problems   Audree Schrecengost, Vista Deck 02/20/2017, 2:49 PM

## 2017-02-20 NOTE — Progress Notes (Signed)
RN called for pt with acute onset Left side CP. NP advised to get an EKG and page RRT. On arrival pt lying supine in bed, skin warm and dry, alert and oriented x3. Pt denied CP, SOB, or nausea. Pt stated "It came and went that quick, it may have been gas." No additional interventions from me. EKG completed prior to my arrival.

## 2017-02-20 NOTE — Progress Notes (Signed)
Patient complains of chest pain describes pain as 6 out 10 sharp and consent vital stable no signs of distress on call notified EKG ordered, rapid response called to assess patient be up as soon as possible, EKG done patient report pain gone, on call informed, rapid response up assessed patient stated pain gone to rapid response nurse, still no signs of distress rapid response stated will continue to  monitor

## 2017-02-20 NOTE — Patient Care Conference (Signed)
Inpatient RehabilitationTeam Conference and Plan of Care Update Date: 02/20/2017   Time: 2:30 PM    Patient Name: [name]      Medical Record Number: [id number]  Date of Birth: 02-15-1934 Sex: Male         Room/Bed: 4W08C/4W08C-01 Payor Info: Payor: MEDICARE / Plan: MEDICARE PART A AND B / Product Type: *No Product type* /    Admitting Diagnosis: CVA  Admit Date/Time:  02/15/2017  5:15 PM Admission Comments: No comment available   Primary Diagnosis:  CVA (cerebral vascular accident) (HCC) Principal Problem: CVA (cerebral vascular accident) [facility](HCC)  Patient Active Problem List   Diagnosis Date Noted  . Left shoulder pain 02/17/2017  . CVA (cerebral vascular accident) (HCC) 02/15/2017  . BPH (benign prostatic hyperplasia) 05/01/2016  . Paroxysmal atrial fibrillation (HCC) 05/01/2016  . Hyperlipidemia, unspecified 05/01/2016    Expected Discharge Date: Expected Discharge Date: 03/02/17  Team Members Present: Physician leading conference: Dr. 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 retaining urine  improve cognition  bladder emptying, nutrition, volume mgt   Bowel/Bladder   continent of bowel and bladder requires in and out cath q 4-6 hr   Continent of bowel and bladder regular bladder and bowel pattern  Assist with tolieting needs, encourage double voiding   Swallow/Nutrition/ Hydration             ADL's   min A overall with functional transfers and self care, L visual inattention  supervision/set up A  self care retraining, transfers, balance, endurance, pt/family edu   Mobility   min to light mod assist overall for transfers,  gait, and stairs; decreased functional balance and postural control with posterior bias during sit <> stands  supervision overall  transfers, functional strengthening, endurance, safety with mobility, gait, stairs   Communication   Mod A   Min A  problem solving, recall, attention, awareness    Safety/Cognition/ Behavioral Observations            Pain   pain managed with prn ultram  >4  Assess pain q shift and prn   Skin   Facial bruising, MASD to peri area, sctroum swollen  no new skin issues   Asssist skin q shift and prn    Rehab Goals Patient on target to meet rehab goals: Yes Rehab Goals Revised: none - pt's first conference *See Care Plan and progress notes for long and short-term goals.     Barriers to Discharge  Current Status/Progress Possible Resolutions Date Resolved   Physician    Medical stability        medication adjustments, family ed      Nursing                  PT  Decreased caregiver support  recommending 24/7 supervision for safety and increased fall risk  pt lives alone - reports children live near by but not sure if they can provide 24/7 supervision           OT Decreased caregiver support;Incontinence  recommending 24/7 supervision at discharge pt lives alone           SLP Decreased caregiver support;Lack of/limited family support;Behavior pt  is restless and impulsive, family members work            SW Lack of/limited family support family has to coordinate 24/7 supervision Pt's brother stated that family is working to make sure pt has 24/7 supervision.  CSW to confirm this with dtr, as well.          Discharge Planning/Teaching Needs:  Pt will go home with 24/7 family support.  Dtrs can come in for family education prior to pt's discharge.   Team Discussion:  Pt with right MCA infarct and urine retention.  Started on flomax and is starting to empty bladder better.  Has some ongoing cognitive deficits.  Pt is at min A to mod A with transfers/min A self  care.  Inattention to left side is barrier.  Pt is pleasant and a Chief Executive Officer.  Loses balance backwards and has a shuffle-like gait.  Has supervision level goals.  Pt's memory is his biggest barrier.  ST is working on functional tasks.  Revisions to Treatment Plan:  none    Continued Need for Acute Rehabilitation Level of Care: The patient requires daily medical management by a physician with specialized training in physical medicine and rehabilitation for the following conditions: Daily direction of a multidisciplinary physical rehabilitation program to ensure safe treatment while eliciting the highest outcome that is of practical value to the patient.: Yes Daily medical management of patient stability for increased activity during participation in an intensive rehabilitation regime.: Yes Daily analysis of laboratory values and/or radiology reports with any subsequent need for medication adjustment of medical intervention for : Cardiac problems;Neurological problems  [name], Vista Deck 02/20/2017, 2:49 PM

## 2017-02-20 NOTE — Progress Notes (Signed)
Speech Language Pathology Daily Session Notes  Patient Details  Name: [name] MRN: [id number] Date of Birth: 05/01/1934  Today's Date: 02/20/2017  Session 1: SLP Individual Time: 4098-11910730-0825 SLP Individual Time Calculation (min): 55 min   Session 2: SLP Individual Time: 1030-1100 SLP Individual Time Calculation (min): 30 min  Short Term Goals: Week 1: SLP Short Term Goal 1 (Week 1): Pt will utilize external aids to recall basic, daily information with mod assist multimodal cues.   SLP Short Term Goal 2 (Week 1): Pt will complete basic, familiar tasks with mod assist multimodal cues for functional problem solving.   SLP Short Term Goal 3 (Week 1): Pt will recognize and correct errors during basic, familiar tasks with mod assist multimodal cues.   SLP Short Term Goal 4 (Week 1): Pt will sustain his attention to basic tasks for 5-7 minute intervals with min assist verbal cues for redirection to task.   SLP Short Term Goal 5 (Week 1): Pt will return demonstration of safety precautions with mod assist multimodal cues.    Skilled Therapeutic Interventions:  Session 1: Skilled treatment session focused on cognitive goals. Patient was Mod I for problem solving with tray set-up and alternated attention between self-feeding and functional conversation for ~15 minutes with Mod I. Patient also participated in a basic money management task with Mod A verbal cues for problem solving and working memory throughout the task. SLP also initiated a memory notebook to maximize patient's recall of daily events with Mod A verbal cues needed for organization and recall of morning events. Patient left upright in bed with all needs within reach. Continue with current plan of care.   Session 2: Skilled treatment session focused on cognitive goals. SLP facilitated session by providing supervision verbal cues for problem solving during a functional and familiar check writing task and during a visual scanning  task due to patient reports of difficulty locating items in left visual field. Patient left upright in wheelchair with all needs within reach. Continue with current plan of care.   Function:  Eating Eating   Modified Consistency Diet: No Eating Assist Level: No help, No cues           Cognition Comprehension Comprehension assist level: Understands basic 75 - 89% of the time/ requires cueing 10 - 24% of the time  Expression   Expression assist level: Expresses basic 75 - 89% of the time/requires cueing 10 - 24% of the time. Needs helper to occlude trach/needs to repeat words.  Social Interaction Social Interaction assist level: Interacts appropriately 75 - 89% of the time - Needs redirection for appropriate language or to initiate interaction.  Problem Solving Problem solving assist level: Solves basic 50 - 74% of the time/requires cueing 25 - 49% of the time  Memory Memory assist level: Recognizes or recalls 50 - 74% of the time/requires cueing 25 - 49% of the time    Pain No/Denies Pain   Therapy/Group: Individual Therapy  Undra Trembath 02/20/2017, 2:25 PM

## 2017-02-20 NOTE — Progress Notes (Signed)
Physical Therapy Session Note  Patient Details  Name: [name] MRN: [id number] Date of Birth: [birth year]  Today's Date: 02/20/2017 PT Individual Time: 1345-1415 PT Individual Time Calculation (min): 30 min   Short Term Goals: Week 1:  PT Short Term Goal 1 (Week 1): Pt will consistently perform bed mobility with supervision assist  PT Short Term Goal 2 (Week 1): Pt will perform sit<>stand and stand pivot transfers with min assist consistently.  PT Short Term Goal 3 (Week 1): Pt will ambulate 13850ft with min assist and LRAD  PT Short Term Goal 4 (Week 1): Pt will propell WC 12250ft with supervision assist  PT Short Term Goal 5 (Week 1): Pt will ascend 4 steps with min assist and BUE support   Skilled Therapeutic Interventions/Progress Updates:    Session focused on functional gait with RW with focus on safety, upright posture, and maintaining body positioning in RW to/from therapy gym (x 150') and therex for functional strengthening and balance with 2# ankle weights (including seated LAQ, standing hip abduction, heel/toe raises in standing and mini squats x 10 reps each). Pt demonstrates improved sit <> stands today requiring close supervision to steadying assist though functionally still requires min to almost mod assist for higher level dynamic balance in standing. End of session returned to bed to rest with all needs in reach.   Therapy Documentation Precautions:  Precautions Precautions: Fall Restrictions Weight Bearing Restrictions: No  Pain:  reports some pain in neck but reports its chronic.    See Function Navigator for Current Functional Status.   Therapy/Group: Individual Therapy  [name], [name] [name]  [name] B. Braxdon Gappa, PT, DPT  02/20/2017, 3:23 PM

## 2017-02-20 NOTE — Plan of Care (Signed)
Problem: RH BLADDER ELIMINATION Goal: RH STG MANAGE BLADDER WITH ASSISTANCE STG Manage Bladder With Mod Assistance   Outcome: Not Progressing Increased volumes hs Goal: RH STG MANAGE BLADDER WITH MEDICATION WITH ASSISTANCE STG Manage Bladder With Medication With Mod Assistance.   Outcome: Not Progressing Increased volumes hs Goal: RH STG MANAGE BLADDER WITH EQUIPMENT WITH ASSISTANCE STG Manage Bladder With Equipment With mod Assistance   Outcome: Not Progressing Increased volumes hs

## 2017-02-20 NOTE — Progress Notes (Signed)
Occupational Therapy Session Note  Patient Details  Name: [name] MRN: [id number] Date of Birth: 23-Sep-1933  Today's Date: 02/20/2017 OT Individual Time: 4098-11910832-0945 OT Individual Time Calculation (min): 73 min    Short Term Goals: Week 1:  OT Short Term Goal 1 (Week 1): Pt will demonstrate improved balance to complete sit to stands from toilet with close S. OT Short Term Goal 2 (Week 1): Pt will demonstrate improved coordination to don pants over L foot with extra time.   OT Short Term Goal 3 (Week 1): Pt will demonstrate improved sitting balance of upright posture when sitting on tub bench.  OT Short Term Goal 4 (Week 1): Pt will complete toileting skills with steadying A.  Skilled Therapeutic Interventions/Progress Updates:    Upon entering the room, pt seated in wheelchair awaiting therapist with no c/o pain this session. Pt declined bathing this session. Pt side stepping with RW and min A to dresser to obtain clothing items. Pt needing min verbal cues for safety to sit to don LB clothing. Pt standing at sink to brush teeth with min A for standing balance and pt standing for 6 minutes. Pt seated secondary to fatigue and shaving face with set up A to obtain materials. Pt engaged in word search scanning worksheet with mod multimodal cues for strategies to locate words. Pt required increased time as well but able to find four words this session. Pt remained seated in wheelchair with call bell and all needed items within reach upon exiting the room.   Therapy Documentation Precautions:  Precautions Precautions: Fall Restrictions Weight Bearing Restrictions: No General:   Vital Signs:  Pain: Pain Assessment Pain Assessment: No/denies pain ADL: ADL ADL Comments: see functional navigator  See Function Navigator for Current Functional Status.   Therapy/Group: Individual Therapy  Alen BleacherBradsher, Johnisha Louks P 02/20/2017, 12:36 PM

## 2017-02-20 NOTE — Progress Notes (Signed)
[place] PHYSICAL MEDICINE & REHABILITATION     PROGRESS NOTE    Subjective/Complaints: Had some concerns about scrotum being swollen. Not tender. Emptying bladder a bit better  ROS: pt denies nausea, vomiting, diarrhea, cough, shortness of breath or chest pain    Objective: Vital Signs: Blood pressure (!) 144/77, pulse 68, temperature 99.1 F (37.3 C), temperature source Oral, resp. rate 18, SpO2 98 %. No results found. No results for input(s): WBC, HGB, HCT, PLT in the last 72 hours. No results for input(s): NA, K, CL, GLUCOSE, BUN, CREATININE, CALCIUM in the last 72 hours.  Invalid input(s): CO CBG (last 3)  No results for input(s): GLUCAP in the last 72 hours.  Wt Readings from Last 3 Encounters:  02/13/17 78.9 kg (174 lb)  08/31/16 86.2 kg (190 lb)  08/09/16 86.2 kg (190 lb)    Physical Exam:  HENT:  Head: Normocephalic.  Eyes: Pupils are equal, round, and reactive to light. Conjunctivaeand EOMare normal.  Neck: Normal range of motion. Neck supple. No tracheal deviationpresent. No thyromegalypresent.  Cardiovascular: RRR without murmur. No JVD   Respiratory: CTA Bilaterally without wheezes or rales. Normal effort .  GI: Soft. Bowel sounds are normal. He exhibits no distension. There is no tenderness. There is no rebound GU: mild scrotal edema.  Neurological: He is alert.  Speech is fluent but sometimes meanders and becomes distracted.  Provides name and age as well as follow simple commands. Oriented to place, month, year. Fair sitting balance although leans to left.  Motor 3+ to 4/5 UE's. LE: 4/5 prox to distal. Sensory function grossly intact to LT/pain--stable motorexam Skin: Skin is warmand dry.  Skin. Warm and dry  Assessment/Plan: 1. Functional and cognitive deficits secondary to right MCA infarct which require 3+ hours per day of interdisciplinary therapy in a comprehensive inpatient rehab setting. Physiatrist is providing close team supervision and  24 hour management of active medical problems listed below. Physiatrist and rehab team continue to assess barriers to discharge/monitor patient progress toward functional and medical goals.  Function:  Bathing Bathing position   Position: Shower  Bathing parts Body parts bathed by patient: Right arm, Left arm, Chest, Abdomen, Right upper leg, Left upper leg, Right lower leg, Left lower leg, Front perineal area, Buttocks Body parts bathed by helper: Back  Bathing assist Assist Level: Touching or steadying assistance(Pt > 75%)      Upper Body Dressing/Undressing Upper body dressing   What is the patient wearing?: Pull over shirt/dress     Pull over shirt/dress - Perfomed by patient: Thread/unthread right sleeve, Thread/unthread left sleeve, Put head through opening, Pull shirt over trunk          Upper body assist Assist Level: Set up, Supervision or verbal cues   Set up : To obtain clothing/put away  Lower Body Dressing/Undressing Lower body dressing   What is the patient wearing?: Pants, Shoes, Socks     Pants- Performed by patient: Thread/unthread right pants leg, Thread/unthread left pants leg, Pull pants up/down Pants- Performed by helper: Thread/unthread left pants leg, Fasten/unfasten pants   Non-skid slipper socks- Performed by helper: Don/doff right sock, Don/doff left sock Socks - Performed by patient: Don/doff right sock, Don/doff left sock   Shoes - Performed by patient: Don/doff right shoe, Don/doff left shoe Shoes - Performed by helper: Don/doff right shoe, Don/doff left shoe          Lower body assist Assist for lower body dressing: Touching or steadying assistance (Pt >  75%)      Toileting Toileting   Toileting steps completed by patient: Adjust clothing prior to toileting, Performs perineal hygiene Toileting steps completed by helper: Adjust clothing after toileting Toileting Assistive Devices: Grab bar or rail  Toileting assist Assist level:  Supervision or verbal cues   Transfers Chair/bed transfer   Chair/bed transfer method: Ambulatory, Stand pivot Chair/bed transfer assist level: Touching or steadying assistance (Pt > 75%) Chair/bed transfer assistive device: Armrests, Patent attorney     Max distance: 150' Assist level: Touching or steadying assistance (Pt > 75%)   Wheelchair   Type: Manual Max wheelchair distance: 100 Assist Level: Touching or steadying assistance (Pt > 75%)  Cognition Comprehension Comprehension assist level: Understands basic 75 - 89% of the time/ requires cueing 10 - 24% of the time  Expression Expression assist level: Expresses basic 75 - 89% of the time/requires cueing 10 - 24% of the time. Needs helper to occlude trach/needs to repeat words.  Social Interaction Social Interaction assist level: Interacts appropriately 75 - 89% of the time - Needs redirection for appropriate language or to initiate interaction.  Problem Solving Problem solving assist level: Solves basic 50 - 74% of the time/requires cueing 25 - 49% of the time  Memory Memory assist level: Recognizes or recalls 50 - 74% of the time/requires cueing 25 - 49% of the time  Medical Problem List and Plan: 1. 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 -continue CIR therapies 2. DVT Prophylaxis/Anticoagulation: Eliquis 3. Pain Management: Flexeril and Ultram as needed. Monitor mental status 4. Mood: Zoloft 25 mg daily, Seroquel 12.5 mg daily at bedtime 5. Neuropsych: This patient iscapable of making decisions on hisown behalf. 6. Skin/Wound Care: Routine skin checks 7. Fluids/Electrolytes/Nutrition: Routine I&O with follow-up chemistries reviewed 8.Atrial fibrillation. Cardiac rate controlled at present 9.Escherichia coli UTI. Completed course of Rocephin. Still retaining urine  -re-check urine culture  pending 10.Hyperlipidemia. Lipitor 11.BPH. Pro scar 5 mg daily.    -begin trial of flomax  -up to toilet to void/double void  -voiding/PVR's showing some improvement  LOS (Days) 5 A FACE TO FACE EVALUATION WAS PERFORMED  Ranelle Oyster, MD 02/20/2017 8:47 AM

## 2017-02-21 ENCOUNTER — Inpatient Hospital Stay (HOSPITAL_COMMUNITY): Payer: Medicare Other | Admitting: Physical Therapy

## 2017-02-21 ENCOUNTER — Inpatient Hospital Stay (HOSPITAL_COMMUNITY): Payer: Medicare Other | Admitting: Occupational Therapy

## 2017-02-21 ENCOUNTER — Inpatient Hospital Stay (HOSPITAL_COMMUNITY): Payer: Medicare Other | Admitting: Speech Pathology

## 2017-02-21 NOTE — Progress Notes (Signed)
[facility] PHYSICAL MEDICINE & REHABILITATION     PROGRESS NOTE    Subjective/Complaints: No new issues. Slept well. Denies pain. Still with urine retention  ROS: pt denies nausea, vomiting, diarrhea, cough, shortness of breath or chest pain   Objective: Vital Signs: Blood pressure 139/63, pulse 60, temperature (!) 96.5 F (35.8 C), temperature source Axillary, resp. rate 17, SpO2 97 %. No results found. No results for input(s): WBC, HGB, HCT, PLT in the last 72 hours. No results for input(s): NA, K, CL, GLUCOSE, BUN, CREATININE, CALCIUM in the last 72 hours.  Invalid input(s): CO CBG (last 3)  No results for input(s): GLUCAP in the last 72 hours.  Wt Readings from Last 3 Encounters:  02/13/17 78.9 kg (174 lb)  08/31/16 86.2 kg (190 lb)  08/09/16 86.2 kg (190 lb)    Physical Exam:  HENT:  Head: Normocephalic.  Eyes: Pupils are equal, round, and reactive to light. Conjunctivaeand EOMare normal.  Neck: Normal range of motion. Neck supple. No tracheal deviationpresent. No thyromegalypresent.  Cardiovascular: RRR without murmur. No JVD    Respiratory: CTA Bilaterally without wheezes or rales. Normal effort .  GI: Soft. Bowel sounds are normal. He exhibits no distension. There is no tenderness. There is no rebound GU: mild scrotal edema.  Neurological: He is alert.  Attentive. Follows commands. Memory deficits. Oriented to place, month, year. Fair sitting balance although leans to left.  Motor 3+ to 4/5 UE's. LE: 4/5 prox to distal. Sensory function grossly intact to LT/pain--stable motorexam Skin: Skin is warmand dry.  Skin. Warm and dry  Assessment/Plan: 1. Functional and cognitive deficits secondary to right MCA infarct which require 3+ hours per day of interdisciplinary therapy in a comprehensive inpatient rehab setting. Physiatrist is providing close team supervision and 24 hour management of active medical problems listed below. Physiatrist and rehab team  continue to assess barriers to discharge/monitor patient progress toward functional and medical goals.  Function:  Bathing Bathing position   Position: Shower  Bathing parts Body parts bathed by patient: Right arm, Left arm, Chest, Abdomen, Right upper leg, Left upper leg, Right lower leg, Left lower leg, Front perineal area, Buttocks Body parts bathed by helper: Back  Bathing assist Assist Level: Touching or steadying assistance(Pt > 75%)      Upper Body Dressing/Undressing Upper body dressing   What is the patient wearing?: Pull over shirt/dress     Pull over shirt/dress - Perfomed by patient: Thread/unthread right sleeve, Thread/unthread left sleeve, Put head through opening, Pull shirt over trunk          Upper body assist Assist Level: Supervision or verbal cues   Set up : To obtain clothing/put away  Lower Body Dressing/Undressing Lower body dressing   What is the patient wearing?: Pants, Non-skid slipper socks     Pants- Performed by patient: Thread/unthread right pants leg, Thread/unthread left pants leg, Pull pants up/down Pants- Performed by helper: Thread/unthread left pants leg, Fasten/unfasten pants   Non-skid slipper socks- Performed by helper: Don/doff right sock, Don/doff left sock Socks - Performed by patient: Don/doff right sock, Don/doff left sock   Shoes - Performed by patient: Don/doff right shoe, Don/doff left shoe Shoes - Performed by helper: Don/doff right shoe, Don/doff left shoe          Lower body assist Assist for lower body dressing: Touching or steadying assistance (Pt > 75%)      Toileting Toileting   Toileting steps completed by patient: Performs perineal hygiene, Adjust  clothing prior to toileting, Adjust clothing after toileting Toileting steps completed by helper: Adjust clothing after toileting Toileting Assistive Devices: Grab bar or rail  Toileting assist Assist level: Supervision or verbal cues   Transfers Chair/bed transfer    Chair/bed transfer method: Ambulatory, Stand pivot Chair/bed transfer assist level: Touching or steadying assistance (Pt > 75%) Chair/bed transfer assistive device: Armrests, Patent attorney     Max distance: 150' Assist level: Touching or steadying assistance (Pt > 75%)   Wheelchair   Type: Manual Max wheelchair distance: 100 Assist Level: Touching or steadying assistance (Pt > 75%)  Cognition Comprehension Comprehension assist level: Understands basic 75 - 89% of the time/ requires cueing 10 - 24% of the time  Expression Expression assist level: Expresses basic 75 - 89% of the time/requires cueing 10 - 24% of the time. Needs helper to occlude trach/needs to repeat words.  Social Interaction Social Interaction assist level: Interacts appropriately 75 - 89% of the time - Needs redirection for appropriate language or to initiate interaction.  Problem Solving Problem solving assist level: Solves basic 50 - 74% of the time/requires cueing 25 - 49% of the time  Memory Memory assist level: Recognizes or recalls 50 - 74% of the time/requires cueing 25 - 49% of the time  Medical Problem List and Plan: 1. 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 -continue PT, OT, SLP 2. DVT Prophylaxis/Anticoagulation: Eliquis 3. Pain Management: Flexeril and Ultram as needed. Monitor mental status 4. Mood: Zoloft 25 mg daily, Seroquel 12.5 mg daily at bedtime 5. Neuropsych: This patient iscapable of making decisions on hisown behalf. 6. Skin/Wound Care: Routine skin checks 7. Fluids/Electrolytes/Nutrition: Routine I&O with follow-up chemistries reviewed 8.Atrial fibrillation. Cardiac rate controlled at present 9.Escherichia coli UTI. Completed course of Rocephin. Still retaining urine  -f/u urine culture neg 10.Hyperlipidemia. Lipitor 11.BPH. Pro scar 5 mg daily.    -begin trial of  flomax  -up to toilet to void/double void---continue  -voiding/PVR's showing some improvement overall   LOS (Days) 6 A FACE TO FACE EVALUATION WAS PERFORMED  Ranelle Oyster, MD 02/21/2017 9:10 AM

## 2017-02-21 NOTE — Progress Notes (Signed)
Physical Therapy Session Note  Patient Details  Name: [name] MRN: [id number] Date of Birth: 1934-04-01  Today's Date: 02/21/2017 PT Individual Time: [id number] and [id number] PT Individual Time Calculation (min): 58 min and 43 min  Short Term Goals: Week 1:  PT Short Term Goal 1 (Week 1): Pt will consistently perform bed mobility with supervision assist  PT Short Term Goal 2 (Week 1): Pt will perform sit<>stand and stand pivot transfers with min assist consistently.  PT Short Term Goal 3 (Week 1): Pt will ambulate 17150ft with min assist and LRAD  PT Short Term Goal 4 (Week 1): Pt will propell WC 18150ft with supervision assist  PT Short Term Goal 5 (Week 1): Pt will ascend 4 steps with min assist and BUE support   Skilled Therapeutic Interventions/Progress Updates:  Treatment 1: Pt received in room & agreeable to tx. Pt notes chronic neck pain. Pt ambulates room<>gym with RW & supervision with short step length BLE and decreased hip flexion during swing phase. Pt utilize cybex kinetron in sitting in 1 minute bouts x 3 trials; activity focused on BLE strengthening & NMR. Pt performed taps on 3" step with mod assist for balance. Pt with difficulty consistently locating numbers on L side of step despite cuing. Pt also with impaired coordination LLE. Gait training x 200 ft without AD & min assist with therapist providing min assist for weight shifting R & cuing for increased step length LLE & heel strike BLE. During session pt expressed frustration over inability to write neatly. Therapist provided encouragement and education regarding stroke recovery. At end of session pt returned to room & left in w/c with QRB donned & hot pack applied to R neck/shoulder. Family present in room to supervise & call bell within reach.    Treatment 2: Pt received in room & agreeable to tx. Pt ambulates throughout unit with RW & supervision with cuing to maintain positioning within base of AD and slightly  improved ability to self correct this as session progressed. Pt utilized dynavision while standing with supervision for balance. Pt reports difficulty with locating lights in bottom L quadrant and demonstrates decreased reaction time to both lower quadrants and to L versus R. Pt also completed task a 2nd time with LUE for forced use & L NMR. Pt engaged in pet therapy, petting therapy dog with BUE & standing without UE support with min assist for balance. Pt also retrieved objects from L side of hallway with min cuing to locate items 2/2 L inattention. Pt completed sit<>stand transfers from mat table without BUE support with cuing for technique and to minimize pushing back against mat table; pt required steady assist. At end of session pt left in w/c in room with QRB donned & all needs within reach.  Pt does require total assist for pathfinding gym<>room.   Therapy Documentation Precautions:  Precautions Precautions: Fall Restrictions Weight Bearing Restrictions: No   See Function Navigator for Current Functional Status.   Therapy/Group: Individual Therapy  Sandi MariscalVictoria M Miller 02/21/2017, 2:05 PM

## 2017-02-21 NOTE — Progress Notes (Signed)
Occupational Therapy Session Note  Patient Details  Name: Mickie BailGeorge Thomas Duerst MRN: [id number] Date of Birth: [birth year]  Today's Date: 02/21/2017 OT Individual Time: [id number] OT Individual Time Calculation (min): 58 min    Short Term Goals: Week 1:  OT Short Term Goal 1 (Week 1): Pt will demonstrate improved balance to complete sit to stands from toilet with close S. OT Short Term Goal 2 (Week 1): Pt will demonstrate improved coordination to don pants over L foot with extra time.   OT Short Term Goal 3 (Week 1): Pt will demonstrate improved sitting balance of upright posture when sitting on tub bench.  OT Short Term Goal 4 (Week 1): Pt will complete toileting skills with steadying A.  Skilled Therapeutic Interventions/Progress Updates:    Upon entering the room, pt toileting with NT present in the room. Pt required lifting assistance to stand from standard toilet and steady assistance for balance while performing LB clothing management. Pt seated in wheelchair at sink to wash hands and brush teeth with overall supervision. Pt ambulates with RW and min A for mod multimodal cues for RW advancement into bathroom. Pt required min cues for safety awareness to sit when removing LB clothing. Pt bathing at shower level with increased time and mod cues for sequencing of task. Pt standing to wash buttocks and peri area with steady assistance for balance while holding onto grab bar. Pt donning clothing from wheelchair level with mod cues for clothing orientation. Pt seated in wheelchair with call bell within reach and breakfast tray placed in front of him. Pt required max cues for orientation to time and situation.   Therapy Documentation Precautions:  Precautions Precautions: Fall Restrictions Weight Bearing Restrictions: No General:   Vital Signs: Therapy Vitals Temp: (!) 96.5 F (35.8 C) Temp Source: Axillary Pulse Rate: 60 Resp: 17 BP: 139/63 Patient Position (if appropriate):  Lying Oxygen Therapy SpO2: 97 % O2 Device: Not Delivered Pain:   ADL: ADL ADL Comments: see functional navigator Vision   Perception    Praxis   Exercises:   Other Treatments:    See Function Navigator for Current Functional Status.   Therapy/Group: Individual Therapy  Alen BleacherBradsher, Jazzmen Restivo P 02/21/2017, 8:01 AM

## 2017-02-21 NOTE — Progress Notes (Signed)
Speech Language Pathology Daily Session Note  Patient Details  Name: [name] MRN: [id number] Date of Birth: Dec 17, 1933  Today's Date: 02/21/2017 SLP Individual Time: 0900-0930 SLP Individual Time Calculation (min): 30 min  Short Term Goals: Week 1: SLP Short Term Goal 1 (Week 1): Pt will utilize external aids to recall basic, daily information with mod assist multimodal cues.   SLP Short Term Goal 2 (Week 1): Pt will complete basic, familiar tasks with mod assist multimodal cues for functional problem solving.   SLP Short Term Goal 3 (Week 1): Pt will recognize and correct errors during basic, familiar tasks with mod assist multimodal cues.   SLP Short Term Goal 4 (Week 1): Pt will sustain his attention to basic tasks for 5-7 minute intervals with min assist verbal cues for redirection to task.   SLP Short Term Goal 5 (Week 1): Pt will return demonstration of safety precautions with mod assist multimodal cues.    Skilled Therapeutic Interventions: Skilled treatment session focused on cognitive goals. SLP facilitated session by providing Min A verbal cues for patient to recall events in order to write them down in his memory notebook to facilitate increased short-term memory of daily information. Patient also required Mod A verbal and visual cues to self-monitor and correct errors while writing. Patient left upright in wheelchair with all needs within reach. Continue with current plan of care.      Function:   Cognition Comprehension Comprehension assist level: Understands basic 75 - 89% of the time/ requires cueing 10 - 24% of the time  Expression   Expression assist level: Expresses basic 90% of the time/requires cueing < 10% of the time.  Social Interaction Social Interaction assist level: Interacts appropriately 90% of the time - Needs monitoring or encouragement for participation or interaction.  Problem Solving Problem solving assist level: Solves basic 50 - 74% of the  time/requires cueing 25 - 49% of the time  Memory Memory assist level: Recognizes or recalls 50 - 74% of the time/requires cueing 25 - 49% of the time    Pain No/Denies Pain   Therapy/Group: Individual Therapy  Anarosa Kubisiak 02/21/2017, 9:34 AM

## 2017-02-22 ENCOUNTER — Inpatient Hospital Stay (HOSPITAL_COMMUNITY): Payer: Medicare Other | Admitting: Speech Pathology

## 2017-02-22 ENCOUNTER — Inpatient Hospital Stay (HOSPITAL_COMMUNITY): Payer: Medicare Other | Admitting: Occupational Therapy

## 2017-02-22 ENCOUNTER — Inpatient Hospital Stay (HOSPITAL_COMMUNITY): Payer: Medicare Other | Admitting: Physical Therapy

## 2017-02-22 DIAGNOSIS — G8929 Other chronic pain: Secondary | ICD-10-CM

## 2017-02-22 MED ORDER — TAMSULOSIN HCL 0.4 MG PO CAPS
0.4000 mg | ORAL_CAPSULE | Freq: Two times a day (BID) | ORAL | Status: DC
  Administered 2017-02-22 – 2017-03-02 (×16): 0.4 mg via ORAL
  Filled 2017-02-22 (×16): qty 1

## 2017-02-22 NOTE — Plan of Care (Signed)
Problem: RH BLADDER ELIMINATION Goal: RH STG MANAGE BLADDER WITH ASSISTANCE STG Manage Bladder With Mod Assistance   Outcome: Not Progressing Required I/O cath due to retention  Goal: RH STG MANAGE BLADDER WITH MEDICATION WITH ASSISTANCE STG Manage Bladder With Medication With Mod Assistance.   Outcome: Not Progressing Required I/O cath due to urinary retention

## 2017-02-22 NOTE — Plan of Care (Signed)
Problem: RH BLADDER ELIMINATION Goal: RH STG MANAGE BLADDER WITH EQUIPMENT WITH ASSISTANCE STG Manage Bladder With Equipment With mod Assistance   Outcome: Not Progressing Required intermittent cath for retention

## 2017-02-22 NOTE — Progress Notes (Signed)
Physical Therapy Session Note  Patient Details  Name: [name] Audi MRN: [id number] Date of Birth: 07-14-1934  Today's Date: 02/22/2017 PT Individual Time: 1530-1600 PT Individual Time Calculation (min): 30 min    Skilled Therapeutic Interventions/Progress Updates: Pt received seated in recliner with son present; c/o pain in B shoulders/neck pre-medicated and does not rate. Gait throughout unit with RW and close S; increased cadence with short step length and shuffling pattern. Performed stairs ascent/descent x12 stairs 6" height with B handrails and min guard, self-selected reciprocal gait pattern. Performed ambulatory transfer in ADL apartment with RW on carpeted surface with min cues for technique to side step through tight spaces. Sit <>supine with S and increased time on flat bed. Recliner transfer performed with close S. Performed car transfer with S with cues for maintaining RW in front of body while turning to sit. Gait on ramp and uneven mulch surface with close S and RW. Son present for session to observe; reports family is still uncertain how they will put together 24/7 supervision when pt discharges as recommended by primary therapy team. Pt returned to bed with S. Remained supine with all needs in reach with handoff to NT, RN at end of session.      Therapy Documentation Precautions:  Precautions Precautions: Fall Restrictions Weight Bearing Restrictions: No   See Function Navigator for Current Functional Status.   Therapy/Group: Individual Therapy  [name] 02/22/2017, 3:59 PM

## 2017-02-22 NOTE — Progress Notes (Signed)
Occupational Therapy Session Note  Patient Details  Name: [name] MRN: [id number] Date of Birth: 09/06/1933  Today's Date: 02/22/2017 OT Individual Time: [id number] OT Individual Time Calculation (min): 76 min    Short Term Goals: Week 1:  OT Short Term Goal 1 (Week 1): Pt will demonstrate improved balance to complete sit to stands from toilet with close S. OT Short Term Goal 2 (Week 1): Pt will demonstrate improved coordination to don pants over L foot with extra time.   OT Short Term Goal 3 (Week 1): Pt will demonstrate improved sitting balance of upright posture when sitting on tub bench.  OT Short Term Goal 4 (Week 1): Pt will complete toileting skills with steadying A.  Skilled Therapeutic Interventions/Progress Updates:    Pt received supine in bed agreeable to OT tx session. Focus of session on ADL retraining, functional mobility, dynamic standing, and cognitive remediation. Pt completed supine to sitting EOB with supervision, declining bathing this AM but completes LB dressing with setup assist and minguard assist while standing to pull up pants. Pt completes sit<>stand [facility] with MinA and intermittent minguard assist throughout session. Completed room level functional mobility with MinA to complete toileting. minGuard assist during clothing management. Pt completed standing grooming ADLs at sink with Min steady assist throughout. Min verbal safety cues for RW placement during toilet transfer and standing at sink. Pt ambulates to therapy gym with RW and minguard-minA throughout. Pt participates in standing scanning/matching card activity and seated peg board activity for increased cognitive challenge. Pt completes card activity with minguard in standing, required mod mulitmodal cues for completing peg board activity correctly. Pt returned to room in manner described above with increased verbal cues for safe RW use and avoiding obstacles. Pt left seated in w/c, call bell and  needs within reach, RN present to administer meds.   Therapy Documentation Precautions:  Precautions Precautions: Fall Restrictions Weight Bearing Restrictions: No    Pain: Pain Assessment Pain Assessment: No/denies pain Pain Score: 0-No pain ADL: ADL ADL Comments: see functional navigator  See Function Navigator for Current Functional Status.   Therapy/Group: Individual Therapy  [name] 02/22/2017, 9:34 AM

## 2017-02-22 NOTE — Plan of Care (Signed)
Problem: RH Wheelchair Mobility Goal: LTG Patient will propel w/c in controlled environment (PT) LTG: Patient will propel wheelchair in controlled environment, # of feet with assist (PT)  Outcome: Adequate for Discharge D/c goal - pt ambulatory

## 2017-02-22 NOTE — Progress Notes (Signed)
Physical Therapy Session Note  Patient Details  Name: [name] MRN: [id number] Date of Birth: 20-Sep-1933  Today's Date: 02/22/2017 PT Individual Time: 1012-1106 PT Individual Time Calculation (min): 54 min    Skilled Therapeutic Interventions/Progress Updates:  Pt received in w/c & agreeable to tx. Pt notes continued chronic neck/R shoulder pain. Pt requesting to order lunch and requires assistance to locate today's items on menu. Pt then writes his order down with impaired writing RUE and leaving words out. Pt requires min assist with using phone and ordering correct lunch. Pt ambulates room<>gym with RW & supervision demonstrating short step length BLE, decreased hip flexion and decreased knee extension. Pt requires max cuing for pathfinding room<>gym. In gym pt stood on compliant surface with either 1 UE or no UE support and min assist while engaging in peg board activity. When attempting to stand without UE support pt requires mod assist. Pt also requires min cuing to correctly assemble peg board design, especially in lower left quadrant of peg board. In dayroom pt completed couch transfer, initially losing balance back onto couch on first trial. Therapist provided cuing to scoot to edge of seat, push up with BUE, and for more anterior weight shift and pt able to complete sit>stand from low compliant couch with supervision. At end of session pt left sitting in recliner with BLE elevated, QRB donned & all needs within reach; hot pack applied to R shoulder/neck.  Therapy Documentation Precautions:  Precautions Precautions: Fall Restrictions Weight Bearing Restrictions: No   See Function Navigator for Current Functional Status.   Therapy/Group: Individual Therapy  Sandi MariscalVictoria M Ailany Koren 02/22/2017, 12:22 PM

## 2017-02-22 NOTE — Progress Notes (Signed)
[facility] PHYSICAL MEDICINE & REHABILITATION     PROGRESS NOTE    Subjective/Complaints: No new issues. Slept well. Denies pain. Still with urine retention  ROS: pt denies nausea, vomiting, diarrhea, cough, shortness of breath or chest pain   Objective: Vital Signs: Blood pressure 120/63, pulse 70, temperature 98.1 F (36.7 C), temperature source Oral, resp. rate 17, SpO2 97 %. No results found. No results for input(s): WBC, HGB, HCT, PLT in the last 72 hours. No results for input(s): NA, K, CL, GLUCOSE, BUN, CREATININE, CALCIUM in the last 72 hours.  Invalid input(s): CO CBG (last 3)  No results for input(s): GLUCAP in the last 72 hours.  Wt Readings from Last 3 Encounters:  02/13/17 78.9 kg (174 lb)  08/31/16 86.2 kg (190 lb)  08/09/16 86.2 kg (190 lb)    Physical Exam:  HENT:  Head: Normocephalic.  Eyes: Pupils are equal, round, and reactive to light. Conjunctivaeand EOMare normal.  Neck: Normal range of motion. Neck supple. No tracheal deviationpresent. No thyromegalypresent.  Cardiovascular: RRR without murmur. No JVD    Respiratory: CTA Bilaterally without wheezes or rales. Normal effort .  GI: Soft. Bowel sounds are normal. He exhibits no distension. There is no tenderness. There is no rebound GU: mild scrotal edema.  Neurological: He is alert.  Attentive. Follows commands. Memory deficits. Oriented to place, month, year. Fair sitting balance although leans to left.  Motor 3+ to 4/5 UE's. LE: 4/5 prox to distal. Sensory function grossly intact to LT/pain--stable motorexam Skin: Skin is warmand dry.  Skin. Warm and dry  Assessment/Plan: 1. Functional and cognitive deficits secondary to right MCA infarct which require 3+ hours per day of interdisciplinary therapy in a comprehensive inpatient rehab setting. Physiatrist is providing close team supervision and 24 hour management of active medical problems listed below. Physiatrist and rehab team continue to  assess barriers to discharge/monitor patient progress toward functional and medical goals.  Function:  Bathing Bathing position   Position: Shower  Bathing parts Body parts bathed by patient: Right arm, Left arm, Chest, Abdomen, Right upper leg, Left upper leg, Right lower leg, Left lower leg, Front perineal area, Buttocks Body parts bathed by helper: Back  Bathing assist Assist Level: Touching or steadying assistance(Pt > 75%)      Upper Body Dressing/Undressing Upper body dressing   What is the patient wearing?: Pull over shirt/dress     Pull over shirt/dress - Perfomed by patient: Thread/unthread right sleeve, Thread/unthread left sleeve, Put head through opening, Pull shirt over trunk          Upper body assist Assist Level: Supervision or verbal cues, Set up   Set up : To obtain clothing/put away  Lower Body Dressing/Undressing Lower body dressing   What is the patient wearing?: Pants Underwear - Performed by patient: Thread/unthread right underwear leg, Thread/unthread left underwear leg, Pull underwear up/down   Pants- Performed by patient: Thread/unthread right pants leg, Thread/unthread left pants leg, Pull pants up/down Pants- Performed by helper: Thread/unthread left pants leg, Fasten/unfasten pants Non-skid slipper socks- Performed by patient: Don/doff right sock, Don/doff left sock Non-skid slipper socks- Performed by helper: Don/doff right sock, Don/doff left sock Socks - Performed by patient: Don/doff right sock, Don/doff left sock   Shoes - Performed by patient: Don/doff right shoe, Don/doff left shoe Shoes - Performed by helper: Don/doff right shoe, Don/doff left shoe          Lower body assist Assist for lower body dressing: Touching or steadying  assistance (Pt > 75%)      Toileting Toileting   Toileting steps completed by patient: Performs perineal hygiene, Adjust clothing prior to toileting, Adjust clothing after toileting Toileting steps completed  by helper: Adjust clothing after toileting Toileting Assistive Devices: Grab bar or rail  Toileting assist Assist level: Touching or steadying assistance (Pt.75%)   Transfers Chair/bed transfer   Chair/bed transfer method: Ambulatory Chair/bed transfer assist level: Touching or steadying assistance (Pt > 75%) Chair/bed transfer assistive device: Armrests, Patent attorney     Max distance: 150 ft Assist level: Supervision or verbal cues   Wheelchair   Type: Manual Max wheelchair distance: 100 Assist Level: Touching or steadying assistance (Pt > 75%)  Cognition Comprehension Comprehension assist level: Understands basic 75 - 89% of the time/ requires cueing 10 - 24% of the time  Expression Expression assist level: Expresses basic 75 - 89% of the time/requires cueing 10 - 24% of the time. Needs helper to occlude trach/needs to repeat words.  Social Interaction Social Interaction assist level: Interacts appropriately 75 - 89% of the time - Needs redirection for appropriate language or to initiate interaction.  Problem Solving Problem solving assist level: Solves basic 50 - 74% of the time/requires cueing 25 - 49% of the time  Memory Memory assist level: Recognizes or recalls 50 - 74% of the time/requires cueing 25 - 49% of the time  Medical Problem List and Plan: 1. 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 -continue PT, OT, SLP 2. DVT Prophylaxis/Anticoagulation: Eliquis 3. Pain Management: Flexeril and Ultram as needed. Monitor mental status 4. Mood: Zoloft 25 mg daily, Seroquel 12.5 mg daily at bedtime 5. Neuropsych: This patient iscapable of making decisions on hisown behalf. 6. Skin/Wound Care: Routine skin checks 7. Fluids/Electrolytes/Nutrition: Routine I&O with follow-up chemistries reviewed 8.Atrial fibrillation. Cardiac rate controlled at present 9.Escherichia  coli UTI. Completed course of Rocephin. Still retaining urine  -f/u urine culture neg 10.Hyperlipidemia. Lipitor 11.BPH. Pro scar 5 mg daily.    -increase flomax to bid  -up to toilet to void/double void---continue  -   LOS (Days) 7 A FACE TO FACE EVALUATION WAS PERFORMED  Ranelle Oyster, MD 02/22/2017 8:49 AM

## 2017-02-22 NOTE — Progress Notes (Signed)
Speech Language Pathology Daily Session Note  Patient Details  Name: [name] MRN: [id number] Date of Birth: 1934-03-14  Today's Date: 02/22/2017 SLP Individual Time: [id number] SLP Individual Time Calculation (min): 45 min  Short Term Goals: Week 1: SLP Short Term Goal 1 (Week 1): Pt will utilize external aids to recall basic, daily information with mod assist multimodal cues.   SLP Short Term Goal 2 (Week 1): Pt will complete basic, familiar tasks with mod assist multimodal cues for functional problem solving.   SLP Short Term Goal 3 (Week 1): Pt will recognize and correct errors during basic, familiar tasks with mod assist multimodal cues.   SLP Short Term Goal 4 (Week 1): Pt will sustain his attention to basic tasks for 5-7 minute intervals with min assist verbal cues for redirection to task.   SLP Short Term Goal 5 (Week 1): Pt will return demonstration of safety precautions with mod assist multimodal cues.    Skilled Therapeutic Interventions: Skilled treatment session focused on cognitive goals. SLP facilitated session by providing Mod A verbal cues for patient to recall events from morning therapy session and for utilization of memory notebook to record information in order to maximize overall recall. Patient also participated in a double stimuli visual cancellation task in which patient independently utilized a strategy to reduce his visual field and demonstrated selective attention to task for ~15 minutes in a mildly distracting environment. Patient left upright in wheelchair with all needs within reach. Continue with current plan of care.      Function:  Eating Eating   Modified Consistency Diet: No Eating Assist Level: No help, No cues           Cognition Comprehension Comprehension assist level: Understands basic 75 - 89% of the time/ requires cueing 10 - 24% of the time  Expression   Expression assist level: Expresses basic 75 - 89% of the time/requires  cueing 10 - 24% of the time. Needs helper to occlude trach/needs to repeat words.  Social Interaction Social Interaction assist level: Interacts appropriately 75 - 89% of the time - Needs redirection for appropriate language or to initiate interaction.  Problem Solving Problem solving assist level: Solves basic 50 - 74% of the time/requires cueing 25 - 49% of the time  Memory Memory assist level: Recognizes or recalls 50 - 74% of the time/requires cueing 25 - 49% of the time    Pain No/Denies Pain   Therapy/Group: Individual Therapy  Kayne Yuhas 02/22/2017, 3:21 PM

## 2017-02-22 NOTE — Progress Notes (Signed)
Physical Therapy Weekly Progress Note  Patient Details  Name: [name] MRN: [id number] Date of Birth: 10/20/1933  Beginning of progress report period: February 16, 2017 End of progress report period: February 22, 2017  Today's Date: 02/22/2017  Patient has met 4 of 5 short term goals.  Pt is making good progress towards LTG's. Pt is currently able to ambulate with RW & supervision. Pt continues to demonstrate impaired coordination with LLE and L inattention. Pt also requires assistance for cognition and memory. Pt would benefit from continued skilled PT treatment to focus on coordination, balance, gait, transfers, stair negotiation, safety awareness and for family education prior to pt's d/c.  Patient continues to demonstrate the following deficits muscle weakness, decreased cardiorespiratoy endurance, decreased coordination, decreased visual perceptual skills, decreased attention to left, decreased problem solving and decreased safety awareness, and decreased standing balance, decreased postural control, hemiplegia and decreased balance strategies and therefore will continue to benefit from skilled PT intervention to increase functional independence with mobility.  Patient progressing toward long term goals..  Continue plan of care.  PT Short Term Goals Week 1:  PT Short Term Goal 1 (Week 1): Pt will consistently perform bed mobility with supervision assist  PT Short Term Goal 1 - Progress (Week 1): Progressing toward goal PT Short Term Goal 2 (Week 1): Pt will perform sit<>stand and stand pivot transfers with min assist consistently.  PT Short Term Goal 2 - Progress (Week 1): Met PT Short Term Goal 3 (Week 1):  Pt will ambulate 152f with min assist and LRAD  PT Short Term Goal 3 - Progress (Week 1): Met PT Short Term Goal 4 (Week 1): Pt will propell WC [id number] supervision assist  PT Short Term Goal 4 - Progress (Week 1): Met (pt is at ambulatory level) PT Short Term Goal 5 (Week  1): Pt will ascend 4 steps with min assist and BUE support  PT Short Term Goal 5 - Progress (Week 1): Met Week 2:  PT Short Term Goal 1 (Week 2): STG = LTG due to estimated d/c date.   Therapy Documentation Precautions:  Precautions Precautions: Fall Restrictions Weight Bearing Restrictions: No   See Function Navigator for Current Functional Status.  Therapy/Group: Individual Therapy  ViWaunita Schooner/19/2018, 10:22 AM

## 2017-02-22 NOTE — Progress Notes (Signed)
Social Work Patient ID: [name], male   DOB: 26-Mar-1934, 83 y.o.   MRN: [id number]   CSW met with pt to update him on team conference discussion 02-21-17 and then called and spoke with pt's dtr, Silverio Lay, to update her, as well.  Family is working to arrange schedules to be with pt.  CSW mentioned that the family may want to look into an adult day program, if that would help.  Dtr is aware of d/c date and family will start working on schedule.  CSW will continue to follow and assist as needed.

## 2017-02-23 ENCOUNTER — Inpatient Hospital Stay (HOSPITAL_COMMUNITY): Payer: Medicare Other | Admitting: Speech Pathology

## 2017-02-23 ENCOUNTER — Encounter (HOSPITAL_COMMUNITY): Payer: Self-pay

## 2017-02-23 ENCOUNTER — Inpatient Hospital Stay (HOSPITAL_COMMUNITY): Payer: Medicare Other

## 2017-02-23 ENCOUNTER — Inpatient Hospital Stay (HOSPITAL_COMMUNITY): Payer: Medicare Other | Admitting: *Deleted

## 2017-02-23 DIAGNOSIS — I639 Cerebral infarction, unspecified: Secondary | ICD-10-CM

## 2017-02-23 NOTE — Progress Notes (Signed)
[facility] PHYSICAL MEDICINE & REHABILITATION     PROGRESS NOTE    Subjective/Complaints: Up at sink shaving. Denies any  New issues. Slept well. Still retaining urine  ROS: pt denies nausea, vomiting, diarrhea, cough, shortness of breath or chest pain    Objective: Vital Signs: Blood pressure 137/66, pulse 72, temperature 97.9 F (36.6 C), temperature source Oral, resp. rate 16, height 5\' 11"  (1.803 m), weight 79.2 kg (174 lb 9.7 oz), SpO2 97 %. No results found. No results for input(s): WBC, HGB, HCT, PLT in the last 72 hours. No results for input(s): NA, K, CL, GLUCOSE, BUN, CREATININE, CALCIUM in the last 72 hours.  Invalid input(s): CO CBG (last 3)  No results for input(s): GLUCAP in the last 72 hours.  Wt Readings from Last 3 Encounters:  02/23/17 79.2 kg (174 lb 9.7 oz)  02/13/17 78.9 kg (174 lb)  08/31/16 86.2 kg (190 lb)    Physical Exam:  HENT:  Head: Normocephalic.  Eyes: Pupils are equal, round, and reactive to light. Conjunctivaeand EOMare normal.  Neck: Normal range of motion. Neck supple. No tracheal deviationpresent. No thyromegalypresent.  Cardiovascular: RRR without murmur. No JVD     Respiratory: CTA Bilaterally without wheezes or rales. Normal effort  .  GI: Soft. Bowel sounds are normal. He exhibits no distension. There is no tenderness. There is no rebound GU: mild scrotal edema.  Neurological: He is alert.  More attentive and focus. Speech clearer.  Follows commands. Memory deficits. Oriented to place, month, year. Fair sitting balance although leans to left.  Motor 4/5 UE's. LE: 4 to 4+/5 prox to distal. Sensory function grossly intact to LT/pain--stable motorexam Skin: Skin is warmand dry.  Skin. Warm and dry  Assessment/Plan: 1. Functional and cognitive deficits secondary to right MCA infarct which require 3+ hours per day of interdisciplinary therapy in a comprehensive inpatient rehab setting. Physiatrist is providing close team  supervision and 24 hour management of active medical problems listed below. Physiatrist and rehab team continue to assess barriers to discharge/monitor patient progress toward functional and medical goals.  Function:  Bathing Bathing position Bathing activity did not occur: N/A Position: Shower  Bathing parts Body parts bathed by patient: Right arm, Left arm, Chest, Abdomen, Right upper leg, Left upper leg, Right lower leg, Left lower leg, Front perineal area, Buttocks Body parts bathed by helper: Back  Bathing assist Assist Level: Touching or steadying assistance(Pt > 75%)      Upper Body Dressing/Undressing Upper body dressing   What is the patient wearing?: Pull over shirt/dress     Pull over shirt/dress - Perfomed by patient: Thread/unthread right sleeve, Thread/unthread left sleeve, Put head through opening, Pull shirt over trunk          Upper body assist Assist Level: Supervision or verbal cues, Set up   Set up : To obtain clothing/put away  Lower Body Dressing/Undressing Lower body dressing Lower body dressing/undressing activity did not occur: N/A What is the patient wearing?: Pants Underwear - Performed by patient: Thread/unthread right underwear leg, Thread/unthread left underwear leg, Pull underwear up/down   Pants- Performed by patient: Thread/unthread right pants leg, Thread/unthread left pants leg, Pull pants up/down Pants- Performed by helper: Thread/unthread left pants leg, Fasten/unfasten pants Non-skid slipper socks- Performed by patient: Don/doff right sock, Don/doff left sock Non-skid slipper socks- Performed by helper: Don/doff right sock, Don/doff left sock Socks - Performed by patient: Don/doff right sock, Don/doff left sock   Shoes - Performed by patient: Don/doff  right shoe, Don/doff left shoe Shoes - Performed by helper: Don/doff right shoe, Don/doff left shoe          Lower body assist Assist for lower body dressing: Touching or steadying  assistance (Pt > 75%)      Toileting Toileting Toileting activity did not occur: N/A Toileting steps completed by patient: Adjust clothing prior to toileting, Performs perineal hygiene, Adjust clothing after toileting Toileting steps completed by helper: Adjust clothing after toileting Toileting Assistive Devices: Grab bar or rail  Toileting assist Assist level: Touching or steadying assistance (Pt.75%)   Transfers Chair/bed transfer Chair/bed transfer activity did not occur: Safety/medical concerns Chair/bed transfer method: Ambulatory Chair/bed transfer assist level: Supervision or verbal cues Chair/bed transfer assistive device: Armrests, Patent attorneyWalker     Locomotion Ambulation     Max distance: 175 Assist level: Supervision or verbal cues   Wheelchair   Type: Manual Max wheelchair distance: 100 Assist Level: Touching or steadying assistance (Pt > 75%)  Cognition Comprehension Comprehension assist level: Understands basic 75 - 89% of the time/ requires cueing 10 - 24% of the time  Expression Expression assist level: Expresses basic 75 - 89% of the time/requires cueing 10 - 24% of the time. Needs helper to occlude trach/needs to repeat words.  Social Interaction Social Interaction assist level: Interacts appropriately 75 - 89% of the time - Needs redirection for appropriate language or to initiate interaction.  Problem Solving Problem solving assist level: Solves basic 50 - 74% of the time/requires cueing 25 - 49% of the time  Memory Memory assist level: Recognizes or recalls 50 - 74% of the time/requires cueing 25 - 49% of the time  Medical Problem List and Plan: 1. 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 -continue PT, OT, SLP  -making gradual gains in functional mobility 2. DVT Prophylaxis/Anticoagulation: Eliquis 3. Pain Management: Flexeril and Ultram as needed. Monitor mental status 4.  Mood: Zoloft 25 mg daily, Seroquel 12.5 mg daily at bedtime 5. Neuropsych: This patient iscapable of making decisions on hisown behalf. 6. Skin/Wound Care: Routine skin checks 7. Fluids/Electrolytes/Nutrition: Routine I&O with follow-up chemistries reviewed 8.Atrial fibrillation. Cardiac rate controlled at present 9.Escherichia coli UTI. Completed course of Rocephin.    -f/u urine culture neg 10.Hyperlipidemia. Lipitor 11.BPH. Pro scar 5 mg daily.    -increased flomax to bid  -up to toilet to void/double void---continue  -I/O cath prn. Check pvr's as appropriate  -would like to avoid urecholine given A-fib   LOS (Days) 8 A FACE TO FACE EVALUATION WAS PERFORMED  Ranelle OysterSWARTZ,Shawnay Bramel T, MD 02/23/2017 8:39 AM
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
## 2017-02-23 NOTE — Progress Notes (Signed)
Occupational Therapy Weekly Progress Note  Patient Details  Name: [name] MRN: [id number] Date of Birth: March 15, 1934  Beginning of progress report period: February 16, 2017 End of progress report period: February 23, 2017  Today's Date: 02/23/2017 OT Individual Time: 0700-0800 OT Individual Time Calculation (min): 60 min    Patient has met 4 of 4 short term goals. Pt has made improvements this reporting period in functional transfers improving from up to max A for stand pivot transfers to min A this date. Pt demonstrated improved balance and coordination during ADLs as pt able to thread BLE into pants and stand at sink to complete oral care, hair care and shaving. Pt demo improved arousal and decreased pain over this reporting period improving ability to participate in tx.  Patient continues to demonstrate the following deficits: muscle weakness, decreased cardiorespiratoy endurance, decreased coordination, decreased attention to left and decreased attention, decreased awareness, decreased problem solving, decreased safety awareness and decreased memory and therefore will continue to benefit from skilled OT intervention to enhance overall performance with BADL.  Patient progressing toward long term goals..  Continue plan of care.  OT Short Term Goals Week 1:  OT Short Term Goal 1 (Week 1): Pt will demonstrate improved balance to complete sit to stands from toilet with close S. OT Short Term Goal 1 - Progress (Week 1): Met OT Short Term Goal 2 (Week 1): Pt will demonstrate improved coordination to don pants over L foot with extra time.   OT Short Term Goal 2 - Progress (Week 1): Met OT Short Term Goal 3 (Week 1): Pt will demonstrate improved sitting balance of upright posture when sitting on tub bench.  OT Short Term Goal 3 - Progress (Week 1): Met OT Short Term Goal 4 (Week 1): Pt will complete toileting skills with steadying A. OT Short Term Goal 4 - Progress (Week 1):  Met  Skilled Therapeutic Interventions/Progress Updates:    1:1. Focus of session on sit to stand, cognition, orientation, and safety awareness. Pt ambulates to bathroom with min guard assist to transfer onto toilet. Pt completes clothing management with touching A and sits on toilet with supervision to void bladder. Pt sit to stand from toilet with close supervision and ambulates with RW to TTB with VC for RW management and safety awareness. Pt bathes at sit to stand level with A for washing back and min guard for standing to wash buttocks and peri area. Pt requires min VC for sequencing bathing body parts. Pt dons clothing seated EOB with min guard A for standing when advancing pants past hips. Pt stands at sink to complete oral care and shave with supervision and VC for posture when standing. Pt stand pivot transfer with RW with min guard to return to recliner with QRB donned, breakfast tray set up and call light in reach.   Session 2: Pt with no c/o pain. Pt ambulates with RW to dayroom with CGA and VC for safety awareness and RW management. While standing at high low table pt completes pipe tree activity building figure based off of picture with PVC pipe to improve cognition and LUE FMC required for ADLs. Pt requires MOD question cues for problem solving. While seated pt completes puzzle with mod VC fading to min VC to turn pieces to fit and scanning L to refer to puzzle picture. Pt ambulates back to room same A as mentioned above. Exited session with pt seated in recliner with call light in reach, QRB donned and  all needs met.  Therapy Documentation Precautions:  Precautions Precautions: Fall Restrictions Weight Bearing Restrictions: No Pain:   no c/o pain ADL: ADL ADL Comments: see functional navigator See Function Navigator for Current Functional Status.   Therapy/Group: Individual Therapy  Tonny Branch 02/23/2017, 9:25 AM

## 2017-02-23 NOTE — Progress Notes (Signed)
Speech Language Pathology Weekly Progress and Session Note  Patient Details  Name: Elridge Stemm MRN: [id number] Date of Birth: 1934/05/12  Beginning of progress report period: February 16, 2017 End of progress report period: February 23, 2017  Today's Date: 02/23/2017 SLP Individual Time: 5809-9833 SLP Individual Time Calculation (min): 40 min  Short Term Goals: Week 1: SLP Short Term Goal 1 (Week 1): Pt will utilize external aids to recall basic, daily information with mod assist multimodal cues.   SLP Short Term Goal 1 - Progress (Week 1): Met SLP Short Term Goal 2 (Week 1): Pt will complete basic, familiar tasks with mod assist multimodal cues for functional problem solving.   SLP Short Term Goal 2 - Progress (Week 1): Met SLP Short Term Goal 3 (Week 1): Pt will recognize and correct errors during basic, familiar tasks with mod assist multimodal cues.   SLP Short Term Goal 3 - Progress (Week 1): Met SLP Short Term Goal 4 (Week 1): Pt will sustain his attention to basic tasks for 5-7 minute intervals with min assist verbal cues for redirection to task.   SLP Short Term Goal 4 - Progress (Week 1): Met SLP Short Term Goal 5 (Week 1): Pt will return demonstration of safety precautions with mod assist multimodal cues.   SLP Short Term Goal 5 - Progress (Week 1): Met    New Short Term Goals: Week 2: SLP Short Term Goal 1 (Week 2): Pt will utilize external aids to recall basic, daily information with min assist multimodal cues.   SLP Short Term Goal 2 (Week 2): Pt will recognize and correct errors during basic, familiar tasks with min assist multimodal cues.   SLP Short Term Goal 3 (Week 2): Pt will complete basic, familiar tasks with min assist multimodal cues for functional problem solving.   SLP Short Term Goal 4 (Week 2): Pt will return demonstration of safety precautions with supervision assist multimodal cues.   SLP Short Term Goal 5 (Week 2): Pt will demonstrate selective attention  in a moderately distracting enviornment during basic tasks for 30 minutes with supervision verbal cues for redirection to task.   SLP Short Term Goal 6 (Week 2): Pt will scann/attened to left field of enviornment during functional tasks with supervision verbal cues.   Weekly Progress Updates: Patient has made excellent gains and has met 5 of 5 STG's this reporting period due to improved cognitive functioning. Currently, patient requires overall Mod A verbal cues for functional problem solving and recall of functional information and Min A verbal cues for emergent awareness, attention to left field of environment and attention. Patient and family education is ongoing. Patient would benefit from continued skilled SLP intervention to maximize his cognitive function and overall functional independence prior to discharge.      Intensity: Minumum of 1-2 x/day, 30 to 90 minutes Frequency: 3 to 5 out of 7 days Duration/Length of Stay: 03/02/17 Treatment/Interventions: Cognitive remediation/compensation;Cueing hierarchy;Environmental controls;Internal/external aids;Patient/family education;Therapeutic Activities   Daily Session  Skilled Therapeutic Interventions: Skilled treatment session focused on cognitive goals. SLP facilitated session by providing Mod A verbal cues for recall of his current medications and their functions. Patient independently verbalized routine/strategies he utilized at home for medication management which SLP suspects he can continue to utilize at discharge due to improving cognitive functioning. Patient ambualted to and from the dayroom with RW with supervision verbal cue X 1 needed for safety and Min A verbal cues needed for path finding due to divided attention between  ambulation and conversation. Patient's family present and reported they were currently pleased with both his physical and cognitive gains. Patient left upright in recliner with family present. Continue with current  plan of care.       Function:   Cognition Comprehension Comprehension assist level: Understands basic 75 - 89% of the time/ requires cueing 10 - 24% of the time  Expression   Expression assist level: Expresses basic 75 - 89% of the time/requires cueing 10 - 24% of the time. Needs helper to occlude trach/needs to repeat words.  Social Interaction Social Interaction assist level: Interacts appropriately 75 - 89% of the time - Needs redirection for appropriate language or to initiate interaction.  Problem Solving Problem solving assist level: Solves basic 50 - 74% of the time/requires cueing 25 - 49% of the time  Memory Memory assist level: Recognizes or recalls 50 - 74% of the time/requires cueing 25 - 49% of the time   Pain No/Denies Pain  Therapy/Group: Individual Therapy  Latarra Eagleton 02/23/2017, 11:23 AM

## 2017-02-23 NOTE — Plan of Care (Signed)
Problem: RH BLADDER ELIMINATION Goal: RH STG MANAGE BLADDER WITH ASSISTANCE STG Manage Bladder With Mod Assistance   Outcome: Not Progressing Required I/O cath for urinary retention

## 2017-02-23 NOTE — Plan of Care (Signed)
Problem: RH BOWEL ELIMINATION Goal: RH STG MANAGE BOWEL WITH ASSISTANCE STG Manage Bowel with mod I Assistance.   Outcome: Progressing No bowel issues  Problem: RH BLADDER ELIMINATION Goal: RH STG MANAGE BLADDER WITH ASSISTANCE STG Manage Bladder With Mod Assistance   Outcome: Not Progressing Frequency of urination Goal: RH STG MANAGE BLADDER WITH MEDICATION WITH ASSISTANCE STG Manage Bladder With Medication With Mod Assistance.   Outcome: Progressing On Flomax  Problem: RH SAFETY Goal: RH STG ADHERE TO SAFETY PRECAUTIONS W/ASSISTANCE/DEVICE STG Adhere to Safety Precautions With Min Assistance/Device.   Outcome: Progressing No safety issues noted  Problem: RH PAIN MANAGEMENT Goal: RH STG PAIN MANAGED AT OR BELOW PT'S PAIN GOAL <4 out of 10.   Outcome: Progressing Denies pain

## 2017-02-24 ENCOUNTER — Inpatient Hospital Stay (HOSPITAL_COMMUNITY): Payer: Medicare Other | Admitting: Speech Pathology

## 2017-02-24 ENCOUNTER — Inpatient Hospital Stay (HOSPITAL_COMMUNITY): Payer: Medicare Other | Admitting: Physical Therapy

## 2017-02-24 NOTE — Progress Notes (Signed)
[facility] PHYSICAL MEDICINE & REHABILITATION     PROGRESS NOTE    Subjective/Complaints:   ROS: pt denies nausea, vomiting, diarrhea, cough, shortness of breath or chest pain    Objective: Vital Signs: Blood pressure (!) 155/72, pulse 70, temperature 97.8 F (36.6 C), temperature source Oral, resp. rate 20, height 5\' 11"  (1.803 m), weight 79.2 kg (174 lb 9.7 oz), SpO2 98 %. No results found. No results for input(s): WBC, HGB, HCT, PLT in the last 72 hours. No results for input(s): NA, K, CL, GLUCOSE, BUN, CREATININE, CALCIUM in the last 72 hours.  Invalid input(s): CO CBG (last 3)  No results for input(s): GLUCAP in the last 72 hours.  Wt Readings from Last 3 Encounters:  02/23/17 79.2 kg (174 lb 9.7 oz)  02/13/17 78.9 kg (174 lb)  08/31/16 86.2 kg (190 lb)    Physical Exam:  HENT:  Head: Normocephalic.  Eyes: Pupils are equal, round, and reactive to light. Conjunctivaeand EOMare normal.  Neck: Normal range of motion. Neck supple. No tracheal deviationpresent. No thyromegalypresent.  Cardiovascular: RRR without murmur. No JVD     Respiratory: CTA Bilaterally without wheezes or rales. Normal effort  .  GI: Soft. Bowel sounds are normal. He exhibits no distension. There is no tenderness. There is no rebound GU: mild scrotal edema.  Neurological: He is alert.  More attentive and focus. Speech clearer.  Follows commands. Memory deficits. Oriented to place, month, year. Fair sitting balance although leans to left.  Motor 4/5 UE's. LE: 4 to 4+/5 prox to distal. Sensory function grossly intact to LT/pain--stable motorexam Skin: Skin is warmand dry.  Skin. Warm and dry  Assessment/Plan: 1. Functional and cognitive deficits secondary to right MCA infarct which require 3+ hours per day of interdisciplinary therapy in a comprehensive inpatient rehab setting. Physiatrist is providing close team supervision and 24 hour management of active medical problems listed  below. Physiatrist and rehab team continue to assess barriers to discharge/monitor patient progress toward functional and medical goals.  Function:  Bathing Bathing position Bathing activity did not occur: N/A Position: Shower  Bathing parts Body parts bathed by patient: Right arm, Left arm, Chest, Abdomen, Right upper leg, Left upper leg, Right lower leg, Left lower leg, Front perineal area, Buttocks Body parts bathed by helper: Back  Bathing assist Assist Level: Touching or steadying assistance(Pt > 75%)      Upper Body Dressing/Undressing Upper body dressing   What is the patient wearing?: Pull over shirt/dress     Pull over shirt/dress - Perfomed by patient: Thread/unthread right sleeve, Thread/unthread left sleeve, Put head through opening, Pull shirt over trunk          Upper body assist Assist Level: Supervision or verbal cues, Set up   Set up : To obtain clothing/put away  Lower Body Dressing/Undressing Lower body dressing Lower body dressing/undressing activity did not occur: N/A What is the patient wearing?: Pants Underwear - Performed by patient: Thread/unthread right underwear leg, Thread/unthread left underwear leg, Pull underwear up/down   Pants- Performed by patient: Thread/unthread right pants leg, Thread/unthread left pants leg, Pull pants up/down Pants- Performed by helper: Thread/unthread left pants leg, Fasten/unfasten pants Non-skid slipper socks- Performed by patient: Don/doff right sock, Don/doff left sock Non-skid slipper socks- Performed by helper: Don/doff right sock, Don/doff left sock Socks - Performed by patient: Don/doff right sock, Don/doff left sock   Shoes - Performed by patient: Don/doff right shoe, Don/doff left shoe Shoes - Performed by helper: Don/doff right  shoe, Don/doff left shoe          Lower body assist Assist for lower body dressing: Touching or steadying assistance (Pt > 75%)      Toileting Toileting Toileting activity did  not occur: N/A Toileting steps completed by patient: Adjust clothing prior to toileting, Performs perineal hygiene, Adjust clothing after toileting Toileting steps completed by helper: Adjust clothing after toileting Toileting Assistive Devices: Grab bar or rail  Toileting assist Assist level: Touching or steadying assistance (Pt.75%)   Transfers Chair/bed transfer Chair/bed transfer activity did not occur: Safety/medical concerns Chair/bed transfer method: Ambulatory Chair/bed transfer assist level: Supervision or verbal cues Chair/bed transfer assistive device: Armrests, Patent attorney     Max distance: 200 Assist level: Supervision or verbal cues   Wheelchair   Type: Manual Max wheelchair distance: 100 Assist Level: Touching or steadying assistance (Pt > 75%)  Cognition Comprehension Comprehension assist level: Understands basic 75 - 89% of the time/ requires cueing 10 - 24% of the time  Expression Expression assist level: Expresses basic 75 - 89% of the time/requires cueing 10 - 24% of the time. Needs helper to occlude trach/needs to repeat words.  Social Interaction Social Interaction assist level: Interacts appropriately 75 - 89% of the time - Needs redirection for appropriate language or to initiate interaction.  Problem Solving Problem solving assist level: Solves basic 50 - 74% of the time/requires cueing 25 - 49% of the time  Memory Memory assist level: Recognizes or recalls 50 - 74% of the time/requires cueing 25 - 49% of the time  Medical Problem List and Plan: 1. 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 -continue PT, OT, SLP  -Worked with SLP this morning. 2. DVT Prophylaxis/Anticoagulation: Eliquis 3. Pain Management: Flexeril and Ultram as needed. Monitor mental status 4. Mood: Zoloft 25 mg daily, Seroquel 12.5 mg daily at bedtime 5. Neuropsych: This patient  iscapable of making decisions on hisown behalf. 6. Skin/Wound Care: Routine skin checks 7. Fluids/Electrolytes/Nutrition: Routine I&O with follow-up chemistries reviewed 8.Atrial fibrillation. Cardiac rate controlled at present Vitals:   02/23/17 1329 02/24/17 0400  BP: 135/62 (!) 155/72  Pulse: 74 70  Resp: 16 20  Temp: 97.6 F (36.4 C) 97.8 F (36.6 C)   9.Escherichia coli UTI. Completed course of Rocephin.    -f/u urine culture neg 10.Hyperlipidemia. Lipitor 11.BPH. Pro scar 5 mg daily.    -increased flomax to bid  -up to toilet to void/double void--- PVRs. Slowly improving  -I/O cath prn. Check pvr's as appropriate  -would like to avoid urecholine given A-fib   LOS (Days) 9 A FACE TO FACE EVALUATION WAS PERFORMED  Erick Colace, MD 02/24/2017 11:41 AM

## 2017-02-24 NOTE — Progress Notes (Signed)
Speech Language Pathology Daily Session Note  Patient Details  Name: [name] MRN: [id number] Date of Birth: 03-Feb-1934  Today's Date: 02/24/2017 SLP Individual Time: [id number] SLP Individual Time Calculation (min): 45 min  Short Term Goals: Week 2: SLP Short Term Goal 1 (Week 2): Pt will utilize external aids to recall basic, daily information with min assist multimodal cues.   SLP Short Term Goal 2 (Week 2): Pt will recognize and correct errors during basic, familiar tasks with min assist multimodal cues.   SLP Short Term Goal 3 (Week 2): Pt will complete basic, familiar tasks with min assist multimodal cues for functional problem solving.   SLP Short Term Goal 4 (Week 2): Pt will return demonstration of safety precautions with supervision assist multimodal cues.   SLP Short Term Goal 5 (Week 2): Pt will demonstrate selective attention in a moderately distracting enviornment during basic tasks for 30 minutes with supervision verbal cues for redirection to task.   SLP Short Term Goal 6 (Week 2): Pt will scann/attened to left field of enviornment during functional tasks with supervision verbal cues.   Skilled Therapeutic Interventions: Skilled treatment session focused on cognition goals. SLP facilitated session by providing Min A to navigate newspaper to find specifically requested sections. Pt able to demonstrate selective attention in moderately distracting environment d/t MD visit and ordering lunch. Pt able to scan to left of environment to locate items on table (including his hearing aids). Pt left upright in recliner, safety belt donned and MD visiting him. Continue per current plan of care.      Function:    Cognition Comprehension Comprehension assist level: Understands basic 75 - 89% of the time/ requires cueing 10 - 24% of the time  Expression   Expression assist level: Expresses basic 75 - 89% of the time/requires cueing 10 - 24% of the time. Needs helper to  occlude trach/needs to repeat words.  Social Interaction    Problem Solving Problem solving assist level: Solves basic 50 - 74% of the time/requires cueing 25 - 49% of the time  Memory Memory assist level: Recognizes or recalls 50 - 74% of the time/requires cueing 25 - 49% of the time    Pain Pain Assessment Pain Assessment: No/denies pain  Therapy/Group: Individual Therapy  [name] 02/24/2017, 11:53 AM

## 2017-02-24 NOTE — Progress Notes (Signed)
Physical Therapy Session Note  Patient Details  Name: [name] MRN: [id number] Date of Birth: 24-Aug-1933  Today's Date: 02/24/2017 PT Individual Time: 4619-0122 PT Individual Time Calculation (min): 41 min   Short Term Goals: Week 2:  PT Short Term Goal 1 (Week 2): STG = LTG due to estimated d/c date.  Skilled Therapeutic Interventions/Progress Updates:   Pt received sitting in WC and agreeable to PT  PT instructed pt in gait training through hall of hospital x 150t x 2 with supervision assist and RW. Dynamic gait training also instructed by PT with RW and min-supervision assist from PT with RW. Pt required to weave through 5 cones ambulate over uneven surface x 10 ft and up/down 6 inch curb step for dynamic gait training; all completed x 2.    Dynamic balance training to step on 1 of 2 targets without UE support. Min assist from PT and min cues for improved weight shifting prior to step. X 10 BLE   Modified [place] level B balance exercises instructed by PT. PT provided min assist for safety and improved weight shifting as needed.   Sit<>stand x 5 no UE support.  Forward back ward gait Side stepping. 18f x 2 with    tandem stance 2x 10 sec BLE SLS with 1 UE support 2 x 10 sec BLE.   [facility] too [facility] and all needs met.        Therapy Documentation Precautions:  Precautions Precautions: Fall Restrictions Weight Bearing Restrictions: No   Pain: Pain Assessment Pain Assessment: No/denies pain   See Function Navigator for Current Functional Status.   Therapy/Group: Individual Therapy  ALorie Phenix7/21/2018, 9:30 AM

## 2017-02-25 DIAGNOSIS — I69354 Hemiplegia and hemiparesis following cerebral infarction affecting left non-dominant side: Secondary | ICD-10-CM

## 2017-02-25 NOTE — Progress Notes (Signed)
[facility] PHYSICAL MEDICINE & REHABILITATION     PROGRESS NOTE    Subjective/Complaints:  No issues overnight. Patient without new complaints, except sometimes feels like he sees bugs. ROS: pt denies nausea, vomiting, diarrhea, cough, shortness of breath or chest pain    Objective: Vital Signs: Blood pressure (!) 158/77, pulse 91, temperature 98.3 F (36.8 C), temperature source Oral, resp. rate 17, height 5\' 11"  (1.803 m), weight 79.2 kg (174 lb 9.7 oz), SpO2 99 %. No results found. No results for input(s): WBC, HGB, HCT, PLT in the last 72 hours. No results for input(s): NA, K, CL, GLUCOSE, BUN, CREATININE, CALCIUM in the last 72 hours.  Invalid input(s): CO CBG (last 3)  No results for input(s): GLUCAP in the last 72 hours.  Wt Readings from Last 3 Encounters:  02/23/17 79.2 kg (174 lb 9.7 oz)  02/13/17 78.9 kg (174 lb)  08/31/16 86.2 kg (190 lb)    Physical Exam:  HENT:  Head: Normocephalic.  Eyes: Pupils are equal, round, and reactive to light. Conjunctivaeand EOMare normal.  Neck: Normal range of motion. Neck supple. No tracheal deviationpresent. No thyromegalypresent.  Cardiovascular: RRR without murmur. No JVD     Respiratory: CTA Bilaterally without wheezes or rales. Normal effort  .  GI: Soft. Bowel sounds are normal. He exhibits no distension. There is no tenderness. There is no rebound GU: mild scrotal edema.  Neurological: He is alert.  More attentive and focus. Speech clearer.  Follows commands. Memory deficits. Oriented to place, month, year. Fair sitting balance although leans to left.  Motor 4/5 UE's. LE: 4 to 4+/5 prox to distal. Sensory function grossly intact to LT/pain--stable motorexam Skin: Skin is warmand dry.  Skin. Warm and dry  Assessment/Plan: 1. Functional and cognitive deficits secondary to right MCA infarct which require 3+ hours per day of interdisciplinary therapy in a comprehensive inpatient rehab setting. Physiatrist is  providing close team supervision and 24 hour management of active medical problems listed below. Physiatrist and rehab team continue to assess barriers to discharge/monitor patient progress toward functional and medical goals.  Function:  Bathing Bathing position Bathing activity did not occur: N/A Position: Shower  Bathing parts Body parts bathed by patient: Right arm, Left arm, Chest, Abdomen, Right upper leg, Left upper leg, Right lower leg, Left lower leg, Front perineal area, Buttocks Body parts bathed by helper: Back  Bathing assist Assist Level: Touching or steadying assistance(Pt > 75%)      Upper Body Dressing/Undressing Upper body dressing   What is the patient wearing?: Pull over shirt/dress     Pull over shirt/dress - Perfomed by patient: Thread/unthread right sleeve, Thread/unthread left sleeve, Put head through opening, Pull shirt over trunk          Upper body assist Assist Level: Supervision or verbal cues, Set up   Set up : To obtain clothing/put away  Lower Body Dressing/Undressing Lower body dressing Lower body dressing/undressing activity did not occur: N/A What is the patient wearing?: Pants Underwear - Performed by patient: Thread/unthread right underwear leg, Thread/unthread left underwear leg, Pull underwear up/down   Pants- Performed by patient: Thread/unthread right pants leg, Thread/unthread left pants leg, Pull pants up/down Pants- Performed by helper: Thread/unthread left pants leg, Fasten/unfasten pants Non-skid slipper socks- Performed by patient: Don/doff right sock, Don/doff left sock Non-skid slipper socks- Performed by helper: Don/doff right sock, Don/doff left sock Socks - Performed by patient: Don/doff right sock, Don/doff left sock   Shoes - Performed by patient:  Don/doff right shoe, Don/doff left shoe Shoes - Performed by helper: Don/doff right shoe, Don/doff left shoe          Lower body assist Assist for lower body dressing: Touching  or steadying assistance (Pt > 75%)      Toileting Toileting Toileting activity did not occur: N/A Toileting steps completed by patient: Adjust clothing prior to toileting, Performs perineal hygiene, Adjust clothing after toileting Toileting steps completed by helper: Adjust clothing after toileting Toileting Assistive Devices: Grab bar or rail  Toileting assist Assist level: Touching or steadying assistance (Pt.75%)   Transfers Chair/bed transfer Chair/bed transfer activity did not occur: Safety/medical concerns Chair/bed transfer method: Stand pivot Chair/bed transfer assist level: Supervision or verbal cues Chair/bed transfer assistive device: Armrests, Patent attorney     Max distance: 144ft  Assist level: Supervision or verbal cues   Wheelchair   Type: Manual Max wheelchair distance: 100 Assist Level: Touching or steadying assistance (Pt > 75%)  Cognition Comprehension Comprehension assist level: Understands basic 75 - 89% of the time/ requires cueing 10 - 24% of the time  Expression Expression assist level: Expresses basic 75 - 89% of the time/requires cueing 10 - 24% of the time. Needs helper to occlude trach/needs to repeat words.  Social Interaction Social Interaction assist level: Interacts appropriately 75 - 89% of the time - Needs redirection for appropriate language or to initiate interaction.  Problem Solving Problem solving assist level: Solves basic 50 - 74% of the time/requires cueing 25 - 49% of the time  Memory Memory assist level: Recognizes or recalls 50 - 74% of the time/requires cueing 25 - 49% of the time  Medical Problem List and Plan: 1. 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 -continue PT, OT, SLP   2. DVT Prophylaxis/Anticoagulation: Eliquis 3. Pain Management: Flexeril and Ultram as needed. Monitor mental status 4. Mood: Zoloft 25 mg daily,  Seroquel 12.5 mg daily at bedtime 5. Neuropsych: This patient iscapable of making decisions on hisown behalf. 6. Skin/Wound Care: Routine skin checks 7. Fluids/Electrolytes/Nutrition: Routine I&O with follow-up chemistries reviewed 8.Atrial fibrillation. Cardiac rate controlled at present Vitals:   02/24/17 1404 02/25/17 0441  BP: 126/67 (!) 158/77  Pulse: 73 91  Resp: 18 17  Temp: 97.9 F (36.6 C) 98.3 F (36.8 C)   9.Escherichia coli UTI. Completed course of Rocephin.    -f/u urine culture neg 10.Hyperlipidemia. Lipitor 11.BPH. Pro scar 5 mg daily.    -increased flomax to bid  -up to toilet to void/double void--- PVRs. Slowly improving  -I/O cath prn. Check pvr's as appropriate  -would like to avoid urecholine given A-fib 12. Visual illusions. We discussed that right MCA stroke may cause misperception of visual stimuli, this will likely improve time and does not require any specific treatment LOS (Days) 10 A FACE TO FACE EVALUATION WAS PERFORMED  Erick Colace, MD 02/25/2017 10:48 AM

## 2017-02-26 ENCOUNTER — Inpatient Hospital Stay (HOSPITAL_COMMUNITY): Payer: Medicare Other | Admitting: Occupational Therapy

## 2017-02-26 ENCOUNTER — Inpatient Hospital Stay (HOSPITAL_COMMUNITY): Payer: Medicare Other | Admitting: Speech Pathology

## 2017-02-26 ENCOUNTER — Inpatient Hospital Stay (HOSPITAL_COMMUNITY): Payer: Medicare Other | Admitting: Physical Therapy

## 2017-02-26 NOTE — Progress Notes (Signed)
Occupational Therapy Session Note  Patient Details  Name: [name] MRN: [id number] Date of Birth: 1933-11-26  Today's Date: 02/26/2017 OT Individual Time: 1245-1340 OT Individual Time Calculation (min): 55 min    Skilled Therapeutic Interventions/Progress Updates: Patient completed functional mobility walker level room to/fr gym and tended to look down toward floor at feet and stated that if he looks ahead or further away from feet, his shoulders hurt.  Session focused on left visual attention, dynamic seated and squatted balance and L LE weight bearing and L hand coordination and bilateral activities.    He tended to have difficulty visually perceptuallly  distinguishing between the black clubs and spades on the cards when the cards were placed in his left visual environment closer or further than an arm's length away.    At session's end he was left seated in his w/c with safety belt and call bell and phone in place.     Therapy Documentation Precautions:  Precautions Precautions: Fall Restrictions Weight Bearing Restrictions: No    Pain:denied    Perception  L inattention  See Function Navigator for Current Functional Status.   Therapy/Group: Individual Therapy  Bud Faceickett, Braydon Kullman [facility] 02/26/2017, 2:01 PM

## 2017-02-26 NOTE — Progress Notes (Signed)
[place] PHYSICAL MEDICINE & REHABILITATION     PROGRESS NOTE    Subjective/Complaints:  Had a mild headache over night. "knot" over eye a little sore. Still some retention  ROS: pt denies nausea, vomiting, diarrhea, cough, shortness of breath or chest pain     Objective: Vital Signs: Blood pressure 122/67, pulse 82, temperature 97.9 F (36.6 C), temperature source Oral, resp. rate 18, height 5\' 11"  (1.803 m), weight 79.2 kg (174 lb 9.7 oz), SpO2 97 %. No results found. No results for input(s): WBC, HGB, HCT, PLT in the last 72 hours. No results for input(s): NA, K, CL, GLUCOSE, BUN, CREATININE, CALCIUM in the last 72 hours.  Invalid input(s): CO CBG (last 3)  No results for input(s): GLUCAP in the last 72 hours.  Wt Readings from Last 3 Encounters:  02/23/17 79.2 kg (174 lb 9.7 oz)  02/13/17 78.9 kg (174 lb)  08/31/16 86.2 kg (190 lb)    Physical Exam:  HENT:  Head: Normocephalic.  Eyes: Pupils are equal, round, and reactive to light. Conjunctivaeand EOMare normal.  Neck: Normal range of motion. Neck supple. No tracheal deviationpresent. No thyromegalypresent.  Cardiovascular: RRR without murmur. No JVD      Respiratory: CTA Bilaterally without wheezes or rales. Normal effort.  GI: Soft. Bowel sounds are normal. He exhibits no distension. There is no tenderness. There is no rebound GU: mild scrotal edema.  Neurological: He is alert.  More attentive. Remembers my name. .  Follows commands. Memory deficits. Oriented to place, month, year. Fair sitting balance although leans to left.  Motor 4/5 UE's. LE: 4 to 4+/5 prox to distal. Sensory function grossly intact to LT/pain--stable Skin: Skin is warmand dry.  Skin. Warm and dry  Assessment/Plan: 1. Functional and cognitive deficits secondary to right MCA infarct which require 3+ hours per day of interdisciplinary therapy in a comprehensive inpatient rehab setting. Physiatrist is providing close team supervision and  24 hour management of active medical problems listed below. Physiatrist and rehab team continue to assess barriers to discharge/monitor patient progress toward functional and medical goals.  Function:  Bathing Bathing position Bathing activity did not occur: N/A Position: Shower  Bathing parts Body parts bathed by patient: Right arm, Left arm, Chest, Abdomen, Right upper leg, Left upper leg, Right lower leg, Left lower leg, Front perineal area, Buttocks Body parts bathed by helper: Back  Bathing assist Assist Level: Touching or steadying assistance(Pt > 75%)      Upper Body Dressing/Undressing Upper body dressing   What is the patient wearing?: Pull over shirt/dress     Pull over shirt/dress - Perfomed by patient: Thread/unthread right sleeve, Thread/unthread left sleeve, Put head through opening, Pull shirt over trunk          Upper body assist Assist Level: Supervision or verbal cues, Set up   Set up : To obtain clothing/put away  Lower Body Dressing/Undressing Lower body dressing Lower body dressing/undressing activity did not occur: N/A What is the patient wearing?: Pants Underwear - Performed by patient: Thread/unthread right underwear leg, Thread/unthread left underwear leg, Pull underwear up/down   Pants- Performed by patient: Thread/unthread right pants leg, Thread/unthread left pants leg, Pull pants up/down Pants- Performed by helper: Thread/unthread left pants leg, Fasten/unfasten pants Non-skid slipper socks- Performed by patient: Don/doff right sock, Don/doff left sock Non-skid slipper socks- Performed by helper: Don/doff right sock, Don/doff left sock Socks - Performed by patient: Don/doff right sock, Don/doff left sock   Shoes - Performed by patient:  Don/doff right shoe, Don/doff left shoe Shoes - Performed by helper: Don/doff right shoe, Don/doff left shoe          Lower body assist Assist for lower body dressing: Touching or steadying assistance (Pt > 75%)       Toileting Toileting Toileting activity did not occur: N/A Toileting steps completed by patient: Adjust clothing prior to toileting, Performs perineal hygiene, Adjust clothing after toileting Toileting steps completed by helper: Adjust clothing after toileting Toileting Assistive Devices: Grab bar or rail  Toileting assist Assist level: Touching or steadying assistance (Pt.75%)   Transfers Chair/bed transfer Chair/bed transfer activity did not occur: Safety/medical concerns Chair/bed transfer method: Stand pivot Chair/bed transfer assist level: Supervision or verbal cues Chair/bed transfer assistive device: Armrests, Patent attorneyWalker     Locomotion Ambulation     Max distance: 1450ft  Assist level: Supervision or verbal cues   Wheelchair   Type: Manual Max wheelchair distance: 100 Assist Level: Touching or steadying assistance (Pt > 75%)  Cognition Comprehension Comprehension assist level: Understands basic 75 - 89% of the time/ requires cueing 10 - 24% of the time  Expression Expression assist level: Expresses basic 75 - 89% of the time/requires cueing 10 - 24% of the time. Needs helper to occlude trach/needs to repeat words.  Social Interaction Social Interaction assist level: Interacts appropriately 75 - 89% of the time - Needs redirection for appropriate language or to initiate interaction.  Problem Solving Problem solving assist level: Solves basic 50 - 74% of the time/requires cueing 25 - 49% of the time  Memory Memory assist level: Recognizes or recalls 50 - 74% of the time/requires cueing 25 - 49% of the time  Medical Problem List and Plan: 1. 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 -continue PT, OT, SLP  -making gains toward goals.  2. DVT Prophylaxis/Anticoagulation: Eliquis 3. Pain Management: Flexeril and Ultram as needed. Monitor mental status 4. Mood: Zoloft 25 mg daily, Seroquel  12.5 mg daily at bedtime 5. Neuropsych: This patient iscapable of making decisions on hisown behalf. 6. Skin/Wound Care: Routine skin checks 7. Fluids/Electrolytes/Nutrition: Routine I&O with follow-up chemistries reviewed 8.Atrial fibrillation. Cardiac rate controlled at present Vitals:   02/25/17 1343 02/26/17 0451  BP: 119/62 122/67  Pulse: 84 82  Resp: 18 18  Temp: 98 F (36.7 C) 97.9 F (36.6 C)   9.Escherichia coli UTI. Completed course of Rocephin.    -f/u urine culture neg 10.Hyperlipidemia. Lipitor 11.BPH. Pro scar 5 mg daily.    -increased flomax to bid  -up to toilet to void/double void--- PVRs showing some improvement  -I/O cath prn. Check pvr's as appropriate  -would like to avoid urecholine given A-fib    LOS (Days) 11 A FACE TO FACE EVALUATION WAS PERFORMED  Ranelle OysterSWARTZ,Kaleigha Chamberlin T, MD 02/26/2017 8:39 AM

## 2017-02-26 NOTE — Progress Notes (Signed)
Physical Therapy Session Note  Patient Details  Name: [name] MRN: [id number] Date of Birth: Apr 02, 1934  Today's Date: 02/26/2017 PT Individual Time: 1130-1201 and 2440-10271504-1530 PT Individual Time Calculation (min): 31 min and 26 min  Short Term Goals: Week 2:  PT Short Term Goal 1 (Week 2): STG = LTG due to estimated d/c date.  Skilled Therapeutic Interventions/Progress Updates:  Treatment 1: Pt received in room with daughter Gregory Duster(Michelle) present for session. Session focused on pt/family education and ambulation over even/uneven surfaces. Pt ambulated unit<>outside [facility] entrance with RW & supervision over even surfaces and steady assist over uneven surfaces. Pt continues to require cuing to ambulate within base of RW, increase step length, and decreased gait speed. Pt with impaired safety awareness as he reports he does not see the purpose of walking slower. Also educated pt to keep RW on floor during turns. Educated pt's daughter on pt's L inattention, decreased safety awareness, and need for cuing at all times during functional mobility. Pt's daughter very appreciative of information. Pt requires mod cuing to path find unit<>outside and close supervision/steady assist to negotiate uneven surface to enter/exit elevator. At end of session pt left sitting in w/c in room with daughter present to supervise & RN aware.   Treatment 2: Pt received in room & agreeable to tx. Pt reports continued chronic shoulder/neck pain & hot pack applied at end of session. Pt ambulates room<>gym with RW & supervision with cuing for increased step length & decreased gait speed with good return demo but poor carryover of information as he requires cuing more than once. Pt negotiates 12 steps with B rails & supervision with reciprocal pattern. Pt utilized [employer] Limits of Stability with BUE support and max fading to min assist for weight shifting in all directions. At rest pt bears weight through B heels. As task  progresses pt with improved ability to weight shift in all directions, but with most difficulty anteriorly. Pt requires min cuing for pathfinding gym>room on this date and cuing to square up to seated surface before transferring stand>sit. At end of session pt left in w/c in room with QRB donned & all needs within reach.  Therapy Documentation Precautions:  Precautions Precautions: Fall Restrictions Weight Bearing Restrictions: No   See Function Navigator for Current Functional Status.   Therapy/Group: Individual Therapy  [name] 02/26/2017, 3:36 PM

## 2017-02-26 NOTE — Progress Notes (Signed)
Occupational Therapy Session Note  Patient Details  Name: [name] MRN: [id number] Date of Birth: 1934-05-19  Today's Date: 02/26/2017 OT Individual Time: 1001-1059 OT Individual Time Calculation (min): 58 min    Short Term Goals: Week 2:  OT Short Term Goal 1 (Week 2): STG=LTG at supervision level 2/2 ELOS   Skilled Therapeutic Interventions/Progress Updates:    Tx focus on left visual field scanning, cognitive remediation, and higher level balance challenges during leisure occupational engagement.   Pt greeted in w/c. Daughter [name] present. Declining B/D. Mod cues for interpreting therapy schedule and recall of previous therapies/therapists from this AM. Pt then ambulated to dayroom with RW and supervision. Pt engaging in Scrabble game while standing on blue foam cushion. Pt requiring mod-max multimodal cues for problem solving, memory (of discussed instructions/turn taking), and sequencing during this new learning task. Pt actively engaged, spelling words, counting tiles, verbalizing aloud when he made mistakes and tried to fix them. Pt standing >30 minutes without rest and min guard (due to slight shakiness during initial stand). Pt then ambulated back to room in manner as written above. Left with all needs within reach and daughter present, anticipating next therapist.    Daughter educated on using board games for cognitive remediation at home. She verbalized that she plans to purchase games for pt to play with grandchildren at d/c for this purpose.   Therapy Documentation Precautions:  Precautions Precautions: Fall Restrictions Weight Bearing Restrictions: No Pain: No c/o pain during tx    ADL: ADL ADL Comments: see functional navigator     See Function Navigator for Current Functional Status.   Therapy/Group: Individual Therapy  Brennyn Haisley A Gregory Cassel 02/26/2017, 12:37 PM

## 2017-02-26 NOTE — Progress Notes (Signed)
Patient had red area above R eyebrow from prior bruising first noted by this RN on Saturday 7/21 morning. New knot noted on overnight shift 7/22-23. Yesterday (7/22) patient knocked head during shower per patient and NT during morning shower. In the afternoon, NT reported to RN that head looked red. RN assessed but didn't see difference from baseline. Swelling increased overnight. Night RN gave pain med.   On assessment this morning, neuro checks unchanged, PA notified. Patient's family mentioned he saw ants on the wall that were not there. Upon review of notes, patient has been seeing bugs, especially overnight, as baseline. Family unconcerned.

## 2017-02-26 NOTE — Progress Notes (Signed)
Speech Language Pathology Daily Session Note  Patient Details  Name: [name] MRN: [id number] Date of Birth: 04-Apr-1934  Today's Date: 02/26/2017 SLP Individual Time: 1100-1130 SLP Individual Time Calculation (min): 30 min  Short Term Goals: Week 2: SLP Short Term Goal 1 (Week 2): Pt will utilize external aids to recall basic, daily information with min assist multimodal cues.   SLP Short Term Goal 2 (Week 2): Pt will recognize and correct errors during basic, familiar tasks with min assist multimodal cues.   SLP Short Term Goal 3 (Week 2): Pt will complete basic, familiar tasks with min assist multimodal cues for functional problem solving.   SLP Short Term Goal 4 (Week 2): Pt will return demonstration of safety precautions with supervision assist multimodal cues.   SLP Short Term Goal 5 (Week 2): Pt will demonstrate selective attention in a moderately distracting enviornment during basic tasks for 30 minutes with supervision verbal cues for redirection to task.   SLP Short Term Goal 6 (Week 2): Pt will scann/attened to left field of enviornment during functional tasks with supervision verbal cues.   Skilled Therapeutic Interventions: Skilled treatment session focused on cognition goals. SLP facilitated session by providing Min A for completion of board game (Checkers). Pt able to self-monitor and self-correct moves. Pt's daughter present and education provided on need for 24 hour supervision. Daughter voiced understanding and all questions answered to daughter's satisfaction. Pt returned to room, left upright in wheelchair with daughter present.      Function:  Eating Eating   Modified Consistency Diet: No Eating Assist Level: No help, No cues           Cognition Comprehension Comprehension assist level: Understands basic 75 - 89% of the time/ requires cueing 10 - 24% of the time  Expression   Expression assist level: Expresses basic 75 - 89% of the time/requires  cueing 10 - 24% of the time. Needs helper to occlude trach/needs to repeat words.  Social Interaction Social Interaction assist level: Interacts appropriately 75 - 89% of the time - Needs redirection for appropriate language or to initiate interaction.  Problem Solving Problem solving assist level: Solves basic 50 - 74% of the time/requires cueing 25 - 49% of the time  Memory Memory assist level: Recognizes or recalls 50 - 74% of the time/requires cueing 25 - 49% of the time    Pain    Therapy/Group: Individual Therapy  Jora Galluzzo 02/26/2017, 3:41 PM

## 2017-02-27 ENCOUNTER — Inpatient Hospital Stay (HOSPITAL_COMMUNITY): Payer: Medicare Other | Admitting: Occupational Therapy

## 2017-02-27 ENCOUNTER — Inpatient Hospital Stay (HOSPITAL_COMMUNITY): Payer: Medicare Other | Admitting: Speech Pathology

## 2017-02-27 ENCOUNTER — Inpatient Hospital Stay (HOSPITAL_COMMUNITY): Payer: Medicare Other | Admitting: Physical Therapy

## 2017-02-27 LAB — CBC
HEMATOCRIT: 37.8 % — AB (ref 39.0–52.0)
Hemoglobin: 12.5 g/dL — ABNORMAL LOW (ref 13.0–17.0)
MCH: 28.5 pg (ref 26.0–34.0)
MCHC: 33.1 g/dL (ref 30.0–36.0)
MCV: 86.1 fL (ref 78.0–100.0)
PLATELETS: 379 10*3/uL (ref 150–400)
RBC: 4.39 MIL/uL (ref 4.22–5.81)
RDW: 15.2 % (ref 11.5–15.5)
WBC: 6.8 10*3/uL (ref 4.0–10.5)

## 2017-02-27 MED ORDER — BETHANECHOL CHLORIDE 10 MG PO TABS
10.0000 mg | ORAL_TABLET | Freq: Three times a day (TID) | ORAL | Status: DC
  Administered 2017-02-27 – 2017-03-02 (×10): 10 mg via ORAL
  Filled 2017-02-27 (×10): qty 1

## 2017-02-27 NOTE — Progress Notes (Signed)
Physical Therapy Session Note  Patient Details  Name: [name] MRN: [id number] Date of Birth: [birth year]  Today's Date: 02/27/2017 PT Individual Time: 0911-1008 PT Individual Time Calculation (min): 57 min   Short Term Goals: Week 2:  PT Short Term Goal 1 (Week 2): STG = LTG due to estimated d/c date.  Skilled Therapeutic Interventions/Progress Updates:  Pt received in bed & agreeable to tx. Pt transferred supine>sitting EOB with use of bed rails & HOB elevated with mod I. Pt donned pants with assistance to obtain clothing and distant supervision for standing balance. Pt ambulates room<>gym with min cuing for pathfinding. Pt able to recall need to ambulate within base of RW and demonstrate increased step length with min cuing from therapist, but does require cuing for carryover of information throughout session. In gym pt engaged in n-1 and n-2 card recall; pt with great difficulty with n-2 recall with only 25% accuracy. Pt ambulated throughout obstacle course (stepping over poles, weaving between cones, and laterally walking through cones) with max cuing for technique (foot approximation to poles and side stepping with RW) and attention to objets on L. Pt would benefit from continued practice with negotiating obstacles. At end of session pt left sitting in recliner in room with all needs within reach & QRB donned.  Therapy Documentation Precautions:  Precautions Precautions: Fall Restrictions Weight Bearing Restrictions: No  Pain: No c/o pain reported.   See Function Navigator for Current Functional Status.   Therapy/Group: Individual Therapy  Sandi MariscalVictoria M Zamiyah [name] 02/27/2017, 12:32 PM

## 2017-02-27 NOTE — Progress Notes (Signed)
Speech Language Pathology Daily Session Note  Patient Details  Name: [name] MRN: [id number] Date of Birth: Jul 05, 1934  Today's Date: 02/27/2017 SLP Individual Time: 0730-0800 SLP Individual Time Calculation (min): 30 min  Short Term Goals: Week 2: SLP Short Term Goal 1 (Week 2): Pt will utilize external aids to recall basic, daily information with min assist multimodal cues.   SLP Short Term Goal 2 (Week 2): Pt will recognize and correct errors during basic, familiar tasks with min assist multimodal cues.   SLP Short Term Goal 3 (Week 2): Pt will complete basic, familiar tasks with min assist multimodal cues for functional problem solving.   SLP Short Term Goal 4 (Week 2): Pt will return demonstration of safety precautions with supervision assist multimodal cues.   SLP Short Term Goal 5 (Week 2): Pt will demonstrate selective attention in a moderately distracting enviornment during basic tasks for 30 minutes with supervision verbal cues for redirection to task.   SLP Short Term Goal 6 (Week 2): Pt will scann/attened to left field of enviornment during functional tasks with supervision verbal cues.   Skilled Therapeutic Interventions: Skilled treatment session focused on cognition goals. SLP facilitated session by providing supervision cues for selective attention in mildly distracting environment, scanning to left during sequencing task and pt was at supervision with self-monitoring and correcting errors within sequence task. Pt left in bed with all needs within reach. Continue per current plan of care. Pt with good progress over all reporting period.      Function:    Cognition Comprehension Comprehension assist level: Understands basic 75 - 89% of the time/ requires cueing 10 - 24% of the time  Expression   Expression assist level: Expresses basic 75 - 89% of the time/requires cueing 10 - 24% of the time. Needs helper to occlude trach/needs to repeat words.  Social  Interaction Social Interaction assist level: Interacts appropriately 75 - 89% of the time - Needs redirection for appropriate language or to initiate interaction.  Problem Solving Problem solving assist level: Solves basic 50 - 74% of the time/requires cueing 25 - 49% of the time  Memory Memory assist level: Recognizes or recalls 50 - 74% of the time/requires cueing 25 - 49% of the time    Pain    Therapy/Group: Individual Therapy  Maigan Bittinger 02/27/2017, 8:16 AM

## 2017-02-27 NOTE — Progress Notes (Signed)
[place] PHYSICAL MEDICINE & REHABILITATION     PROGRESS NOTE    Subjective/Complaints:  Feeling well. Working with SLP. No new complaints. Having to be I/O cathed still  ROS: pt denies nausea, vomiting, diarrhea, cough, shortness of breath or chest pain    Objective: Vital Signs: Blood pressure 140/66, pulse 62, temperature 98.5 F (36.9 C), temperature source Oral, resp. rate 18, height 5\' 11"  (1.803 m), weight 79.2 kg (174 lb 9.7 oz), SpO2 96 %. No results found.  Recent Labs  02/27/17 0516  WBC 6.8  HGB 12.5*  HCT 37.8*  PLT 379   No results for input(s): NA, K, CL, GLUCOSE, BUN, CREATININE, CALCIUM in the last 72 hours.  Invalid input(s): CO CBG (last 3)  No results for input(s): GLUCAP in the last 72 hours.  Wt Readings from Last 3 Encounters:  02/23/17 79.2 kg (174 lb 9.7 oz)  02/13/17 78.9 kg (174 lb)  08/31/16 86.2 kg (190 lb)    Physical Exam:  HENT:  Head: Normocephalic.  Eyes: Pupils are equal, round, and reactive to light. Conjunctivaeand EOMare normal.  Neck: Normal range of motion. Neck supple. No tracheal deviationpresent. No thyromegalypresent.  Cardiovascular: RRR without murmur. No JVD      Respiratory: CTA Bilaterally without wheezes or rales. Normal effort.  GI: Soft. Bowel sounds are normal. He exhibits no distension. There is no tenderness. There is no rebound GU: mild scrotal edema.  Neurological: He is alert.  More attentive. Remembers my name. .  Follows commands. Memory deficits. Oriented to place, month, year. Fair sitting balance although leans to left.  Motor 4/5 UE's. LE: 4 to 4+/5 prox to distal. Sensory function grossly intact to LT/pain--stable Skin: Skin is warmand dry.  Skin. Warm and dry  Assessment/Plan: 1. Functional and cognitive deficits secondary to right MCA infarct which require 3+ hours per day of interdisciplinary therapy in a comprehensive inpatient rehab setting. Physiatrist is providing close team  supervision and 24 hour management of active medical problems listed below. Physiatrist and rehab team continue to assess barriers to discharge/monitor patient progress toward functional and medical goals.  Function:  Bathing Bathing position Bathing activity did not occur: N/A Position: Shower  Bathing parts Body parts bathed by patient: Right arm, Left arm, Chest, Abdomen, Right upper leg, Left upper leg, Right lower leg, Left lower leg, Front perineal area, Buttocks Body parts bathed by helper: Back  Bathing assist Assist Level: Touching or steadying assistance(Pt > 75%)      Upper Body Dressing/Undressing Upper body dressing   What is the patient wearing?: Pull over shirt/dress     Pull over shirt/dress - Perfomed by patient: Thread/unthread right sleeve, Thread/unthread left sleeve, Put head through opening, Pull shirt over trunk          Upper body assist Assist Level: Supervision or verbal cues, Set up   Set up : To obtain clothing/put away  Lower Body Dressing/Undressing Lower body dressing Lower body dressing/undressing activity did not occur: N/A What is the patient wearing?: Pants Underwear - Performed by patient: Thread/unthread right underwear leg, Thread/unthread left underwear leg, Pull underwear up/down   Pants- Performed by patient: Thread/unthread right pants leg, Thread/unthread left pants leg, Pull pants up/down Pants- Performed by helper: Thread/unthread left pants leg, Fasten/unfasten pants Non-skid slipper socks- Performed by patient: Don/doff right sock, Don/doff left sock Non-skid slipper socks- Performed by helper: Don/doff right sock, Don/doff left sock Socks - Performed by patient: Don/doff right sock, Don/doff left sock  Shoes - Performed by patient: Don/doff right shoe, Don/doff left shoe Shoes - Performed by helper: Don/doff right shoe, Don/doff left shoe          Lower body assist Assist for lower body dressing: Touching or steadying  assistance (Pt > 75%)      Toileting Toileting Toileting activity did not occur: N/A Toileting steps completed by patient: Adjust clothing prior to toileting, Performs perineal hygiene, Adjust clothing after toileting Toileting steps completed by helper: Adjust clothing after toileting Toileting Assistive Devices: Grab bar or rail  Toileting assist Assist level: Touching or steadying assistance (Pt.75%)   Transfers Chair/bed transfer Chair/bed transfer activity did not occur: Safety/medical concerns Chair/bed transfer method: Stand pivot Chair/bed transfer assist level: Supervision or verbal cues Chair/bed transfer assistive device: Armrests, Patent attorney     Max distance: 150 ft Assist level: Supervision or verbal cues   Wheelchair   Type: Manual Max wheelchair distance: 100 Assist Level: Touching or steadying assistance (Pt > 75%)  Cognition Comprehension Comprehension assist level: Understands basic 75 - 89% of the time/ requires cueing 10 - 24% of the time  Expression Expression assist level: Expresses basic 75 - 89% of the time/requires cueing 10 - 24% of the time. Needs helper to occlude trach/needs to repeat words.  Social Interaction Social Interaction assist level: Interacts appropriately 75 - 89% of the time - Needs redirection for appropriate language or to initiate interaction.  Problem Solving Problem solving assist level: Solves basic 50 - 74% of the time/requires cueing 25 - 49% of the time  Memory Memory assist level: Recognizes or recalls 50 - 74% of the time/requires cueing 25 - 49% of the time  Medical Problem List and Plan: 1. 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 -continue PT, OT, SLP  -making gains toward goals with improved insight and awareness 2. DVT Prophylaxis/Anticoagulation: Eliquis 3. Pain Management: Flexeril and Ultram as needed.  Monitor mental status 4. Mood: Zoloft 25 mg daily, Seroquel 12.5 mg daily at bedtime 5. Neuropsych: This patient iscapable of making decisions on hisown behalf. 6. Skin/Wound Care: Routine skin checks 7. Fluids/Electrolytes/Nutrition: Routine I&O with follow-up chemistries reviewed 8.Atrial fibrillation. Cardiac rate controlled at present Vitals:   02/26/17 1544 02/27/17 0428  BP: (!) 111/47 140/66  Pulse: 76 62  Resp: 18 18  Temp: 98.6 F (37 C) 98.5 F (36.9 C)   9.Escherichia coli UTI. Completed course of Rocephin.    -f/u urine culture neg 10.Hyperlipidemia. Lipitor 11.BPH. Pro scar 5 mg daily.    -increased flomax to bid  -up to toilet to void/double void--- PVRs showing some improvement  -I/O cath prn. Check pvr's as appropriate  -will try low dose urecholine to assist emptying---observer HR/rhythm    LOS (Days) 12 A FACE TO FACE EVALUATION WAS PERFORMED  Ranelle Oyster, MD 02/27/2017 9:04 AM

## 2017-02-27 NOTE — Progress Notes (Signed)
Occupational Therapy Session Note  Patient Details  Name: [name] MRN: [id number] Date of Birth: 1934-03-02  Today's Date: 02/27/2017 OT Individual Time: 1307-1400 OT Individual Time Calculation (min): 53 min    Short Term Goals: Week 1:  OT Short Term Goal 1 (Week 1): Pt will demonstrate improved balance to complete sit to stands from toilet with close S. OT Short Term Goal 1 - Progress (Week 1): Met OT Short Term Goal 2 (Week 1): Pt will demonstrate improved coordination to don pants over L foot with extra time.   OT Short Term Goal 2 - Progress (Week 1): Met OT Short Term Goal 3 (Week 1): Pt will demonstrate improved sitting balance of upright posture when sitting on tub bench.  OT Short Term Goal 3 - Progress (Week 1): Met OT Short Term Goal 4 (Week 1): Pt will complete toileting skills with steadying A. OT Short Term Goal 4 - Progress (Week 1): Met Week 2:  OT Short Term Goal 1 (Week 2): STG=LTG at supervision level 2/2 ELOS       Skilled Therapeutic Interventions/Progress Updates:    Pt seen this session to facilitate dynamic balance, visual scanning, and memory skills.  Pt received in room visiting with his family.  Waited several minutes to allow pt to finish talking to his family.  Pt then ambulated with RW with close S to therapy room while discussing what he did earlier in the day with therapy and what he had for lunch.  He sat in chair and worked on Horticulturist, commercial and processing with a board game of [employer].  Pt attended to the game very well and did well locating game pieces. He did not demonstrate any L inattention.  Pt worked on balance with ambulation with RW going over  Small obstacles and thresholds.  Pt was trying to lift walker over thresholds.  Recommended pt advance walker one wheel at a time instead. Pt was able to repeat demonstrate and stated this method was much easier.   Pt ambulated for over 10 min with no fatigue or LOB.  Pt returned to room and sat  in recliner, quick release belt donned, call light in reach and all needs met.   Therapy Documentation Precautions:  Precautions Precautions: Fall Restrictions Weight Bearing Restrictions: No Therapy Vitals Temp: 97.6 F (36.4 C) Temp Source: Oral Pulse Rate: 82 Resp: 18 BP: 135/64 Patient Position (if appropriate): Sitting Oxygen Therapy SpO2: 100 % O2 Device: Not Delivered Pain: Pain Assessment Pain Assessment: No/denies pain ADL: ADL ADL Comments: see functional navigator  See Function Navigator for Current Functional Status.   Therapy/Group: Individual Therapy  [place] 02/27/2017, 2:16 PM

## 2017-02-27 NOTE — Progress Notes (Signed)
Occupational Therapy Session Note  Patient Details  Name: [name] MRN: [id number] Date of Birth: April 13, 1934  Today's Date: 02/27/2017 OT Individual Time: 1100-1157 OT Individual Time Calculation (min): 57 min    Short Term Goals: Week 2:  OT Short Term Goal 1 (Week 2): STG=LTG at supervision level 2/2 ELOS   Skilled Therapeutic Interventions/Progress Updates:    Upon entering the room, pt seated in recliner chair awaiting therapist with no c/o pain this session. Pt requesting to shower this session. Pt performed sit >stand with supervision and ambulated to dresser to obtain clothing items with close supervision. Pt utilized to RW to carry items into bathroom for bathing. Pt transferred onto TTB with steady assistance for safety. Pt standing to wash buttocks and peri area with steady assistance for safety and pt holding onto grab bar while washing. Pt donned clothing items with sit <>stand from edge of TTB with overall steady assistance for balance. Pt standing at sink for grooming task for 13 minutes while shaving face and brushing teeth with close supervision for safety. Pt returned to recliner chair with quick release belt donned and call bell within reach. Family arriving to visit with family as therapist exited the room.   Therapy Documentation Precautions:  Precautions Precautions: Fall Restrictions Weight Bearing Restrictions: No General:   Vital Signs: Therapy Vitals Temp: 97.6 F (36.4 C) Temp Source: Oral Pulse Rate: 82 Resp: 18 BP: 135/64 Patient Position (if appropriate): Sitting Oxygen Therapy SpO2: 100 % O2 Device: Not Delivered Pain: Pain Assessment Pain Assessment: No/denies pain ADL: ADL ADL Comments: see functional navigator Vision   Perception    Praxis   Exercises:   Other Treatments:    See Function Navigator for Current Functional Status.   Therapy/Group: Individual Therapy  Alen BleacherBradsher, Estee Yohe P 02/27/2017, 5:17 PM

## 2017-02-28 ENCOUNTER — Inpatient Hospital Stay (HOSPITAL_COMMUNITY): Payer: Medicare Other | Admitting: Occupational Therapy

## 2017-02-28 ENCOUNTER — Inpatient Hospital Stay (HOSPITAL_COMMUNITY): Payer: Medicare Other | Admitting: Physical Therapy

## 2017-02-28 ENCOUNTER — Inpatient Hospital Stay (HOSPITAL_COMMUNITY): Payer: Medicare Other

## 2017-02-28 NOTE — Progress Notes (Signed)
Physical Therapy Session Note  Patient Details  Name: [name] MRN: [id number] Date of Birth: 1933/10/11  Today's Date: 02/28/2017 PT Individual Time: 0905-1000 PT Individual Time Calculation (min): 55 min   Short Term Goals: Week 2:  PT Short Term Goal 1 (Week 2): STG = LTG due to estimated d/c date.  Skilled Therapeutic Interventions/Progress Updates:  Pt received in recliner & agreeable to tx. Pt able to ambulate room<>gym with RW & supervision with min cuing for pathfinding. Pt utilized cybex kinetron in standing with BUE support with tactile cuing for upright posture and verbal cuing for increased knee extension. Pt requires seated rest break between each trial 2/2 B knee & general fatigue. During gait pt demonstrates B knee flexion therefore pt transferred onto mat table and therapist performed B hamstring stretch. Pt has full knee extension AROM and has great difficulty relaxing LE to allow therapist to perform PROM stretch. Educated pt on need to limit B knee flexion during gait. Pt performed toe taps on 3" step with mod fading to min assist for balance with improving weight shifting L<>R. Transitioned to toe taps on 6" step and pt only required min assist. At end of session pt left sitting in recliner in room with all needs within reach & QRB donned.   Therapy Documentation Precautions:  Precautions Precautions: Fall Restrictions Weight Bearing Restrictions: No  Pain: Pt noted chronic neck pain but declined request for pain medication. Heating pad applied at end of session.   See Function Navigator for Current Functional Status.   Therapy/Group: Individual Therapy  Sandi MariscalVictoria M Miller 02/28/2017, 10:27 AM

## 2017-02-28 NOTE — Progress Notes (Signed)
Occupational Therapy Session Note  Patient Details  Name: [name] MRN: [id number] Date of Birth: 30-Oct-1933  Today's Date: 02/28/2017 OT Individual Time: 1046-1130 OT Individual Time Calculation (min): 44 min    Short Term Goals: Week 2:  OT Short Term Goal 1 (Week 2): STG=LTG at supervision level 2/2 ELOS   Skilled Therapeutic Interventions/Progress Updates:   Treatment session focused on ADL/self care training, balance training, transfer training, safety awareness and activity tolerance. Upon entering pt in chair and agreeable to completed AM ADls. He reported to have gotten a shower yesterday and agreed to sponge bath at sink. Pt used walker to walk over to dresser with S and pick out his outfit. He walked over to sink with walker and his clothes. Therapist set up his objects at sink and pt completed bathing tasks in standing with close guard assist for unsteadiness. Pt completed UB/LB dressing in seated and standing levels at sink with provided chair and S. Pt instructed on safe transfer techniques and required reminders for hand/foot placement. Pt competed functional mobility in hallway with walker and able to recall safe walking techniques. Pt walked to therapy kitchen and was given instruction for reaching into cabinets for overhead reaching with focus on alternation hands to take objects off helve. Pt completed task with ease and no LOB during task. Pt walked back to room with walker and positioned self in w/c. Safety belt applied and call bell in reach. No c/o of pain or fatigue.    Therapy Documentation Precautions:  Precautions Precautions: Fall Restrictions Weight Bearing Restrictions: No General:   Vital Signs:  Pain: Pain Assessment Pain Assessment: No/denies pain Pain Score: 0-No pain ADL: ADL ADL Comments: see functional navigator Vision   Perception    Praxis   Exercises:   Other Treatments:    See Function Navigator for Current Functional  Status.   Therapy/Group: Individual Therapy  Verdie ShireMolly  Mackensi Mahadeo 02/28/2017, 12:12 PM

## 2017-02-28 NOTE — Progress Notes (Signed)
Patient safety maintained. No needs/concerns voiced at this time. 

## 2017-02-28 NOTE — Progress Notes (Signed)
Occupational Therapy Session Note  Patient Details  Name: [name] MRN: [id number] Date of Birth: 11/03/1933  Today's Date: 02/28/2017 OT Individual Time: 0703-0800 OT Individual Time Calculation (min): 57 min    Short Term Goals: Week 2:  OT Short Term Goal 1 (Week 2): STG=LTG at supervision level 2/2 ELOS   Skilled Therapeutic Interventions/Progress Updates:    Upon entering the room, pt supine in bed with no c/o pain and agreeable to OT intervention. Pt ambulating with RW and close supervision to sink for grooming task while standing. Pt ambulating 1875' with RW and supervision to day room for table top activity. Pt engaged card game of "old maid" to address B hand FMC, memory, and attending/scanning to L. Pt with min cues for turn taking and to located card on L inferior side of table. Pt returned to room at end of session in same manner as above. Pt seated in recliner chair with quick release belt donned and call bell within reach with breakfast place on tray table.   Therapy Documentation Precautions:  Precautions Precautions: Fall Restrictions Weight Bearing Restrictions: No :   ADL: ADL ADL Comments: see functional navigator  See Function Navigator for Current Functional Status.   Therapy/Group: Individual Therapy  Alen BleacherBradsher, Chrystal Zeimet P 02/28/2017, 8:51 AM

## 2017-02-28 NOTE — Progress Notes (Signed)
[place] PHYSICAL MEDICINE & REHABILITATION     PROGRESS NOTE    Subjective/Complaints:  Only one cath over night. No complaints this morning. Good appetite.   ROS: pt denies nausea, vomiting, diarrhea, cough, shortness of breath or chest pain   Objective: Vital Signs: Blood pressure (!) 151/72, pulse 76, temperature 97.8 F (36.6 C), temperature source Oral, resp. rate 17, height 5\' 11"  (1.803 m), weight 78.9 kg (174 lb), SpO2 98 %. No results found.  Recent Labs  02/27/17 0516  WBC 6.8  HGB 12.5*  HCT 37.8*  PLT 379   No results for input(s): NA, K, CL, GLUCOSE, BUN, CREATININE, CALCIUM in the last 72 hours.  Invalid input(s): CO CBG (last 3)  No results for input(s): GLUCAP in the last 72 hours.  Wt Readings from Last 3 Encounters:  02/28/17 78.9 kg (174 lb)  02/13/17 78.9 kg (174 lb)  08/31/16 86.2 kg (190 lb)    Physical Exam:  HENT: bump over right eye resolving Head: Normocephalic.  Eyes: Pupils are equal, round, and reactive to light. Conjunctivaeand EOMare normal.  Neck: Normal range of motion. Neck supple. No tracheal deviationpresent. No thyromegalypresent.  Cardiovascular: RRR without murmur. No JVD     Respiratory: CTA Bilaterally without wheezes or rales. Normal effort   GI: Soft. Bowel sounds are normal. He exhibits no distension. There is no tenderness. There is no rebound GU: mild scrotal edema.  Neurological: He is alert.  Improved insight. Follows commands. Memory deficits. Oriented to place, month, year. Fair sitting balance although leans to left.  Motor 4/5 UE's. LE:   4+/5 prox to distal. Sensory function grossly intact to LT/pain--stable Skin: Skin is warmand dry.  Skin. Warm and dry  Assessment/Plan: 1. Functional and cognitive deficits secondary to right MCA infarct which require 3+ hours per day of interdisciplinary therapy in a comprehensive inpatient rehab setting. Physiatrist is providing close team supervision and 24 hour  management of active medical problems listed below. Physiatrist and rehab team continue to assess barriers to discharge/monitor patient progress toward functional and medical goals.  Function:  Bathing Bathing position Bathing activity did not occur: N/A Position: Standing at sink  Bathing parts Body parts bathed by patient: Right arm, Left arm, Chest, Abdomen, Right upper leg, Left upper leg, Right lower leg, Left lower leg, Front perineal area, Buttocks Body parts bathed by helper: Back  Bathing assist Assist Level: Supervision or verbal cues, Set up      Upper Body Dressing/Undressing Upper body dressing   What is the patient wearing?: Pull over shirt/dress     Pull over shirt/dress - Perfomed by patient: Thread/unthread right sleeve, Thread/unthread left sleeve, Put head through opening, Pull shirt over trunk          Upper body assist Assist Level: Set up   Set up : To obtain clothing/put away  Lower Body Dressing/Undressing Lower body dressing Lower body dressing/undressing activity did not occur: N/A What is the patient wearing?: Pants, Underwear, Non-skid slipper socks Underwear - Performed by patient: Thread/unthread right underwear leg, Thread/unthread left underwear leg, Pull underwear up/down   Pants- Performed by patient: Thread/unthread right pants leg, Thread/unthread left pants leg, Pull pants up/down, Fasten/unfasten pants Pants- Performed by helper: Thread/unthread left pants leg, Fasten/unfasten pants Non-skid slipper socks- Performed by patient: Don/doff right sock, Don/doff left sock Non-skid slipper socks- Performed by helper: Don/doff right sock, Don/doff left sock Socks - Performed by patient: Don/doff right sock, Don/doff left sock   Shoes - Performed  by patient: Don/doff right shoe, Don/doff left shoe Shoes - Performed by helper: Don/doff right shoe, Don/doff left shoe          Lower body assist Assist for lower body dressing: Supervision or verbal  cues      Toileting Toileting Toileting activity did not occur: N/A Toileting steps completed by patient: Adjust clothing prior to toileting, Performs perineal hygiene, Adjust clothing after toileting Toileting steps completed by helper: Adjust clothing after toileting Toileting Assistive Devices: Grab bar or rail  Toileting assist Assist level: Touching or steadying assistance (Pt.75%)   Transfers Chair/bed transfer Chair/bed transfer activity did not occur: Safety/medical concerns Chair/bed transfer method: Stand pivot Chair/bed transfer assist level: Supervision or verbal cues Chair/bed transfer assistive device: Armrests, Patent attorneyWalker     Locomotion Ambulation     Max distance: 150 ft  Assist level: Supervision or verbal cues   Wheelchair   Type: Manual Max wheelchair distance: 100 Assist Level: Touching or steadying assistance (Pt > 75%)  Cognition Comprehension Comprehension assist level: Understands basic 75 - 89% of the time/ requires cueing 10 - 24% of the time  Expression Expression assist level: Expresses basic 75 - 89% of the time/requires cueing 10 - 24% of the time. Needs helper to occlude trach/needs to repeat words.  Social Interaction Social Interaction assist level: Interacts appropriately with others - No medications needed.  Problem Solving Problem solving assist level: Solves basic 50 - 74% of the time/requires cueing 25 - 49% of the time  Memory Memory assist level: Recognizes or recalls 50 - 74% of the time/requires cueing 25 - 49% of the time  Medical Problem List and Plan: 1. 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 -continue PT, OT, SLP  -making gains toward goals with improved insight and awareness 2. DVT Prophylaxis/Anticoagulation: Eliquis 3. Pain Management: Flexeril and Ultram as needed. Monitor mental status 4. Mood: Zoloft 25 mg daily, Seroquel 12.5 mg daily at  bedtime 5. Neuropsych: This patient iscapable of making decisions on hisown behalf. 6. Skin/Wound Care: Routine skin checks 7. Fluids/Electrolytes/Nutrition: Routine I&O with follow-up chemistries reviewed 8.Atrial fibrillation. Cardiac rate controlled at present Vitals:   02/27/17 1408 02/28/17 0424  BP: 135/64 (!) 151/72  Pulse: 82 76  Resp: 18 17  Temp: 97.6 F (36.4 C) 97.8 F (36.6 C)   9.Escherichia coli UTI. Completed course of Rocephin.    -f/u urine culture neg 10.Hyperlipidemia. Lipitor 11.BPH. Pro scar 5 mg daily.    -increased flomax to bid  -up to toilet to void/double void--- PVRs showing   improvement  -I/O cath prn. Check pvr's as appropriate  -continue low dose urecholine to assist emptying---observer HR/rhythm    LOS (Days) 13 A FACE TO FACE EVALUATION WAS PERFORMED  Ranelle OysterSWARTZ,Maykel Reitter T, MD 02/28/2017 1:02 PM

## 2017-02-28 NOTE — Progress Notes (Signed)
Speech Language Pathology Daily Session Note  Patient Details  Name: [name] MRN: [id number] Date of Birth: September 06, 1933  Today's Date: 02/28/2017 SLP Individual Time: [id number] SLP Individual Time Calculation (min): 29 min  Short Term Goals: Week 2: SLP Short Term Goal 1 (Week 2): Pt will utilize external aids to recall basic, daily information with min assist multimodal cues.   SLP Short Term Goal 2 (Week 2): Pt will recognize and correct errors during basic, familiar tasks with min assist multimodal cues.   SLP Short Term Goal 3 (Week 2): Pt will complete basic, familiar tasks with min assist multimodal cues for functional problem solving.   SLP Short Term Goal 4 (Week 2): Pt will return demonstration of safety precautions with supervision assist multimodal cues.   SLP Short Term Goal 5 (Week 2): Pt will demonstrate selective attention in a moderately distracting enviornment during basic tasks for 30 minutes with supervision verbal cues for redirection to task.   SLP Short Term Goal 6 (Week 2): Pt will scann/attened to left field of enviornment during functional tasks with supervision verbal cues.   Skilled Therapeutic Interventions: Skilled St treatment focused on cognitive goals. Pt demonstrated ability to recall diagnosis and plan for discharge, supervision. SLP facilitated problem solving/safety awareness task given visual picture scene, pt identified problems and provided solutions with Mod a question cues. SLP faiclited memory recall utilizing memory book with Min A verbal cues. Pt left in chair with call bell within reach. Recommend continue ST services at this time to prepare for discharge.     Function:  Cognition Comprehension Comprehension assist level: Understands basic 75 - 89% of the time/ requires cueing 10 - 24% of the time  Expression   Expression assist level: Expresses basic 75 - 89% of the time/requires cueing 10 - 24% of the time. Needs helper to occlude  trach/needs to repeat words.  Social Interaction Social Interaction assist level: Interacts appropriately with others - No medications needed.  Problem Solving Problem solving assist level: Solves basic 50 - 74% of the time/requires cueing 25 - 49% of the time  Memory Memory assist level: Recognizes or recalls 50 - 74% of the time/requires cueing 25 - 49% of the time    Pain Pain Assessment Pain Assessment: No/denies pain  Therapy/Group: Individual Therapy  Treven Holtman  [facility] 02/28/2017, 9:23 AM

## 2017-03-01 ENCOUNTER — Inpatient Hospital Stay (HOSPITAL_COMMUNITY): Payer: Medicare Other | Admitting: Physical Therapy

## 2017-03-01 ENCOUNTER — Inpatient Hospital Stay (HOSPITAL_COMMUNITY): Payer: Medicare Other | Admitting: Speech Pathology

## 2017-03-01 ENCOUNTER — Inpatient Hospital Stay (HOSPITAL_COMMUNITY): Payer: Medicare Other | Admitting: Occupational Therapy

## 2017-03-01 NOTE — Progress Notes (Signed)
Occupational Therapy Discharge Summary  Patient Details  Name: [name] MRN: [id number] Date of Birth: Dec 28, 1933  Patient has met 88 of 13 long term goals due to improved activity tolerance, improved balance, postural control, ability to compensate for deficits, improved attention, improved awareness and improved coordination.  Patient to discharge at overall Supervision level.  Patient's care partner is independent to provide the necessary physical and cognitive assistance at discharge.    Reasons goals not met: n/a  Recommendation:  Patient will benefit from ongoing skilled OT services in outpatient setting to continue to advance functional skills in the area of BADL and iADL.  Equipment: No equipment provided  Reasons for discharge: treatment goals met  Patient/family agrees with progress made and goals achieved: Yes  OT Discharge Precautions/Restrictions  Precautions Precautions: Fall Restrictions Weight Bearing Restrictions: No   Vital Signs Therapy Vitals Pulse Rate: 98 BP: (!) 109/58 Patient Position (if appropriate): Sitting Pain Pain Assessment Pain Assessment: No/denies pain ADL ADL ADL Comments: see functional navigator Vision Baseline Vision/History: Wears glasses Wears Glasses: At all times Alignment/Gaze Preference: Within Defined Limits Tracking/Visual Pursuits: Able to track stimulus in all quads without difficulty Visual Fields: No apparent deficits Additional Comments: WFL Perception  Perception: Within Functional Limits Praxis Praxis: Intact Cognition Overall Cognitive Status: Impaired/Different from baseline Orientation Level: Oriented X4 Memory: Impaired Awareness: Impaired Safety/Judgment: Impaired Sensation Sensation Light Touch: Appears Intact Stereognosis: Appears Intact Hot/Cold: Appears Intact Proprioception: Appears Intact Motor  Motor Motor:  (general weakness) Mobility    S with RW Trunk/Postural Assessment     WFL for basic care Balance Dynamic Sitting Balance Dynamic Sitting - Level of Assistance: 7: Independent Static Standing Balance Static Standing - Level of Assistance: 5: Stand by assistance Dynamic Standing Balance Dynamic Standing - Level of Assistance: 5: Stand by assistance Extremity/Trunk Assessment RUE Assessment RUE Assessment: Within Functional Limits LUE Assessment LUE Assessment: Within Functional Limits   See Function Navigator for Current Functional Status.  [place] 03/01/2017, 12:55 PM

## 2017-03-01 NOTE — Progress Notes (Signed)
[facility]     PROGRESS NOTE    Subjective/Complaints:   Had a good night. Working with therapy already. Still requiring I/O caths. No problems with urecholine.   ROS: pt denies nausea, vomiting, diarrhea, cough, shortness of breath or chest pain   Objective: Vital Signs: Blood pressure 129/72, pulse 70, temperature 98.2 F (36.8 C), temperature source Oral, resp. rate 16, height 5\' 11"  (1.803 m), weight 78.9 kg (174 lb), SpO2 98 %. No results found.  Recent Labs  02/27/17 0516  WBC 6.8  HGB 12.5*  HCT 37.8*  PLT 379   No results for input(s): NA, K, CL, GLUCOSE, BUN, CREATININE, CALCIUM in the last 72 hours.  Invalid input(s): CO CBG (last 3)  No results for input(s): GLUCAP in the last 72 hours.  Wt Readings from Last 3 Encounters:  02/28/17 78.9 kg (174 lb)  02/13/17 78.9 kg (174 lb)  08/31/16 86.2 kg (190 lb)    Physical Exam:  HENT: bump over right eye resolving, compressible  Head: Normocephalic.  Eyes: Pupils are equal, round, and reactive to light. Conjunctivaeand EOMare normal.  Neck: Normal range of motion. Neck supple. No tracheal deviationpresent. No thyromegalypresent.  Cardiovascular: RRR without murmur. No JVD      Respiratory: CTA Bilaterally without wheezes or rales. Normal effort    GI: Soft. Bowel sounds are normal. He exhibits no distension. There is no tenderness. There is no rebound GU: mild scrotal edema.  Neurological: He is alert.  Improved awareness. Some short term memory deficits. .  Motor 4/5 UE's. LE:   4+/5 prox to distal. Sensory function grossly intact to LT/pain--stable Skin: Skin is warmand dry.  Skin. Warm and dry  Assessment/Plan: 1. Functional and cognitive deficits secondary to right MCA infarct which require 3+ hours per day of interdisciplinary therapy in a comprehensive inpatient rehab setting. Physiatrist is providing close team supervision and 24 hour management of active medical  problems listed below. Physiatrist and rehab team continue to assess barriers to discharge/monitor patient progress toward functional and medical goals.  Function:  Bathing Bathing position Bathing activity did not occur: N/A Position: Standing at sink  Bathing parts Body parts bathed by patient: Right arm, Left arm, Chest, Abdomen, Right upper leg, Left upper leg, Right lower leg, Left lower leg, Front perineal area, Buttocks Body parts bathed by helper: Back  Bathing assist Assist Level: Supervision or verbal cues, Set up      Upper Body Dressing/Undressing Upper body dressing   What is the patient wearing?: Pull over shirt/dress     Pull over shirt/dress - Perfomed by patient: Thread/unthread right sleeve, Thread/unthread left sleeve, Put head through opening, Pull shirt over trunk          Upper body assist Assist Level: Set up   Set up : To obtain clothing/put away  Lower Body Dressing/Undressing Lower body dressing Lower body dressing/undressing activity did not occur: N/A What is the patient wearing?: Pants Underwear - Performed by patient: Thread/unthread right underwear leg, Thread/unthread left underwear leg, Pull underwear up/down   Pants- Performed by patient: Thread/unthread right pants leg, Thread/unthread left pants leg, Pull pants up/down, Fasten/unfasten pants Pants- Performed by helper: Thread/unthread left pants leg, Fasten/unfasten pants Non-skid slipper socks- Performed by patient: Don/doff right sock, Don/doff left sock Non-skid slipper socks- Performed by helper: Don/doff right sock, Don/doff left sock Socks - Performed by patient: Don/doff right sock, Don/doff left sock   Shoes - Performed by patient: Don/doff right shoe,  Don/doff left shoe Shoes - Performed by helper: Don/doff right shoe, Don/doff left shoe          Lower body assist Assist for lower body dressing: Supervision or verbal cues      Toileting Toileting Toileting activity did not  occur: N/A Toileting steps completed by patient: Adjust clothing prior to toileting, Performs perineal hygiene, Adjust clothing after toileting Toileting steps completed by helper: Adjust clothing after toileting Toileting Assistive Devices: Grab bar or rail  Toileting assist Assist level: Touching or steadying assistance (Pt.75%)   Transfers Chair/bed transfer Chair/bed transfer activity did not occur: Safety/medical concerns Chair/bed transfer method: Stand pivot Chair/bed transfer assist level: Supervision or verbal cues Chair/bed transfer assistive device: Armrests, Patent attorneyWalker     Locomotion Ambulation     Max distance: 150 ft  Assist level: Supervision or verbal cues   Wheelchair   Type: Manual Max wheelchair distance: 100 Assist Level: Touching or steadying assistance (Pt > 75%)  Cognition Comprehension Comprehension assist level: Understands basic 75 - 89% of the time/ requires cueing 10 - 24% of the time  Expression Expression assist level: Expresses basic 75 - 89% of the time/requires cueing 10 - 24% of the time. Needs helper to occlude trach/needs to repeat words.  Social Interaction Social Interaction assist level: Interacts appropriately with others - No medications needed.  Problem Solving Problem solving assist level: Solves basic 50 - 74% of the time/requires cueing 25 - 49% of the time  Memory Memory assist level: Recognizes or recalls 50 - 74% of the time/requires cueing 25 - 49% of the time  Medical Problem List and Plan: 1. 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 -continue PT, OT, SLP  -ELOS 7/27  -Patient to see Rehab MD in the office for transitional care encounter in 1-2 weeks.  2. DVT Prophylaxis/Anticoagulation: Eliquis 3. Pain Management: Flexeril and Ultram as needed. Monitor mental status 4. Mood: Zoloft 25 mg daily, Seroquel 12.5 mg daily at bedtime 5. Neuropsych:  This patient iscapable of making decisions on hisown behalf. 6. Skin/Wound Care: Routine skin checks 7. Fluids/Electrolytes/Nutrition: Routine I&O with follow-up chemistries reviewed 8.Atrial fibrillation. Cardiac rate controlled at present Vitals:   02/28/17 1420 03/01/17 0428  BP: (!) 117/48 129/72  Pulse: 74 70  Resp: 18 16  Temp: 98.2 F (36.8 C) 98.2 F (36.8 C)   9.Escherichia coli UTI. Completed course of Rocephin.    -f/u urine culture neg 10.Hyperlipidemia. Lipitor 11.BPH. Pro scar 5 mg daily.    -increased flomax to bid  -up to toilet to void/double void--- PVRs showing   improvement  -I/O cath prn. Pt/family familiar with performing these from prior admissions  -outpt urology follow up  -     LOS (Days) 14 A FACE TO FACE EVALUATION WAS PERFORMED  Ranelle OysterSWARTZ,Saharsh Sterling T, MD 03/01/2017 8:45 AM

## 2017-03-01 NOTE — Progress Notes (Signed)
Occupational Therapy Session Note  Patient Details  Name: [name] MRN: [id number] Date of Birth: 19-Sep-1933  Today's Date: 03/01/2017 OT Individual Time: 1000-1100 OT Individual Time Calculation (min): 60 min    Short Term Goals: Week 1:  OT Short Term Goal 1 (Week 1): Pt will demonstrate improved balance to complete sit to stands from toilet with close S. OT Short Term Goal 1 - Progress (Week 1): Met OT Short Term Goal 2 (Week 1): Pt will demonstrate improved coordination to don pants over L foot with extra time.   OT Short Term Goal 2 - Progress (Week 1): Met OT Short Term Goal 3 (Week 1): Pt will demonstrate improved sitting balance of upright posture when sitting on tub bench.  OT Short Term Goal 3 - Progress (Week 1): Met OT Short Term Goal 4 (Week 1): Pt will complete toileting skills with steadying A. OT Short Term Goal 4 - Progress (Week 1): Met Week 2:  OT Short Term Goal 1 (Week 2): STG=LTG at supervision level 2/2 ELOS       Skilled Therapeutic Interventions/Progress Updates:    Pt seen this session for ADL retraining in the tub room to simulate his home environment.  Pt was able to step in and out of tub with S using a bar, stood most of the shower and used the seat when washing/ drying feet.  Set up/S with dressing and grooming. Pt ambulated with RW over 300 ft 2x with S.  Discussed home bathroom set up and needs. With pt, wrote out a list of items for pt's son to purchase, such as an additional grab bar, HH shower, shower seat, tub strips, and bath mat.  Reassessed pt's vision, coordination for discharge. He has made significant improvements. Pt in room with all needs met, quick release belt on.  Therapy Documentation Precautions:  Precautions Precautions: Fall Restrictions Weight Bearing Restrictions: No Pain: Pain Assessment Pain Assessment: No/denies pain ADL: ADL ADL Comments: see functional navigator  See Function Navigator for Current  Functional Status.   Therapy/Group: Individual Therapy  [place] 03/01/2017, 12:46 PM

## 2017-03-01 NOTE — Progress Notes (Signed)
Physical Therapy Discharge Summary  Patient Details  Name: [name] MRN: [id number] Date of Birth: 12/06/1933  Today's Date: 03/01/2017 PT Individual Time: 815-930 and 1347-1420 PT Individual Time Calculation (min): 75 min and 33 min    Patient has met 9 of 9 long term goals due to improved activity tolerance, improved balance, improved postural control, increased strength, ability to compensate for deficits, functional use of  left upper extremity and left lower extremity, improved attention, improved awareness and improved coordination.  Patient to discharge at an ambulatory level Supervision with RW.   Patient's daughter was present for one session and therapist educated her on pt's need for 24 hr supervision following d/c for increased safety.  Reasons goals not met: n/a  Recommendation:  Patient will benefit from ongoing skilled PT services in home health setting to continue to advance safe functional mobility, address ongoing impairments in decreased balance, decreased coordination, impaired cognition, decreased recall, decreased endurance and strength, and minimize fall risk.  Equipment: None provided, pt already has RW at home.   Reasons for discharge: treatment goals met  Patient/family agrees with progress made and goals achieved: Yes  Skilled PT Treatment: Treatment 1: Pt received in bed requesting to finish breakfast. Therapist returned a few minutes later and pt agreeable to tx. Pt donned shorts with set up assist and supervision for standing balance. Pt performed grooming tasks at sink (brushing teeth, washing face) with supervision for standing balance. Pt completed all grad day activities at supervision level with RW; please see below for details. Educated pt on sequencing and technique for car transfer at sedan simulated height, curb negotiation, and general need to square up to surfaces before transferring to sitting. During session pt reports slight dizziness  with head movement; vitals assessed (see below) & RN made aware. RN cleared pt to continue in light activity and to reassess BP later. Pt utilized nu-step on level 5 x 12 minutes with all 4 extremities with task focusing on BLE strengthening, coordination of reciprocal movements, and endurance training. After task BP = 109/58 mmHg, HR = 98 bpm. At end of session pt left in recliner with QRB donned & all needs within reach.  Educated pt on home modifications to increase safety (remove throw rugs or any tripping hazards).     Treatment 2: Pt received in recliner & agreeable to tx. Pt reported continued chronic neck & shoulder pain. Pt ambulated room<>gym with RW & supervision with improved ability to walk at an appropriate gait speed without cuing. Pt completed Berg Balance Test & scored 32/56; educated pt on interpretation of score & current fall risk. Patient demonstrates increased fall risk as noted by score of 32/56 on Berg Balance Scale.  (<36= high risk for falls, close to 100%; 37-45 significant >80%; 46-51 moderate >50%; 52-55 lower >25%). Educated pt on need to hold to walker at all times when ambulating and do not attempt to carry other objects simultaneously. At end of session pt left in recliner with QRB donned & all needs within reach. Pt continues to require cuing for pathfinding room<>gym.    PT Discharge Precautions/Restrictions Precautions Precautions: Fall Restrictions Weight Bearing Restrictions: No   Vital Signs Therapy Vitals Pulse Rate: 98 BP: (!) 85/54 Patient Position (if appropriate): Sitting   Pain Chronic pain in R shoulder/neck.   Vision/Perception  Pt wears glasses at all times at baseline. Pt reports no changes in baseline vision.  Decreased attention to lower L side.  Cognition Overall Cognitive Status: Impaired/Different  from baseline Orientation Level: Oriented X4 Memory: Impaired Awareness: Impaired Safety/Judgment:  Impaired  Sensation Sensation Light Touch: Appears Intact (BLE)  Motor  Motor Motor:  (general weakness)   Mobility Bed Mobility Bed Mobility: Rolling Right;Rolling Left;Supine to Sit;Sit to Supine (regular bed in rehab apartment) Rolling Right: 6: Modified independent (Device/Increase time) Rolling Left: 6: Modified independent (Device/Increase time) Supine to Sit: 6: Modified independent (Device/Increase time) Sit to Supine: 6: Modified independent (Device/Increase time) Transfers Transfers: Yes Sit to Stand: 5: Supervision;With armrests Stand to Sit: 5: Supervision  Locomotion  Ambulation Ambulation: Yes Ambulation/Gait Assistance: 5: Supervision Ambulation Distance (Feet): 200 Feet Assistive device: Rolling walker Ambulation/Gait Assistance Details: Verbal cues for precautions/safety;Verbal cues for technique Gait Gait: Yes Gait Pattern: Step-through pattern;Decreased step length - left;Decreased step length - right;Decreased stride length (decreased B knee extension) Stairs / Additional Locomotion Stairs: Yes Stairs Assistance: 5: Supervision Stair Management Technique: Two rails Number of Stairs: 12 Height of Stairs: 6 (inches) Ramp: 5: Supervision (with RW) Curb: 5: Supervision (with RW & max cuing for sequecing/technique) Wheelchair Mobility Wheelchair Mobility: No  Balance Balance Balance Assessed: Yes Standardized Balance Assessment Standardized Balance Assessment: Berg Balance Test Berg Balance Test Sit to Stand: Able to stand without using hands and stabilize independently Standing Unsupported: Able to stand 2 minutes with supervision Sitting with Back Unsupported but Feet Supported on Floor or Stool: Able to sit safely and securely 2 minutes Stand to Sit: Sits safely with minimal use of hands Transfers: Able to transfer with verbal cueing and /or supervision (transfers with minor use of hands but requires supervision) Standing Unsupported with Eyes  Closed: Able to stand 10 seconds with supervision Standing Ubsupported with Feet Together: Able to place feet together independently and stand for 1 minute with supervision From Standing, Reach Forward with Outstretched Arm: Reaches forward but needs supervision From Standing Position, Pick up Object from Floor: Able to pick up shoe, needs supervision From Standing Position, Turn to Look Behind Over each Shoulder: Needs assist to keep from losing balance and falling Turn 360 Degrees: Needs close supervision or verbal cueing (completes turns to both directions with extra time and supervision) Standing Unsupported, Alternately Place Feet on Step/Stool: Able to complete >2 steps/needs minimal assist Standing Unsupported, One Foot in Front: Able to take small step independently and hold 30 seconds Standing on One Leg: Tries to lift leg/unable to hold 3 seconds but remains standing independently Total Score: 32   Extremity Assessment  RLE Assessment RLE Assessment: Within Functional Limits LLE Assessment LLE Assessment: Within Functional Limits   See Function Navigator for Current Functional Status.  Waunita Schooner 03/01/2017, 4:19 PM

## 2017-03-01 NOTE — Plan of Care (Signed)
Problem: RH Car Transfers Goal: LTG Patient will perform car transfers with assist (PT) LTG: Patient will perform car transfers with assistance (PT).  Outcome: Completed/Met Date Met: 03/01/17 With RW  Problem: RH Ambulation Goal: LTG Patient will ambulate in controlled environment (PT) LTG: Patient will ambulate in a controlled environment, # of feet with assistance (PT).  Outcome: Completed/Met Date Met: 03/01/17 150 ft with RW Goal: LTG Patient will ambulate in home environment (PT) LTG: Patient will ambulate in home environment, # of feet with assistance (PT).  Outcome: Completed/Met Date Met: 03/01/17 50 ft with RW  Problem: RH Wheelchair Mobility Goal: LTG Patient will propel w/c in controlled environment (PT) LTG: Patient will propel wheelchair in controlled environment, # of feet with assist (PT)  Outcome: Adequate for Discharge Pt to d/c at ambulatory level

## 2017-03-01 NOTE — Progress Notes (Signed)
Speech Language Pathology Discharge Summary  Patient Details  Name: [name] MRN: [id number] Date of Birth: 1933/11/29  Today's Date: 03/01/2017 SLP Individual Time: 1300-1330 SLP Individual Time Calculation (min): 30 min   Skilled Therapeutic Interventions:  Skilled treatment session focused on cognition goals. SLP facilitated session by providing Min A to recall discharge on 03/01/17 and handout given describing compensatory memory strategies. Pt able to read and create solutions for home environment with Min A cues.      Patient has met 5 of 5 long term goals.  Patient to discharge at [facility] level.    Clinical Impression/Discharge Summary:   Pt has made great progress during skilled ST sessions and as a result he has met 5 of 5 LTGs. Pt continue to require Min A cues which are possible complicated by history of memory deficits. Education provided to family on need for 24 hour supervision with all questions answered to pt and daughter satisfaction.  Care Partner:  Caregiver Able to Provide Assistance: Yes  Type of Caregiver Assistance: Physical;Cognitive  Recommendation:  Outpatient SLP;24 hour supervision/assistance;Home Health SLP  Rationale for SLP Follow Up: Maximize cognitive function and independence;Reduce caregiver burden   Equipment:     Reasons for discharge: Treatment goals met   Patient/Family Agrees with Progress Made and Goals Achieved: Yes   Function:  Eating Eating     Eating Assist Level: No help, No cues           Cognition Comprehension Comprehension assist level: Follows basic conversation/direction with extra time/assistive device  Expression   Expression assist level: Expresses basic needs/ideas: With no assist  Social Interaction Social Interaction assist level: Interacts appropriately with others - No medications needed.  Problem Solving Problem solving assist level: Solves basic 90% of the time/requires cueing < 10% of the  time  Memory Memory assist level: Recognizes or recalls 75 - 89% of the time/requires cueing 10 - 24% of the time   Fredrick Dray 03/01/2017, 1:10 PM

## 2017-03-01 NOTE — Progress Notes (Signed)
Patient rested well throughout HS shift.  OOB to void and able to cath himself w/ NT assistance.  Patient safety maintained and no needs/concerns voiced at this time.

## 2017-03-02 MED ORDER — SERTRALINE HCL 25 MG PO TABS: 25.0000 mg | ORAL_TABLET | Freq: Every day | ORAL | 0 refills | Status: DC

## 2017-03-02 MED ORDER — TAMSULOSIN HCL 0.4 MG PO CAPS: 0.4000 mg | ORAL_CAPSULE | Freq: Two times a day (BID) | ORAL | 0 refills | Status: DC

## 2017-03-02 MED ORDER — CYCLOBENZAPRINE HCL 5 MG PO TABS: 5.0000 mg | ORAL_TABLET | Freq: Three times a day (TID) | ORAL | 0 refills | Status: DC | PRN

## 2017-03-02 MED ORDER — ATORVASTATIN CALCIUM 80 MG PO TABS: 80.0000 mg | ORAL_TABLET | Freq: Every day | ORAL | 0 refills | Status: DC

## 2017-03-02 MED ORDER — MUSCLE RUB 10-15 % EX CREA: 1.0000 "application " | TOPICAL_CREAM | CUTANEOUS | 0 refills | Status: DC | PRN

## 2017-03-02 MED ORDER — APIXABAN 5 MG PO TABS: 5.0000 mg | ORAL_TABLET | Freq: Two times a day (BID) | ORAL | 0 refills | Status: DC

## 2017-03-02 MED ORDER — QUETIAPINE FUMARATE 25 MG PO TABS: 12.5000 mg | ORAL_TABLET | Freq: Every day | ORAL | 0 refills | Status: DC

## 2017-03-02 MED ORDER — FINASTERIDE 5 MG PO TABS: 5.0000 mg | ORAL_TABLET | Freq: Every day | ORAL | 0 refills | Status: DC

## 2017-03-02 MED ORDER — FERROUS SULFATE 325 (65 FE) MG PO TABS: 325.0000 mg | ORAL_TABLET | Freq: Every day | ORAL | 0 refills | Status: DC

## 2017-03-02 MED ORDER — DOCUSATE SODIUM 100 MG PO CAPS: 100.0000 mg | ORAL_CAPSULE | Freq: Every day | ORAL | 0 refills | Status: DC | PRN

## 2017-03-02 NOTE — Progress Notes (Signed)
Pt discharged to home with daughter. Discharge packet given and instructions were explained. Pt discharged to home with all belongings.

## 2017-03-02 NOTE — Discharge Instructions (Signed)
Inpatient Rehab Discharge Instructions  Gregory HaysGeorge Thomas [name] [name] Discharge date and time: No discharge date for patient encounter.   Activities/Precautions/ Functional Status: Activity: no lifting, driving, or strenuous exercise  till cleared by MD Diet: cardiac diet Wound Care: none needed   Functional status:  ___ No restrictions     ___ Walk up steps independently _X__ 24/7 supervision/assistance   ___ Walk up steps with assistance ___ Intermittent supervision/assistance  ___ Bathe/dress independently ___ Walk with walker     _x__ Bathe/dress with assistance ___ Walk Independently    ___ Shower independently ___ Walk with assistance    ___ Shower with assistance _X__ No alcohol     ___ Return to work/school ________    STROKE/TIA DISCHARGE INSTRUCTIONS SMOKING Cigarette smoking nearly doubles your risk of having a stroke & is the single most alterable risk factor  If you smoke or have smoked in the last 12 months, you are advised to quit smoking for your health.  Most of the excess cardiovascular risk related to smoking disappears within a year of stopping.  Ask you doctor about anti-smoking medications  [place] Quit Line: 1-800-QUIT NOW  Free Smoking Cessation Classes [phone]  CHOLESTEROL Know your levels; limit fat & cholesterol in your diet  Lipid Panel  No results found for: CHOL, TRIG, HDL, CHOLHDL, VLDL, LDLCALC    Many patients benefit from treatment even if their cholesterol is at goal.  Goal: Total Cholesterol (CHOL) less than 160  Goal:  Triglycerides (TRIG) less than 150  Goal:  HDL greater than 40  Goal:  LDL (LDLCALC) less than 100   BLOOD PRESSURE American Stroke Association blood pressure target is less that 120/80 mm/Hg  Your discharge blood pressure is:  BP: (!) 153/79  Monitor your blood pressure  Limit your salt and alcohol intake  Many individuals will require more than one medication for high blood pressure  DIABETES (A1c is a blood sugar  average for last 3 months) Goal HGBA1c is under 7% (HBGA1c is blood sugar average for last 3 months)  Diabetes: No known diagnosis of diabetes    No results found for: HGBA1C   Your HGBA1c can be lowered with medications, healthy diet, and exercise.  Check your blood sugar as directed by your physician  Call your physician if you experience unexplained or low blood sugars.  PHYSICAL ACTIVITY/REHABILITATION Goal is 30 minutes at least 4 days per week  Activity: Increase activity slowly, and No driving, Therapies: Physical Therapy: Home Health Return to work:   Activity decreases your risk of heart attack and stroke and makes your heart stronger.  It helps control your weight and blood pressure; helps you relax and can improve your mood.  Participate in a regular exercise program.  Talk with your doctor about the best form of exercise for you (dancing, walking, swimming, cycling).  DIET/WEIGHT Goal is to maintain a healthy weight  Your discharge diet is: Diet Heart Room service appropriate? Yes; Fluid consistency: Thin  liquids Your height is:  Height: 5\' 11"  (180.3 cm) Your current weight is: Weight: 78.9 kg (174 lb) Your Body Mass Index (BMI) is:  BMI (Calculated): 24.4  Following the type of diet specifically designed for you will help prevent another stroke.  You are at goal weight range is:    Your goal Body Mass Index (BMI) is 19-24.  Healthy food habits can help reduce 3 risk factors for stroke:  High cholesterol, hypertension, and excess weight.  RESOURCES Stroke/Support Group:  Call  [phone]   STROKE EDUCATION PROVIDED/REVIEWED AND GIVEN TO PATIENT Stroke warning signs and symptoms How to activate emergency medical system (call 911). Medications prescribed at discharge. Need for follow-up after discharge. Personal risk factors for stroke. Pneumonia vaccine given:  Flu vaccine given:  My questions have been answered, the writing is legible, and I understand these  instructions.  I will adhere to these goals & educational materials that have been provided to me after my discharge from the hospital.    COMMUNITY REFERRALS UPON DISCHARGE:   Home Health:   PT     OT      RN     Agency:  [facility] Phone:  [phone] Medical Equipment/Items Ordered:  Amil Amen, OT left a note for the family in the room for shower equipment suggestions.  Please use the rolling walker you have at home.   GENERAL COMMUNITY RESOURCES FOR PATIENT/FAMILY: Support Groups:  [place] Stroke Support Group                             Meets the 4th Tuesday of the month, from 12:15-1:30 PM                             [facility] [address], [employer]                             For information or to register, call [phone]  Special Instructions:    My questions have been answered and I understand these instructions. I will adhere to these goals and the provided educational materials after my discharge from the hospital.  Patient/Caregiver Signature _______________________________ Date __________  Clinician Signature _______________________________________ Date __________  Please bring this form and your medication list with you to all your follow-up doctor's appointments. Information on my medicine - ELIQUIS (apixaban)  This medication education was reviewed with me or my healthcare representative as part of my discharge preparation.    Why was Eliquis prescribed for you? Eliquis was prescribed for you to reduce the risk of a blood clot forming that can cause a stroke if you have a medical condition called atrial fibrillation (a type of irregular heartbeat).  What do You need to know about Eliquis ? Take your Eliquis TWICE DAILY - one tablet in the morning and one tablet in the evening with or without food. If you have difficulty swallowing the tablet whole please discuss with your pharmacist how to take  the medication safely.  Take Eliquis exactly as prescribed by your doctor and DO NOT stop taking Eliquis without talking to the doctor who prescribed the medication.  Stopping may increase your risk of developing a stroke.  Refill your prescription before you run out.  After discharge, you should have regular check-up appointments with your healthcare provider that is prescribing your Eliquis.  In the future your dose may need to be changed if your kidney function or weight changes by a significant amount or as you get older.  What do you do if you miss a dose? If you miss a dose, take it as soon as you remember on the same day and resume taking twice daily.  Do not take more than one dose of ELIQUIS at the same time to make up a missed dose.  Important Safety Information A possible side effect of Eliquis is bleeding. You should call your healthcare provider right away if you experience any of the following: ? Bleeding from an injury or your nose that does not stop. ? Unusual colored urine (red or dark brown) or unusual colored stools (red or black). ? Unusual bruising for unknown reasons. ? A serious fall or if you hit your head (even if there is no bleeding).  Some medicines may interact with Eliquis and might increase your risk of bleeding or clotting while on Eliquis. To help avoid this, consult your healthcare provider or pharmacist prior to using any new prescription or non-prescription medications, including herbals, vitamins, non-steroidal anti-inflammatory drugs (NSAIDs) and supplements.  This website has more information on Eliquis (apixaban): http://[email or web address].com/eliquis/home

## 2017-03-02 NOTE — Progress Notes (Signed)
[facility] PHYSICAL MEDICINE & REHABILITATION     PROGRESS NOTE    Subjective/Complaints:   No new issues. Excited to go home. Still requiring I/O caths  ROS: pt denies nausea, vomiting, diarrhea, cough, shortness of breath or chest pain   Objective: Vital Signs: Blood pressure (!) 153/79, pulse 64, temperature 97.8 F (36.6 C), temperature source Oral, resp. rate 16, height 5\' 11"  (1.803 m), weight 78.9 kg (174 lb), SpO2 96 %. No results found. No results for input(s): WBC, HGB, HCT, PLT in the last 72 hours. No results for input(s): NA, K, CL, GLUCOSE, BUN, CREATININE, CALCIUM in the last 72 hours.  Invalid input(s): CO CBG (last 3)  No results for input(s): GLUCAP in the last 72 hours.  Wt Readings from Last 3 Encounters:  02/28/17 78.9 kg (174 lb)  02/13/17 78.9 kg (174 lb)  08/31/16 86.2 kg (190 lb)    Physical Exam:  HENT: bump over right eye resolving, compressible  Head: Normocephalic.  Eyes: Pupils are equal, round, and reactive to light. Conjunctivaeand EOMare normal.  Neck: Normal range of motion. Neck supple. No tracheal deviationpresent. No thyromegalypresent.  Cardiovascular: RRR      Respiratory: normal effort    GI: Soft. Bowel sounds are normal. He exhibits no distension. There is no tenderness. There is no rebound GU: mild scrotal edema.  Neurological: He is alert.  Improved awareness. Some short term memory deficits. .  Motor 4/5 UE's. LE:   4+/5 prox to distal. Sensory function grossly intact to LT/pain--stable Skin: Skin is warmand dry.  Skin. Warm and dry  Assessment/Plan: 1. Functional and cognitive deficits secondary to right MCA infarct which require 3+ hours per day of interdisciplinary therapy in a comprehensive inpatient rehab setting. Physiatrist is providing close team supervision and 24 hour management of active medical problems listed below. Physiatrist and rehab team continue to assess barriers to discharge/monitor patient progress  toward functional and medical goals.  Function:  Bathing Bathing position Bathing activity did not occur: N/A Position: Shower  Bathing parts Body parts bathed by patient: Right arm, Left arm, Chest, Abdomen, Right upper leg, Left upper leg, Right lower leg, Left lower leg, Front perineal area, Buttocks Body parts bathed by helper: Back  Bathing assist Assist Level: Supervision or verbal cues, Set up      Upper Body Dressing/Undressing Upper body dressing   What is the patient wearing?: Pull over shirt/dress     Pull over shirt/dress - Perfomed by patient: Thread/unthread right sleeve, Thread/unthread left sleeve, Put head through opening, Pull shirt over trunk          Upper body assist Assist Level: Set up   Set up : To obtain clothing/put away  Lower Body Dressing/Undressing Lower body dressing Lower body dressing/undressing activity did not occur: N/A What is the patient wearing?: Pants, Underwear, Socks, Shoes Underwear - Performed by patient: Thread/unthread right underwear leg, Thread/unthread left underwear leg, Pull underwear up/down   Pants- Performed by patient: Thread/unthread right pants leg, Thread/unthread left pants leg, Pull pants up/down, Fasten/unfasten pants Pants- Performed by helper: Thread/unthread left pants leg, Fasten/unfasten pants Non-skid slipper socks- Performed by patient: Don/doff right sock, Don/doff left sock Non-skid slipper socks- Performed by helper: Don/doff right sock, Don/doff left sock Socks - Performed by patient: Don/doff right sock, Don/doff left sock   Shoes - Performed by patient: Don/doff right shoe, Don/doff left shoe, Fasten right, Fasten left Shoes - Performed by helper: Don/doff right shoe, Don/doff left shoe  Lower body assist Assist for lower body dressing: Supervision or verbal cues      Toileting Toileting Toileting activity did not occur: N/A Toileting steps completed by patient: Adjust clothing prior to  toileting, Performs perineal hygiene, Adjust clothing after toileting Toileting steps completed by helper: Adjust clothing after toileting Toileting Assistive Devices: Grab bar or rail  Toileting assist Assist level: Supervision or verbal cues   Transfers Chair/bed transfer Chair/bed transfer activity did not occur: Safety/medical concerns Chair/bed transfer method: Ambulatory Chair/bed transfer assist level: Supervision or verbal cues Chair/bed transfer assistive device: Patent attorneyWalker     Locomotion Ambulation     Max distance: 150 ft  Assist level: Supervision or verbal cues   Wheelchair Wheelchair activity did not occur: N/A Type: Manual Max wheelchair distance: 100 Assist Level: Touching or steadying assistance (Pt > 75%)  Cognition Comprehension Comprehension assist level: Follows basic conversation/direction with extra time/assistive device  Expression Expression assist level: Expresses basic needs/ideas: With no assist  Social Interaction Social Interaction assist level: Interacts appropriately with others - No medications needed.  Problem Solving Problem solving assist level: Solves basic 90% of the time/requires cueing < 10% of the time  Memory Memory assist level: Recognizes or recalls 75 - 89% of the time/requires cueing 10 - 24% of the time  Medical Problem List and Plan: 1. 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 -continue PT, OT, SLP  -dc home today  -Patient to see Rehab MD in the office for transitional care encounter in 1-2 weeks.  2. DVT Prophylaxis/Anticoagulation: Eliquis 3. Pain Management: Flexeril and Ultram as needed. Monitor mental status 4. Mood: Zoloft 25 mg daily, Seroquel 12.5 mg daily at bedtime 5. Neuropsych: This patient iscapable of making decisions on hisown behalf. 6. Skin/Wound Care: Routine skin checks 7. Fluids/Electrolytes/Nutrition: Routine I&O with  follow-up chemistries reviewed 8.Atrial fibrillation. Cardiac rate controlled at present Vitals:   03/01/17 1344 03/02/17 0511  BP: 112/77 (!) 153/79  Pulse: 79 64  Resp: 17 16  Temp: 97.8 F (36.6 C) 97.8 F (36.6 C)   9.Escherichia coli UTI. Completed course of Rocephin.    -f/u urine culture neg 10.Hyperlipidemia. Lipitor 11.BPH. Pro scar 5 mg daily.    Continue flomax bid  -I/O cath prn---pt/family familiar---likely once or twice dailhy  -outpt urology follow up  -dc urecholine-     LOS (Days) 15 A FACE TO FACE EVALUATION WAS PERFORMED  Ranelle OysterSWARTZ,ZACHARY T, MD 03/02/2017 8:54 AM

## 2017-03-02 NOTE — Progress Notes (Signed)
Social Work Patient ID: Gregory BailGeorge Thomas [name], male   DOB: Apr 21, 1934, 83 y.o.   MRN: [id number]   Merinda Victorino, Darden DatesJennifer C, LCSW Social Worker Signed   Patient Care Conference Date of Service: 03/02/2017  6:47 AM      Hide copied text Hover for attribution information Inpatient RehabilitationTeam Conference and Plan of Care Update Date: 02/27/2017   Time: 2:30 PM      Patient Name: Gregory BailGeorge Thomas [name]      Medical Record Number: [id number]  Date of Birth: Apr 21, 1934 Sex: Male         Room/Bed: 4W08C/4W08C-01 Payor Info: Payor: MEDICARE / Plan: MEDICARE PART A AND B / Product Type: *No Product type* /     Admitting Diagnosis: CVA  Admit Date/Time:  02/15/2017  5:15 PM Admission Comments: No comment available    Primary Diagnosis:  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 Left shoulder pain 02/17/2017  . 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 BPH (benign prostatic hyperplasia) 05/01/2016  . Paroxysmal atrial fibrillation (HCC) 05/01/2016  . Hyperlipidemia, unspecified 05/01/2016      Expected Discharge Date: Expected Discharge Date: 03/02/17   Team Members Present: Physician leading conference: Dr. Faith RogueZachary Swartz Social Worker Present: Staci AcostaJenny Nikalas Bramel, LCSW Nurse Present: Other (comment) Clydie Braun(Karen Liliane ShiWinter, RN) PT Present: Aleda GranaVictoria Miller, PT OT Present: Callie FieldingKatie Pittman, OT SLP Present: Reuel DerbyHappi Overton, SLP PPS Coordinator present : Tora DuckMarie Noel, RN, CRRN       Current Status/Progress Goal Weekly Team Focus  Medical     improving memory and cognition, beginning to empty bladder  improve bladder emptying  see above   Bowel/Bladder     continent of bowel and bladder requires in and out cath hs LBM 77-23-18   Remain continent of bowel and bladder, maintain regular bladder and bowel  pattern  Assist with tolieting neede, continue to encourage double voiding   Swallow/Nutrition/ Hydration               ADL's     Min A bathing at shower level, supervision UB dressing, Min A LB dressing, Min A bathroom transfers at ambulatory level with RW  supervision/set up A  self care retraining, functional transfers, balance, cognitive remediation, pt/family education   Mobility     supervision for ambulation with RW, impaired cognition & safety awareness  supervision overall  pt/family education, d/c planning, balance, coordination, transfers, gait, stair negotiation, cognitive remediation   Communication               Safety/Cognition/ Behavioral Observations   Approaching Min A   Min A  problem solving, recall, attention, awareness   Pain     pain managed with prn ultram  Pain <3  Assess pain q shfit and prn   Skin     facial brusing, hemtoma to right side of forehead,  no new skin issues  Assess skin q shift and prn     Rehab Goals Patient on target to meet rehab goals: Yes Rehab Goals Revised: none *See Care Plan and progress notes for long and short-term goals.      Barriers to Discharge   Current Status/Progress Possible Resolutions Date Resolved   Physician     Medical stability;Neurogenic Bowel & Bladder        continue voiding trial, oob to void. ?  consider I/O caths at home      Nursing                 PT                    OT                 SLP            SW  Pt's family has worked out Building control surveyor24/7 supervision.      03-01-17      Discharge Planning/Teaching Needs:  Pt will go home with 24/7 family support.  Dtr and son were present for family education.   Team Discussion:  Pt needing in/out caths.  Dr. Riley KillSwartz has started urecholine to see if that helps.  Pt with no pain and is continent.  ST talked with dtr about the importance of 24/7 supervision.  She feels pt is close to baseline in re: to his memory.  Pt is supervision with ambulation due to safety and  memory, with left inattention.  OT is working on pt's left inattention and feels this is improving.  Pt is steady A in shower due to risk of falling.  Revisions to Treatment Plan:  none    Continued Need for Acute Rehabilitation Level of Care: The patient requires daily medical management by a physician with specialized training in physical medicine and rehabilitation for the following conditions: Daily direction of a multidisciplinary physical rehabilitation program to ensure safe treatment while eliciting the highest outcome that is of practical value to the patient.: Yes Daily medical management of patient stability for increased activity during participation in an intensive rehabilitation regime.: Yes Daily analysis of laboratory values and/or radiology reports with any subsequent need for medication adjustment of medical intervention for : Neurological problems;Urological problems   Nadege Carriger, Vista DeckJennifer Capps 03/02/2017, 6:47 AM

## 2017-03-02 NOTE — Progress Notes (Signed)
Patient rested well during the night.  No s/s of distress noted.  Patient safety maintained and denies needs/concerns at this time.

## 2017-03-02 NOTE — Patient Care Conference (Signed)
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 Left shoulder pain 02/17/2017  . 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 BPH (benign prostatic hyperplasia) 05/01/2016  . Paroxysmal atrial fibrillation (HCC) 05/01/2016  . Hyperlipidemia, unspecified 05/01/2016    Expected Discharge Date: Expected Discharge Date: 03/02/17  Team Members Present: Physician leading conference: Dr. Faith RogueZachary Swartz Social Worker Present: Staci AcostaJenny Palmyra Rogacki, LCSW Nurse Present: Other (comment) Clydie Braun(Karen Liliane ShiWinter, RN) PT Present: Aleda GranaVictoria Miller, PT OT Present: Callie FieldingKatie Pittman, OT SLP Present: Reuel DerbyHappi Overton, SLP PPS Coordinator present : Tora DuckMarie Noel, RN, CRRN     Current Status/Progress Goal Weekly Team Focus  Medical   improving memory and cognition, beginning to empty bladder  improve bladder emptying  see above   Bowel/Bladder   continent of bowel and bladder requires in and out cath hs LBM 77-23-18   Remain continent of bowel and bladder, maintain regular bladder and bowel pattern  Assist with tolieting neede, continue to encourage double voiding   Swallow/Nutrition/ Hydration             ADL's   Min A bathing at shower level, supervision UB dressing, Min A LB dressing, Min A bathroom transfers at ambulatory level with RW  supervision/set up A  self  care retraining, functional transfers, balance, cognitive remediation, pt/family education   Mobility   supervision for ambulation with RW, impaired cognition & safety awareness  supervision overall  pt/family education, d/c planning, balance, coordination, transfers, gait, stair negotiation, cognitive remediation   Communication             Safety/Cognition/ Behavioral Observations  Approaching Min A   Min A  problem solving, recall, attention, awareness   Pain   pain managed with prn ultram  Pain <3  Assess pain q shfit and prn   Skin   facial brusing, hemtoma to right side of forehead,  no new skin issues  Assess skin q shift and prn    Rehab Goals Patient on target to meet rehab goals: Yes Rehab Goals Revised: none *See Care Plan and progress notes for long and short-term goals.     Barriers to Discharge  Current Status/Progress Possible Resolutions Date Resolved   Physician    Medical stability;Neurogenic Bowel & Bladder        continue voiding trial, oob to void. ?consider I/O caths at home      Nursing                  PT                    OT                  SLP                SW  Pt's family has worked out Building control surveyor24/7 supervision.      03-01-17    Discharge Planning/Teaching Needs:  Pt will go home with 24/7 family support.  Dtr and son were present for family education.   Team Discussion:  Pt needing in/out caths.  Dr. Riley KillSwartz has started urecholine to see if that helps.  Pt with no pain and is continent.  ST talked with dtr about the importance of 24/7 supervision.  She feels pt is close to baseline in re: to his memory.  Pt is supervision with ambulation due to safety and memory, with left inattention.  OT is working on pt's left inattention and feels this is improving.  Pt is steady A in shower due to risk of falling.  Revisions to Treatment Plan:  none    Continued Need for Acute Rehabilitation Level of Care: The patient requires daily medical management by a  physician with specialized training in physical medicine and rehabilitation for the following conditions: Daily direction of a multidisciplinary physical rehabilitation program to ensure safe treatment while eliciting the highest outcome that is of practical value to the patient.: Yes Daily medical management of patient stability for increased activity during participation in an intensive rehabilitation regime.: Yes Daily analysis of laboratory values and/or radiology reports with any subsequent need for medication adjustment of medical intervention for : Neurological problems;Urological problems  Zylie Mumaw, Vista DeckJennifer Capps 03/02/2017, 6:47 AM

## 2017-03-02 NOTE — Progress Notes (Signed)
Social Work Patient ID: [name], male   DOB: 1933-10-13, 83 y.o.   MRN: [id number]   CSW met with pt and his son on 02-27-17 to update them on team conference discussion and then spoke with pt's dtr to do the same.  They are planning to take pt back to his home with family members taking turns to be with him at home.  Dtr is also looking into a caregiver for a few hours a day at the [place].  CSW arranged [place] for pt and son plans to buy pt a shower seat after talking with OT.  CSW remains available to assist as needed.  Pt is still on [facility] for 03-02-17 d/c.

## 2017-03-02 NOTE — Progress Notes (Signed)
Social Work Discharge Note  The overall goal for the admission was met for:   Discharge location: Yes - pt's home with dtr and other family members to provide 24/7 supervision  Length of Stay: Yes - 15 days  Discharge activity level: Yes - supervision  Home/community participation: Yes  Services provided included: MD, RD, PT, OT, SLP, RN, Pharmacy and SW  Financial Services: Medicare and Other: VA benefits  Follow-up services arranged: Home Health: PT/OT with [facility] , DME: shower chair - family plans to obtain and Patient/Family has no preference for HH/DME agencies  Comments (or additional information): Pt to d/c with his family to provide 24/7 supervision.  Dtr looking into aide from the [place].  Pt has made good progress.  Family received family education.    Patient/Family verbalized understanding of follow-up arrangements: Yes  Individual responsible for coordination of the follow-up plan: pt with his dtr, Sharyn Lull  Confirmed correct DME delivered: Trey Sailors 03/02/2017    Julie-Ann Vanmaanen, Silvestre Mesi

## 2017-03-04 NOTE — Discharge Summary (Signed)
NAME:  [name], [name]             ACCOUNT NO.:  [id number]  MEDICAL RECORD NO.:  [id number]  LOCATION:  [id number]                        FACILITY:  [facility]  PHYSICIAN:  Ranelle OysterZachary T. Swartz, M.D.DATE OF BIRTH:  1934/01/05  DATE OF ADMISSION:  02/15/2017 DATE OF DISCHARGE:  03/02/2017                              DISCHARGE SUMMARY   DISCHARGE DIAGNOSES: 1. 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 2. Pain management. 3. Atrial fibrillation. 4. Escherichia coli urinary tract infection, resolved. 5. Hyperlipidemia. 6. Benign prostatic hypertrophy.  HISTORY OF PRESENT ILLNESS:  This is an 83 year old right-handed male with history of CVA, no residual and atrial fibrillation, on Eliquis. Lives independently alone, with assistive device.  Presented on February 13, 2017, with altered mental status as well as chills and a fall.  CT of cervical spine negative for acute changes.  There was a right frontal scalp hematoma.  CT angiogram of head and neck showed no emergent large vessel occlusion.  CT of cervical spine negative.  MRI of the brain showed acute small right scattered MCA and posterior watershed territory infarctions.  The patient did not receive tPA.  EEG showed no seizure activity, remained on Eliquis for CVA prophylaxis.  Rocephin for E. coli urinary tract infection.  Physical and occupational therapy ongoing. The patient was admitted for comprehensive rehab program.  PAST MEDICAL HISTORY:  See discharge diagnoses.  SOCIAL HISTORY:  Lives alone.  Independent prior to admission.  FUNCTIONAL STATUS UPON ADMISSION TO REHAB SERVICES:  Minimal assist 80 feet rolling walker, minimal guard sit to stand, min to mod assist activities of daily living.  PHYSICAL EXAMINATION:  VITAL SIGNS:  Blood pressure 160/75, pulse 59, temperature 98, and respirations 20. GENERAL:  This was an alert male, provides name, age as well as follows commands, oriented to  place, month, and year.  Fair awareness of deficits.  EOMs intact. NECK:  Supple, nontender.  No JVD. CARDIAC:  Rate controlled. ABDOMEN:  Soft, nontender.  Good bowel sounds. LUNGS:  Clear to auscultation without wheeze.  REHABILITATION HOSPITAL COURSE:  The patient was admitted to Inpatient Rehab Services with therapies initiated on a 3-hour daily basis, consisting of physical therapy, occupational therapy, and rehabilitation nursing as well as speech therapy.  The following issues were addressed during the patient's rehabilitation stay.  Pertaining to Mr. [name]'s right MCA posterior watershed territory infarctions, remained stable. He continued on Eliquis for atrial fibrillation, cardiac rate controlled, blood pressures monitored, no orthostasis.  He had completed a course of Rocephin for E. coli urinary tract infection.  BPH, with Proscar as well as Flomax, I and O catheterizations as needed, likely once to twice daily at the time of discharge, outpatient Urology followup had been arranged.  The patient received weekly collaborative interdisciplinary team conferences to discuss estimated length of stay, family teaching, any barriers to his discharge.  The patient demonstrated improved balance to complete sit to stand from the toilet with close supervision.  He could ambulate extended distances with an assistive device setup with supervision dressing and grooming.  He was advised no driving.  The patient encouraged overall with his progress and plan was discharge to home.  DISCHARGE  MEDICATIONS: 1. Eliquis 5 mg p.o. b.i.d. 2. Aspirin 81 mg p.o. daily. 3. Lipitor 80 mg p.o. daily. 4. Vitamin D 1000 units daily. 5. Flexeril 5 mg p.o. t.i.d. as needed. 6. Colace 100 mg p.o. daily as needed. 7. Ferrous sulfate 325 mg p.o. daily. 8. Proscar 5 mg p.o. daily. 9. Prilosec 20 mg p.o. daily. 10.Seroquel 0.5 mg at bedtime. 11.Zoloft 25 mg p.o. daily. 12.Flomax 0.4 mg  b.i.d.  DISCHARGE INSTRUCTIONS:  His diet was regular.  The patient would follow up with Dr. Faith RogueZachary Swartz at the Outpatient Rehab Service office as advised and Dr. Roda ShuttersXu, Neurology Services.  [facility].  Special instructions, no driving.     [name], P.A.   ______________________________ Ranelle OysterZachary T. Swartz, M.D.    DA/MEDQ  D:  03/04/2017  T:  03/04/2017  Job:  [id number]  cc:   [facility] Ranelle OysterZachary T. Swartz, M.D. Dr. Roda ShuttersXu

## 2017-03-04 NOTE — Discharge Summary (Signed)
Discharge summary job # [id number]

## 2017-03-05 DIAGNOSIS — N400 Enlarged prostate without lower urinary tract symptoms: Secondary | ICD-10-CM | POA: Diagnosis not present

## 2017-03-05 DIAGNOSIS — I4891 Unspecified atrial fibrillation: Secondary | ICD-10-CM | POA: Diagnosis not present

## 2017-03-05 DIAGNOSIS — N3000 Acute cystitis without hematuria: Secondary | ICD-10-CM | POA: Diagnosis not present

## 2017-03-05 DIAGNOSIS — H5462 Unqualified visual loss, left eye, normal vision right eye: Secondary | ICD-10-CM | POA: Diagnosis not present

## 2017-03-05 DIAGNOSIS — I69398 Other sequelae of cerebral infarction: Secondary | ICD-10-CM | POA: Diagnosis not present

## 2017-03-05 DIAGNOSIS — B9620 Unspecified Escherichia coli [E. coli] as the cause of diseases classified elsewhere: Secondary | ICD-10-CM | POA: Diagnosis not present

## 2017-03-06 ENCOUNTER — Encounter: Payer: Self-pay | Admitting: Emergency Medicine

## 2017-03-06 ENCOUNTER — Emergency Department: Payer: Medicare Other

## 2017-03-06 ENCOUNTER — Emergency Department
Admission: EM | Admit: 2017-03-06 | Discharge: 2017-03-06 | Disposition: A | Discharge status: Home / Self Care | Payer: Medicare Other | Attending: Emergency Medicine | Admitting: Emergency Medicine

## 2017-03-06 ENCOUNTER — Telehealth: Payer: Self-pay | Admitting: *Deleted

## 2017-03-06 DIAGNOSIS — H538 Other visual disturbances: Secondary | ICD-10-CM | POA: Insufficient documentation

## 2017-03-06 DIAGNOSIS — Z8673 Personal history of transient ischemic attack (TIA), and cerebral infarction without residual deficits: Secondary | ICD-10-CM | POA: Insufficient documentation

## 2017-03-06 DIAGNOSIS — Z7901 Long term (current) use of anticoagulants: Secondary | ICD-10-CM | POA: Insufficient documentation

## 2017-03-06 DIAGNOSIS — I480 Paroxysmal atrial fibrillation: Secondary | ICD-10-CM | POA: Insufficient documentation

## 2017-03-06 DIAGNOSIS — Z79899 Other long term (current) drug therapy: Secondary | ICD-10-CM | POA: Diagnosis not present

## 2017-03-06 DIAGNOSIS — I4891 Unspecified atrial fibrillation: Secondary | ICD-10-CM | POA: Insufficient documentation

## 2017-03-06 DIAGNOSIS — Z96642 Presence of left artificial hip joint: Secondary | ICD-10-CM | POA: Diagnosis not present

## 2017-03-06 DIAGNOSIS — S299XXA Unspecified injury of thorax, initial encounter: Secondary | ICD-10-CM | POA: Diagnosis not present

## 2017-03-06 LAB — BASIC METABOLIC PANEL
Anion gap: 9 (ref 5–15)
BUN: 15 mg/dL (ref 6–20)
CHLORIDE: 105 mmol/L (ref 101–111)
CO2: 27 mmol/L (ref 22–32)
CREATININE: 0.96 mg/dL (ref 0.61–1.24)
Calcium: 9.1 mg/dL (ref 8.9–10.3)
GFR calc Af Amer: >60 mL/min (ref >60)
GFR calc non Af Amer: >60 mL/min (ref >60)
Glucose, Bld: 95 mg/dL (ref 65–99)
POTASSIUM: 4.1 mmol/L (ref 3.5–5.1)
Sodium: 141 mmol/L (ref 135–145)

## 2017-03-06 LAB — URINALYSIS, COMPLETE (UACMP) WITH MICROSCOPIC
BACTERIA UA: NONE SEEN
BILIRUBIN URINE: NEGATIVE
Glucose, UA: NEGATIVE mg/dL
Hgb urine dipstick: NEGATIVE
KETONES UR: NEGATIVE mg/dL
LEUKOCYTES UA: NEGATIVE
Nitrite: NEGATIVE
PH: 7 (ref 5.0–8.0)
Protein, ur: NEGATIVE mg/dL
SPECIFIC GRAVITY, URINE: 1.008 (ref 1.005–1.030)

## 2017-03-06 LAB — CBC
HEMATOCRIT: 38.6 % — AB (ref 40.0–52.0)
Hemoglobin: 12.8 g/dL — ABNORMAL LOW (ref 13.0–18.0)
MCH: 28.9 pg (ref 26.0–34.0)
MCHC: 33.2 g/dL (ref 32.0–36.0)
MCV: 86.8 fL (ref 80.0–100.0)
PLATELETS: 362 10*3/uL (ref 150–440)
RBC: 4.45 MIL/uL (ref 4.40–5.90)
RDW: 16.4 % — AB (ref 11.5–14.5)
WBC: 7.3 10*3/uL (ref 3.8–10.6)

## 2017-03-06 NOTE — ED Provider Notes (Signed)
[facility] Emergency Department Provider Note  ____________________________________________   First MD Initiated Contact with Patient 03/06/17 1858     (approximate)  I have reviewed the triage vital signs and the nursing notes.   HISTORY  Chief Complaint Blurred Vision    HPI [name] is a 83 y.o. male who comes to the emergency department with 1 day of intermittent blurred vision. He describes the vision as "off". He denies double vision. He does see some floaters. Earlier this month she was admitted to our hospital with a watershed right MCA infarct with left visual deficits. He was admitted to the hospital and then transferred to rehabilitation. He was discharged home from rehabilitation 9 days ago and has done well until today. He denies fevers or chills. He denies chest pain or shortness of breath. He is diapered dependent and has had multiple UTIs. His symptoms seemed to be slowly improving. There is an insidious onset. Nothing in particular seems to make them better or worse.     Past Medical History:  Diagnosis Date  . A-fib (HCC)   . GERD (gastroesophageal reflux disease)   . Hypercholesteremia   . Stroke [facility](HCC)     Patient Active Problem List   Diagnosis Date Noted  . Left shoulder pain 02/17/2017  . 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 BPH (benign prostatic hyperplasia) 05/01/2016  . Paroxysmal atrial fibrillation (HCC) 05/01/2016  . Hyperlipidemia, unspecified 05/01/2016    Past Surgical History:  Procedure Laterality Date  . TOTAL HIP ARTHROPLASTY Left     Prior to Admission medications   Medication Sig Start Date End Date Taking? Authorizing Provider  apixaban (ELIQUIS) 5 MG TABS tablet Take 1 tablet (5 mg total) by mouth 2 (two) times daily. 03/02/17   Love, Evlyn KannerPamela S, PA-C  aspirin EC 81 MG tablet Take 81 mg by mouth daily.    [provider]  atorvastatin  (LIPITOR) 80 MG tablet Take 1 tablet (80 mg total) by mouth daily. 03/02/17   Love, Evlyn KannerPamela S, PA-C  Cholecalciferol 1000 units TBDP Take 1 tablet by mouth daily.    [provider]  cyclobenzaprine (FLEXERIL) 5 MG tablet Take 1 tablet (5 mg total) by mouth 3 (three) times daily as needed for muscle spasms. 03/02/17   Love, Evlyn KannerPamela S, PA-C  docusate sodium (COLACE) 100 MG capsule Take 1 capsule (100 mg total) by mouth daily as needed for mild constipation. 03/02/17   Love, Evlyn KannerPamela S, PA-C  ferrous sulfate 325 (65 FE) MG tablet Take 1 tablet (325 mg total) by mouth daily with breakfast. 03/02/17   Love, Evlyn KannerPamela S, PA-C  finasteride (PROSCAR) 5 MG tablet Take 1 tablet (5 mg total) by mouth daily. 03/02/17   Love, Evlyn KannerPamela S, PA-C  Menthol-Methyl Salicylate (MUSCLE RUB) 10-15 % CREA Apply 1 application topically as needed for muscle pain. To neck and shoulder 03/02/17   Love, Evlyn Kanneramela S, PA-C  omeprazole (PRILOSEC) 20 MG capsule Take 20 mg by mouth daily.    [provider]  QUEtiapine (SEROQUEL) 25 MG tablet Take 0.5 tablets (12.5 mg total) by mouth at bedtime. 03/02/17   Love, Evlyn KannerPamela S, PA-C  sertraline (ZOLOFT) 25 MG tablet Take 1 tablet (25 mg total) by mouth daily. 03/02/17   Love, Evlyn KannerPamela S, PA-C  tamsulosin (FLOMAX) 0.4 MG CAPS capsule Take 1 capsule (0.4 mg total) by mouth 2 (two) times daily. 03/02/17   Jacquelynn CreeLove, Pamela S, PA-C    Allergies  Patient has no known allergies.  Family History  Problem Relation Age of Onset  . Hypertension Father     Social History Social History  Substance Use Topics  . Smoking status: Never Smoker  . Smokeless tobacco: Never Used  . Alcohol use No    Review of Systems Constitutional: No fever/chills Eyes: Positive visual changes. ENT: No sore throat. Cardiovascular: Denies chest pain. Respiratory: Denies shortness of breath. Gastrointestinal: No abdominal pain.  No nausea, no vomiting.  No diarrhea.  No constipation. Genitourinary: Negative for  dysuria. Musculoskeletal: Negative for back pain. Skin: Negative for rash. Neurological: Negative for headaches, focal weakness or numbness.   ____________________________________________   PHYSICAL EXAM:  VITAL SIGNS: ED Triage Vitals  Enc Vitals Group     BP 03/06/17 1723 (!) 144/79     Pulse Rate 03/06/17 1723 61     Resp 03/06/17 1723 20     Temp 03/06/17 1723 98.5 F (36.9 C)     Temp Source 03/06/17 1723 Oral     SpO2 03/06/17 1723 98 %     Weight 03/06/17 1726 174 lb (78.9 kg)     Height --      Head Circumference --      Peak Flow --      Pain Score --      Pain Loc --      Pain Edu? --      Excl. in GC? --     Constitutional:Alert and oriented 4 appropriate cooperative speaks full clear sentences no diaphoresis Eyes: PERRL EOMI.Papilledema noted on funduscopy 20/50 bilaterally Head: Atraumatic. Nose: No congestion/rhinnorhea. Mouth/Throat: No trismus Neck: No stridor.   Cardiovascular: Bradycardic rate, regular rhythm. Grossly normal heart sounds.  Good peripheral circulation. Respiratory: Normal respiratory effort.  No retractions. Lungs CTAB and moving good air Gastrointestinal: Soft nontender Musculoskeletal: No lower extremity edema   Neurologic:  Normal speech and language.  Cranial nerves II through XII intact No pronator drift 5 out of 5 grips biceps triceps hip flexion and hip extension plantar flexion dorsiflexion Normal finger-nose-finger Sensation intact to light touch throughout Ambulate with steady gait Skin:  Skin is warm, dry and intact. No rash noted. Psychiatric: Mood and affect are normal. Speech and behavior are normal.    ____________________________________________  ____________________________________________   LABS (all labs ordered are listed, but only abnormal results are displayed)  Labs Reviewed  CBC - Abnormal; Notable for the following:       Result Value   Hemoglobin 12.8 (*)    HCT 38.6 (*)    RDW 16.4 (*)     All other components within normal limits  URINALYSIS, COMPLETE (UACMP) WITH MICROSCOPIC - Abnormal; Notable for the following:    Color, Urine YELLOW (*)    APPearance CLEAR (*)    Squamous Epithelial / LPF 0-5 (*)    All other components within normal limits  BASIC METABOLIC PANEL  No evidence of UTI labs unremarkable __________________________________________  EKG  ED ECG REPORT I, Merrily BrittleNeil Thaddeaus Monica, the attending physician, personally viewed and interpreted this ECG.  Date: 03/06/2017 Rate: 61 Rhythm: normal sinus rhythm QRS Axis: Leftward Intervals: normal ST/T Wave abnormalities: normal Narrative Interpretation: Borderline  ____________________________________________Head CT with chronic changes but no acute disease chest x-ray with no acute disease_________________   PROCEDURES  Procedure(s) performed: o  Procedures  Critical Care performed: no  Observation: no ____________________________________________   INITIAL IMPRESSION / ASSESSMENT AND PLAN / ED COURSE  Pertinent labs & imaging results that were available  during my care of the patient were reviewed by me and considered in my medical decision making (see chart for details).On arrival the patient is well-appearing with a normal neurological exam. Unclear etiology of his intermittent visual changes but at this point he has 20/50 vision bilaterally. Head CT is abnormal but no acute changes. He has follow-up with his rehabilitation doctor tomorrow and with ophthalmology on Monday. Family understands to return for any concerns. He is discharged home in good condition.      ____________________________________________   FINAL CLINICAL IMPRESSION(S) / ED DIAGNOSES  Final diagnoses:  Blurred vision      NEW MEDICATIONS STARTED DURING THIS VISIT:  New Prescriptions   No medications on file     Note:  This document was prepared using Dragon voice recognition software and may include unintentional  dictation errors.     Merrily Brittle, MD 03/06/17 2019

## 2017-03-06 NOTE — ED Triage Notes (Signed)
Pt reports CVA on 02/13/17, came home on Friday from stroke rehab. Pt reports lightheaded, dizziness and blurred vision since being discharge on Friday, worse since last night.

## 2017-03-06 NOTE — Telephone Encounter (Signed)
Mr Erhardt's daughter called and said that her father has been having blurred vision and some light headed feelings.  She says that it started yesterday and feels worse today, but that it comes and goes.  She is asking if they should expect this or take him to the ED. Dr Riley KillSwartz is not in the office and I suggested she take him to the ED to be checked. I did give her the number for Dr Warren DanesXu's office and suggested she call there to ask neurology advice as well.  She will try to reach them and if not they will take him to be evaluated.

## 2017-03-06 NOTE — ED Notes (Signed)
Report off to sonja rn .  

## 2017-03-06 NOTE — ED Triage Notes (Signed)
Pt reports blurred vision and weakness for two days. Pt with a recent stroke back on 7/10.

## 2017-03-06 NOTE — Discharge Instructions (Signed)
Please keep your appointment with your rehabilitation physician tomorrow and with your ophthalmologist on Monday as scheduled. Return to the emergency department for any concerns.  It was a pleasure to take care of you today, and thank you for coming to our emergency department.  If you have any questions or concerns before leaving please ask the nurse to grab me and I'm more than happy to go through your aftercare instructions again.  If you were prescribed any opioid pain medication today such as Norco, Vicodin, Percocet, morphine, hydrocodone, or oxycodone please make sure you do not drive when you are taking this medication as it can alter your ability to drive safely.  If you have any concerns once you are home that you are not improving or are in fact getting worse before you can make it to your follow-up appointment, please do not hesitate to call 911 and come back for further evaluation.  Merrily BrittleNeil Chenita Ruda, MD  Results for orders placed or performed during the hospital encounter of 03/06/17  Basic metabolic panel  Result Value Ref Range   Sodium 141 135 - 145 mmol/L   Potassium 4.1 3.5 - 5.1 mmol/L   Chloride 105 101 - 111 mmol/L   CO2 27 22 - 32 mmol/L   Glucose, Bld 95 65 - 99 mg/dL   BUN 15 6 - 20 mg/dL   Creatinine, Ser 1.610.96 0.61 - 1.24 mg/dL   Calcium 9.1 8.9 - 09.610.3 mg/dL   GFR calc non Af Amer >60 >60 mL/min   GFR calc Af Amer >60 >60 mL/min   Anion gap 9 5 - 15  CBC  Result Value Ref Range   WBC 7.3 3.8 - 10.6 K/uL   RBC 4.45 4.40 - 5.90 MIL/uL   Hemoglobin 12.8 (L) 13.0 - 18.0 g/dL   HCT 04.538.6 (L) 40.940.0 - 81.152.0 %   MCV 86.8 80.0 - 100.0 fL   MCH 28.9 26.0 - 34.0 pg   MCHC 33.2 32.0 - 36.0 g/dL   RDW 91.416.4 (H) 78.211.5 - 95.614.5 %   Platelets 362 150 - 440 K/uL  Urinalysis, Complete w Microscopic  Result Value Ref Range   Color, Urine YELLOW (A) YELLOW   APPearance CLEAR (A) CLEAR   Specific Gravity, Urine 1.008 1.005 - 1.030   pH 7.0 5.0 - 8.0   Glucose, UA NEGATIVE  NEGATIVE mg/dL   Hgb urine dipstick NEGATIVE NEGATIVE   Bilirubin Urine NEGATIVE NEGATIVE   Ketones, ur NEGATIVE NEGATIVE mg/dL   Protein, ur NEGATIVE NEGATIVE mg/dL   Nitrite NEGATIVE NEGATIVE   Leukocytes, UA NEGATIVE NEGATIVE   RBC / HPF 0-5 0 - 5 RBC/hpf   WBC, UA 0-5 0 - 5 WBC/hpf   Bacteria, UA NONE SEEN NONE SEEN   Squamous Epithelial / LPF 0-5 (A) NONE SEEN   Mucous PRESENT    Ct Angio Head W Or Wo Contrast  Result Date: 02/13/2017 CLINICAL DATA:  Sudden onset nausea and vomiting. EXAM: CT ANGIOGRAPHY HEAD AND NECK TECHNIQUE: Multidetector CT imaging of the head and neck was performed using the standard protocol during bolus administration of intravenous contrast. Multiplanar CT image reconstructions and MIPs were obtained to evaluate the vascular anatomy. Carotid stenosis measurements (when applicable) are obtained utilizing NASCET criteria, using the distal internal carotid diameter as the denominator. CONTRAST:  75 cc Isovue 370 intravenous COMPARISON:  Head CT from earlier today.  CTA neck 04/15/2009 FINDINGS: CTA NECK FINDINGS Aortic arch: Normal diameter. No acute finding. Atherosclerotic calcification. Three vessel branching.  Right carotid system: Moderate calcified and noncalcified plaque at the common carotid bifurcation and ICA bulb without stenosis or ulceration. Negative for dissection. Left carotid system: Previous endarterectomy - left ICA stenosis and complex plaque seen previously is resolved and there is left neck scarring. Vessels are smooth and widely patent. Vertebral arteries: No brachiocephalic or proximal subclavian stenosis. The dominant left vertebral artery is smooth and diffusely patent to the dura. The non dominant right vertebral artery has a severe stenosis at the origin, with non visible lumen at this level. The vessel is smaller than prior and is subtly asymmetrically less dense today, attributed to underfilling. No suspected dissection. Skeleton: No acute or  aggressive finding. Other neck: No incidental mass or adenopathy. Upper chest: No acute finding. Review of the MIP images confirms the above findings CTA HEAD FINDINGS Anterior circulation: Symmetric carotid arteries. Moderate atherosclerotic plaque on the carotid siphons. No major branch occlusion or significant and reversible stenosis. No beading or aneurysm. Posterior circulation: Small less intensely opacified right vertebral artery with probable mid V4 segment stenosis. A right pica is not clearly seen, but stable from 2010. There is a large right AICA. Small distal basilar in the setting of fetal type PCA circulation. Venous sinuses: Patent Anatomic variants: Fetal type PCA. Delayed phase: No abnormal intracranial enhancement. Remote left MCA territory infarct. Review of the MIP images confirms the above findings IMPRESSION: 1. No emergent large vessel occlusion. 2. 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 3. Widely patent left carotid endarterectomy. 4. Moderate atherosclerotic plaque at the right cervical carotid bifurcation without flow limiting stenosis. Electronically Signed   By: Marnee Spring M.D.   On: 02/13/2017 10:46   Dg Neck Soft Tissue  Result Date: 02/16/2017 CLINICAL DATA:  Neck pain unresolved after ice was applied to it. Recent fall EXAM: NECK SOFT TISSUES - 1+ VIEW COMPARISON:  CT 02/13/2017 FINDINGS: There is no evidence of retropharyngeal soft tissue swelling or epiglottic enlargement. The cervical airway is unremarkable and no radio-opaque foreign body identified. Mild spondylitic changes in the visualized cervical spine. The patient is edentulous. IMPRESSION: Negative. Electronically Signed   By: Corlis Leak M.D.   On: 02/16/2017 13:03   Ct Head Wo Contrast  Result Date: 03/06/2017 CLINICAL DATA:  83 year old male with blurred vision lightheadedness. Initial encounter. EXAM: CT HEAD WITHOUT CONTRAST TECHNIQUE:  Contiguous axial images were obtained from the base of the skull through the vertex without intravenous contrast. COMPARISON:  None. FINDINGS: Brain: No intracranial hemorrhage or CT evidence of large acute infarct. Remote left parietal lobe and frontal lobe infarct. Recent MR detected small infarcts throughout the right middle cerebral artery distribution are not well delineated on the present CT. Chronic microvascular changes. Global atrophy without hydrocephalus. No intracranial mass lesion noted on this unenhanced exam. Vascular: Vascular calcifications Skull: No skull fracture Sinuses/Orbits: No acute orbital abnormality. Mild opacification ethmoid sinuses and frontal sinuses. Other: New 1.3 cm subcutaneous rounded process anterior to the right frontal sinus probably a small hematoma without underlying fracture. IMPRESSION: No skull fracture or intracranial hemorrhage. Suspect new 1.3 cm subcutaneous hematoma anterior to the right frontal sinus. Remote left parietal-frontal lobe infarcts. Recent MR detected small infarcts posterior right middle cerebral artery distribution not well delineated on present exam. Chronic microvascular changes. Atrophy. Mild opacification ethmoid sinuses and frontal sinuses. Electronically Signed   By: Lacy Duverney M.D.   On: 03/06/2017 18:09   Ct Head Wo Contrast  Result Date:  02/13/2017 CLINICAL DATA:  Unwitnessed fall.  Nausea and vomiting EXAM: CT HEAD WITHOUT CONTRAST CT CERVICAL SPINE WITHOUT CONTRAST TECHNIQUE: Multidetector CT imaging of the head and cervical spine was performed following the standard protocol without intravenous contrast. Multiplanar CT image reconstructions of the cervical spine were also generated. COMPARISON:  Head CT April 20, 2016 FINDINGS: CT HEAD FINDINGS Brain: There is mild diffuse atrophy, stable. There is no intracranial mass, hemorrhage, extra-axial fluid collection, or midline shift. There is evidence of a prior infarct in the left  parietal lobe, stable. There is also evidence of a prior infarct in the superior posterior left frontal lobe near the junction with the left parietal lobe, stable. There is patchy small vessel disease throughout the centra semiovale bilaterally. There is evidence of a prior small lacunar infarct in the posterior left lentiform nucleus. There is note new gray-white compartment lesion. No acute infarct evident. Vascular: There is no appreciable hyperdense vessel. There is mild calcification in the left carotid siphon. Skull: The bony calvarium appears intact. There is a small right frontal scalp hematoma. Sinuses/Orbits: There is opacification of multiple ethmoid air cells bilaterally. There is mucosal thickening in the inferior frontal sinus regions. There is a concha bullosa on each side, an anatomic variant. Other paranasal sinuses are clear. Orbits appear symmetric bilaterally. Note that there is preseptal soft tissue swelling over the right eye. Other: There is mild mastoid air cell disease on the right inferiorly. Mastoids elsewhere are clear. CT CERVICAL SPINE FINDINGS Alignment: There is no appreciable spondylolisthesis. Skull base and vertebrae: Skull base and craniocervical junction regions appear within normal limits. There is no demonstrable fracture. There are no blastic or lytic bone lesions. Soft tissues and spinal canal: Prevertebral soft tissues and predental space regions are normal. There is no paraspinous lesion. There is no cord or canal hematoma. Disc levels: There is predental space narrowing and spur formation consistent with localized osteoarthritic change. There is moderate disc space narrowing at C6-7. There is facet hypertrophy at multiple levels. There is no frank nerve root effacement. No disc extrusion or stenosis evident. Upper chest: Visualized upper lung regions clear. Other: There is calcification in both subclavian arteries. There is calcification in the right carotid artery.  IMPRESSION: CT head: Atrophy with supratentorial small vessel disease. Prior infarcts in the posterior left frontal lobe and left parietal lobe regions. Small prior infarct left lentiform nucleus. No acute infarct evident. No mass, hemorrhage, or extra-axial fluid collection. There is ethmoid sinus disease with opacification of multiple ethmoid sinuses. There is also mild inferior frontal sinus disease bilaterally. There is mild inferior left mastoid disease. There is a right frontal scalp hematoma. No fracture evident. There is preseptal soft tissue edema over the right eye. CT cervical spine: No evident fracture or spondylolisthesis. Osteoarthritic change noted at several levels. Areas of arterial vascular calcification noted. Critical Value/emergent results were called by telephone at the time of interpretation on 02/13/2017 at 9:43 am to Dr. Jene Every , who verbally acknowledged these results. Electronically Signed   By: Bretta Bang III M.D.   On: 02/13/2017 09:44   Ct Angio Neck W Or Wo Contrast  Result Date: 02/13/2017 CLINICAL DATA:  Sudden onset nausea and vomiting. EXAM: CT ANGIOGRAPHY HEAD AND NECK TECHNIQUE: Multidetector CT imaging of the head and neck was performed using the standard protocol during bolus administration of intravenous contrast. Multiplanar CT image reconstructions and MIPs were obtained to evaluate the vascular anatomy. Carotid stenosis measurements (when applicable) are obtained utilizing  NASCET criteria, using the distal internal carotid diameter as the denominator. CONTRAST:  75 cc Isovue 370 intravenous COMPARISON:  Head CT from earlier today.  CTA neck 04/15/2009 FINDINGS: CTA NECK FINDINGS Aortic arch: Normal diameter. No acute finding. Atherosclerotic calcification. Three vessel branching. Right carotid system: Moderate calcified and noncalcified plaque at the common carotid bifurcation and ICA bulb without stenosis or ulceration. Negative for dissection. Left  carotid system: Previous endarterectomy - left ICA stenosis and complex plaque seen previously is resolved and there is left neck scarring. Vessels are smooth and widely patent. Vertebral arteries: No brachiocephalic or proximal subclavian stenosis. The dominant left vertebral artery is smooth and diffusely patent to the dura. The non dominant right vertebral artery has a severe stenosis at the origin, with non visible lumen at this level. The vessel is smaller than prior and is subtly asymmetrically less dense today, attributed to underfilling. No suspected dissection. Skeleton: No acute or aggressive finding. Other neck: No incidental mass or adenopathy. Upper chest: No acute finding. Review of the MIP images confirms the above findings CTA HEAD FINDINGS Anterior circulation: Symmetric carotid arteries. Moderate atherosclerotic plaque on the carotid siphons. No major branch occlusion or significant and reversible stenosis. No beading or aneurysm. Posterior circulation: Small less intensely opacified right vertebral artery with probable mid V4 segment stenosis. A right pica is not clearly seen, but stable from 2010. There is a large right AICA. Small distal basilar in the setting of fetal type PCA circulation. Venous sinuses: Patent Anatomic variants: Fetal type PCA. Delayed phase: No abnormal intracranial enhancement. Remote left MCA territory infarct. Review of the MIP images confirms the above findings IMPRESSION: 1. No emergent large vessel occlusion. 2. 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 3. Widely patent left carotid endarterectomy. 4. Moderate atherosclerotic plaque at the right cervical carotid bifurcation without flow limiting stenosis. Electronically Signed   By: Marnee Spring M.D.   On: 02/13/2017 10:46   Ct Cervical Spine Wo Contrast  Result Date: 02/13/2017 CLINICAL DATA:  Unwitnessed fall.  Nausea and vomiting EXAM: CT HEAD  WITHOUT CONTRAST CT CERVICAL SPINE WITHOUT CONTRAST TECHNIQUE: Multidetector CT imaging of the head and cervical spine was performed following the standard protocol without intravenous contrast. Multiplanar CT image reconstructions of the cervical spine were also generated. COMPARISON:  Head CT April 20, 2016 FINDINGS: CT HEAD FINDINGS Brain: There is mild diffuse atrophy, stable. There is no intracranial mass, hemorrhage, extra-axial fluid collection, or midline shift. There is evidence of a prior infarct in the left parietal lobe, stable. There is also evidence of a prior infarct in the superior posterior left frontal lobe near the junction with the left parietal lobe, stable. There is patchy small vessel disease throughout the centra semiovale bilaterally. There is evidence of a prior small lacunar infarct in the posterior left lentiform nucleus. There is note new gray-white compartment lesion. No acute infarct evident. Vascular: There is no appreciable hyperdense vessel. There is mild calcification in the left carotid siphon. Skull: The bony calvarium appears intact. There is a small right frontal scalp hematoma. Sinuses/Orbits: There is opacification of multiple ethmoid air cells bilaterally. There is mucosal thickening in the inferior frontal sinus regions. There is a concha bullosa on each side, an anatomic variant. Other paranasal sinuses are clear. Orbits appear symmetric bilaterally. Note that there is preseptal soft tissue swelling over the right eye. Other: There is mild mastoid air cell disease on the right inferiorly. Mastoids  elsewhere are clear. CT CERVICAL SPINE FINDINGS Alignment: There is no appreciable spondylolisthesis. Skull base and vertebrae: Skull base and craniocervical junction regions appear within normal limits. There is no demonstrable fracture. There are no blastic or lytic bone lesions. Soft tissues and spinal canal: Prevertebral soft tissues and predental space regions are  normal. There is no paraspinous lesion. There is no cord or canal hematoma. Disc levels: There is predental space narrowing and spur formation consistent with localized osteoarthritic change. There is moderate disc space narrowing at C6-7. There is facet hypertrophy at multiple levels. There is no frank nerve root effacement. No disc extrusion or stenosis evident. Upper chest: Visualized upper lung regions clear. Other: There is calcification in both subclavian arteries. There is calcification in the right carotid artery. IMPRESSION: CT head: Atrophy with supratentorial small vessel disease. Prior infarcts in the posterior left frontal lobe and left parietal lobe regions. Small prior infarct left lentiform nucleus. No acute infarct evident. No mass, hemorrhage, or extra-axial fluid collection. There is ethmoid sinus disease with opacification of multiple ethmoid sinuses. There is also mild inferior frontal sinus disease bilaterally. There is mild inferior left mastoid disease. There is a right frontal scalp hematoma. No fracture evident. There is preseptal soft tissue edema over the right eye. CT cervical spine: No evident fracture or spondylolisthesis. Osteoarthritic change noted at several levels. Areas of arterial vascular calcification noted. Critical Value/emergent results were called by telephone at the time of interpretation on 02/13/2017 at 9:43 am to Dr. Jene Every , who verbally acknowledged these results. Electronically Signed   By: Bretta Bang III M.D.   On: 02/13/2017 09:44   Mr Brain Wo Contrast  Result Date: 02/13/2017 CLINICAL DATA:  Altered mental status, possible syncopal episode on toilet. History of stroke, atrial fibrillation on anticoagulation. EXAM: MRI HEAD WITHOUT CONTRAST TECHNIQUE: Multiplanar, multiecho pulse sequences of the brain and surrounding structures were obtained without intravenous contrast. COMPARISON:  CT HEAD February 13, 2017 at 0919 hours an MRI head July 08, 2012 FINDINGS: Mild motion degraded examination. BRAIN: Patchy reduced diffusion RIGHT temporal occipital lobe and to lesser extent RIGHT parietal lobe with low ADC values. Numerous foci of susceptibility artifact and predominately peripheral distribution. Patchy LEFT subarachnoid susceptibility artifact. Minimal peripheral LEFT temporal encephalomalacia. Similar LEFT frontotemporal lobe encephalomalacia. Moderate ventriculomegaly on the basis of global parenchymal brain volume loss. Patchy supratentorial white matter T2 hyperintensities exclusive of the aforementioned abnormality. Old LEFT basal ganglia hemorrhagic infarct. Old pontine lacunar infarcts. VASCULAR: Normal major intracranial vascular flow voids present at skull base. SKULL AND UPPER CERVICAL SPINE: No abnormal sellar expansion. No suspicious calvarial bone marrow signal. Craniocervical junction maintained. SINUSES/ORBITS: Mild paranasal sinus mucosal thickening. LEFT mastoid effusion. The included ocular globes and orbital contents are non-suspicious. OTHER: None. IMPRESSION: 1. Acute small scattered RIGHT MCA and posterior watershed territory infarcts. 2. Old LEFT MCA infarcts, old LEFT basal ganglia hemorrhagic infarct. 3. Extensive peripheral chronic micro hemorrhages and artifact and patchy LEFT superficial siderosis, findings consistent with amyloid angiopathy. 4. Moderate chronic small vessel ischemic disease. Electronically Signed   By: Awilda Metro M.D.   On: 02/13/2017 20:21   US Scrotum  Result Date: 02/13/2017 CLINICAL DATA:  Testicular swelling and pain. EXAM: SCROTAL ULTRASOUND DOPPLER ULTRASOUND OF THE TESTICLES TECHNIQUE: Complete ultrasound examination of the testicles, epididymis, and other scrotal structures was performed. Color and spectral Doppler ultrasound were also utilized to evaluate blood flow to the testicles. COMPARISON:  None. FINDINGS: Right testicle Measurements: 4.8 x 3.1  x 2.9 cm. No mass or microlithiasis  visualized. Increased vascularity is noted on Doppler suggesting orchitis. Left testicle Measurements: 4.9 x 2.5 x 2.2 cm. No mass or microlithiasis visualized. Right epididymis:  4 mm cyst is noted. Left epididymis:  4 mm cyst is noted. Hydrocele:  Mild right septated hydrocele is noted. Varicocele:  None visualized. Pulsed Doppler interrogation of both testes demonstrates normal low resistance arterial and venous waveforms bilaterally. IMPRESSION: Increased vascularity of right testicle is noted suggesting orchitis. Mild septated right hydrocele is noted as well. Electronically Signed   By: Lupita RaiderJames  Green Jr, M.D.   On: 02/13/2017 12:25   [place] Art/ven Flow Abd Pelv Doppler  Result Date: 02/13/2017 CLINICAL DATA:  Testicular swelling and pain. EXAM: SCROTAL ULTRASOUND DOPPLER ULTRASOUND OF THE TESTICLES TECHNIQUE: Complete ultrasound examination of the testicles, epididymis, and other scrotal structures was performed. Color and spectral Doppler ultrasound were also utilized to evaluate blood flow to the testicles. COMPARISON:  None. FINDINGS: Right testicle Measurements: 4.8 x 3.1 x 2.9 cm. No mass or microlithiasis visualized. Increased vascularity is noted on Doppler suggesting orchitis. Left testicle Measurements: 4.9 x 2.5 x 2.2 cm. No mass or microlithiasis visualized. Right epididymis:  4 mm cyst is noted. Left epididymis:  4 mm cyst is noted. Hydrocele:  Mild right septated hydrocele is noted. Varicocele:  None visualized. Pulsed Doppler interrogation of both testes demonstrates normal low resistance arterial and venous waveforms bilaterally. IMPRESSION: Increased vascularity of right testicle is noted suggesting orchitis. Mild septated right hydrocele is noted as well. Electronically Signed   By: Lupita RaiderJames  Green Jr, M.D.   On: 02/13/2017 12:25   Dg Chest Port 1 View  Result Date: 03/06/2017 CLINICAL DATA:  CVA.  Blurred vision.  Lightheadedness.  Dizziness. EXAM: PORTABLE CHEST 1 VIEW COMPARISON:   09/22/2014 FINDINGS: Normal heart size. Lungs clear. No pneumothorax. No pleural effusion. IMPRESSION: No active cardiopulmonary disease. Electronically Signed   By: Jolaine ClickArthur  Hoss M.D.   On: 03/06/2017 19:38   Dg Shoulder Left  Result Date: 02/17/2017 LEFT CLINICAL DATA: Left shoulder pain.  No known injury. EXAM: LEFT SHOULDER - 2+ VIEW COMPARISON:  None. FINDINGS: Mild degenerative changes in the [facility] with joint space narrowing and spurring. Subacromial spurring noted. Glenohumeral joint is maintained. No acute bony abnormality. Specifically, no fracture, subluxation, or dislocation. Soft tissues are intact. IMPRESSION: Degenerative changes in the left AC joint. No acute bony abnormality. Electronically Signed   By: Charlett NoseKevin  Dover M.D.   On: 02/17/2017 10:58

## 2017-03-06 NOTE — ED Notes (Signed)
Pt had stroke 7/10 and was in rehab.  Pt reports feeling dizzy, weak for 4 days.  No recent falls.  No headache.  Pt reports blurred vision,.   Pt alert.  Speech clear.  Family with pt.  md at bedside.

## 2017-03-07 ENCOUNTER — Encounter: Payer: Medicare Other | Attending: Physical Medicine & Rehabilitation | Admitting: Physical Medicine & Rehabilitation

## 2017-03-07 ENCOUNTER — Encounter: Payer: Self-pay | Admitting: Physical Medicine & Rehabilitation

## 2017-03-07 VITALS — BP 130/75 | HR 64

## 2017-03-07 DIAGNOSIS — I63511 Cerebral infarction due to unspecified occlusion or stenosis of right middle cerebral artery: Secondary | ICD-10-CM | POA: Diagnosis not present

## 2017-03-07 DIAGNOSIS — N390 Urinary tract infection, site not specified: Secondary | ICD-10-CM | POA: Diagnosis not present

## 2017-03-07 DIAGNOSIS — I4891 Unspecified atrial fibrillation: Secondary | ICD-10-CM | POA: Diagnosis not present

## 2017-03-07 DIAGNOSIS — K219 Gastro-esophageal reflux disease without esophagitis: Secondary | ICD-10-CM | POA: Diagnosis not present

## 2017-03-07 DIAGNOSIS — F329 Major depressive disorder, single episode, unspecified: Secondary | ICD-10-CM | POA: Diagnosis not present

## 2017-03-07 DIAGNOSIS — E7800 Pure hypercholesterolemia, unspecified: Secondary | ICD-10-CM | POA: Diagnosis not present

## 2017-03-07 DIAGNOSIS — F419 Anxiety disorder, unspecified: Secondary | ICD-10-CM | POA: Diagnosis not present

## 2017-03-07 DIAGNOSIS — N400 Enlarged prostate without lower urinary tract symptoms: Secondary | ICD-10-CM | POA: Insufficient documentation

## 2017-03-07 DIAGNOSIS — R42 Dizziness and giddiness: Secondary | ICD-10-CM | POA: Diagnosis not present

## 2017-03-07 DIAGNOSIS — H538 Other visual disturbances: Secondary | ICD-10-CM | POA: Insufficient documentation

## 2017-03-07 NOTE — Patient Instructions (Addendum)
PLEASE FEEL FREE TO CALL OUR OFFICE WITH ANY PROBLEMS OR QUESTIONS [phone]    STOP FLOMAX!!!  FOLLOW UP WITH YOUR EYE DOCTOR AS YOUR'E SCHEDULED TO DO.

## 2017-03-07 NOTE — Progress Notes (Signed)
Subjective:    Patient ID: [name], male    DOB: 1934/02/29, 83 y.o.   MRN: [id number]  HPI   Mr. Gregory DawleyMatherly is here in follow up of his right MCA infarct. He was discharged from rehab last week. He began develop blurred vision and dizziness a couple days ago. He went to the ED last night and work up was negative. "floaters" were mentioned. He is still having symptoms today.   He continues to require I/O caths,  1-2 x per day. He is empting his bowels. He denies sob, cp, cough.   Family thinks he may be anxious at times, especially when he's by himself.   PT/OT/SLP is coming to the house. His family is there most of the day and lives nearby.     Pain Inventory Average Pain 1 Pain Right Now 1 My pain is dull  In the last 24 hours, has pain interfered with the following? General activity 1 Relation with others 1 Enjoyment of life 1 What TIME of day is your pain at its worst? morning Sleep (in general) Fair  Pain is worse with: some activites Pain improves with: . Relief from Meds: 6  Mobility use a walker ability to climb steps?  yes do you drive?  no  Function retired  Neuro/Psych bladder control problems bowel control problems weakness trouble walking dizziness confusion depression anxiety loss of taste or smell  Prior Studies Any changes since last visit?  no  Physicians involved in your care Any changes since last visit?  no   Family History  Problem Relation Age of Onset  . Hypertension Father    Social History   Social History  . Marital status: Widowed    Spouse name: N/A  . Number of children: N/A  . Years of education: N/A   Social History Main Topics  . Smoking status: Never Smoker  . Smokeless tobacco: Never Used  . Alcohol use No  . Drug use: No  . Sexual activity: Not on file   Other Topics Concern  . Not on file   Social History Narrative   Lives by himself, independent at baseline   Past Surgical History:    Procedure Laterality Date  . TOTAL HIP ARTHROPLASTY Left    Past Medical History:  Diagnosis Date  . A-fib (HCC)   . GERD (gastroesophageal reflux disease)   . Hypercholesteremia   . Stroke [facility](HCC)    There were no vitals taken for this visit.  Opioid Risk Score:   Fall Risk Score:  `1  Depression screen PHQ 2/9  No flowsheet data found.   Review of Systems  Constitutional: Negative.   HENT: Negative.   Eyes: Negative.   Respiratory: Negative.   Cardiovascular: Negative.   Gastrointestinal: Negative.   Endocrine: Negative.   Genitourinary: Negative.   Musculoskeletal: Negative.   Skin: Negative.   Allergic/Immunologic: Negative.   Neurological: Negative.   Hematological: Negative.   Psychiatric/Behavioral: Negative.   All other systems reviewed and are negative.      Objective:   Physical Exam  HENT: bump over right eye present/decreased Head: Normocephalic.  Eyes: Pupils are equal, round, and reactive to light. Conjunctivaeand EOMare normal.  Neck: Normal range of motion. Neck supple. No tracheal deviationpresent. No thyromegalypresent.  Cardiovascular: RRR without murmur      Respiratory:  Normal effort   GI: Soft. Bowel sounds are normal. He exhibits no distension. There is no tenderness. There is no rebound GU: nomral  Neurological:  He is alert. He tracks to all fields with confrontation. ?mild nystagmus with right gaze (inconsistent). Able to read words near and at 6+ feet. Did not struggle with depth perception. Could touch his nose with eyes closed. Improved awareness and insight. STM improving. Sometimes needs cueing for concentration. .  Motor 4+ to 5/5 UE's. LE:   4+ to 5/5 prox to distal. fmc coordination is fair. He transferred without assist. Used RW and gait is fairly balance. He shuffles slightly. Changes directions without difficulties.  Skin: Skin is warmand dry.  Psych: pleasant and cooperative      Assessment & Plan:  1. Decreased  functional mobilitysecondary to acute small scattered right MCA and posterior watershed territory infarcts. -HH therapies to continue  -RW for sfaety 2. Pain Management: Flexeril and Ultram as needed. Monitor mental status 4. Mood: Zoloft 25 mg daily, Seroquel 12.5 mg daily at bedtime  -may be having some periodic anxiety attackts  -family is supportive 5. Neuropsych: This patient iscapable of making decisions on hisown behalf. 6. Skin/Wound Care: Routine skin checks 7. Fluids/Electrolytes/Nutrition: Routine I&O with follow-up chemistries reviewed 8.Atrial fibrillation. In regular rhythm at this time. 9.Escherichia coli UTI. Completed course of Rocephin.             -follow up testing negative 10.Hyperlipidemia. Lipitor 11.BPH. Pro scar 5 mg daily.               -stop flomax as it may be causing some of the dizziness/blurred vision             -I/O cath prn- keep residual volume 400 or less             -outpt urology follow up 12. Blurred vision/dizziness: complaints are very inconsistent. ?floaters.    -neuro exam unchanged to improved today  -dc flomax as above  -optho exam per routine is pending for this month  -observe for now. If he develops any associated weakness, cognitive deficits, loss of vision, daughter was advised to call office  Thirty minutes of face to face patient care time were spent during this visit. All questions were encouraged and answered. Follow up in 6 weeis.

## 2017-03-08 DIAGNOSIS — N400 Enlarged prostate without lower urinary tract symptoms: Secondary | ICD-10-CM | POA: Diagnosis not present

## 2017-03-08 DIAGNOSIS — I4891 Unspecified atrial fibrillation: Secondary | ICD-10-CM | POA: Diagnosis not present

## 2017-03-08 DIAGNOSIS — H5462 Unqualified visual loss, left eye, normal vision right eye: Secondary | ICD-10-CM | POA: Diagnosis not present

## 2017-03-08 DIAGNOSIS — N3000 Acute cystitis without hematuria: Secondary | ICD-10-CM | POA: Diagnosis not present

## 2017-03-08 DIAGNOSIS — B9620 Unspecified Escherichia coli [E. coli] as the cause of diseases classified elsewhere: Secondary | ICD-10-CM | POA: Diagnosis not present

## 2017-03-08 DIAGNOSIS — I69398 Other sequelae of cerebral infarction: Secondary | ICD-10-CM | POA: Diagnosis not present

## 2017-03-09 DIAGNOSIS — N400 Enlarged prostate without lower urinary tract symptoms: Secondary | ICD-10-CM | POA: Diagnosis not present

## 2017-03-09 DIAGNOSIS — B9620 Unspecified Escherichia coli [E. coli] as the cause of diseases classified elsewhere: Secondary | ICD-10-CM | POA: Diagnosis not present

## 2017-03-09 DIAGNOSIS — I69398 Other sequelae of cerebral infarction: Secondary | ICD-10-CM | POA: Diagnosis not present

## 2017-03-09 DIAGNOSIS — N3000 Acute cystitis without hematuria: Secondary | ICD-10-CM | POA: Diagnosis not present

## 2017-03-09 DIAGNOSIS — I4891 Unspecified atrial fibrillation: Secondary | ICD-10-CM | POA: Diagnosis not present

## 2017-03-09 DIAGNOSIS — H5462 Unqualified visual loss, left eye, normal vision right eye: Secondary | ICD-10-CM | POA: Diagnosis not present

## 2017-03-12 ENCOUNTER — Telehealth: Payer: Self-pay | Admitting: *Deleted

## 2017-03-12 DIAGNOSIS — I69398 Other sequelae of cerebral infarction: Secondary | ICD-10-CM | POA: Diagnosis not present

## 2017-03-12 DIAGNOSIS — N400 Enlarged prostate without lower urinary tract symptoms: Secondary | ICD-10-CM | POA: Diagnosis not present

## 2017-03-12 DIAGNOSIS — B9620 Unspecified Escherichia coli [E. coli] as the cause of diseases classified elsewhere: Secondary | ICD-10-CM | POA: Diagnosis not present

## 2017-03-12 DIAGNOSIS — I4891 Unspecified atrial fibrillation: Secondary | ICD-10-CM | POA: Diagnosis not present

## 2017-03-12 DIAGNOSIS — H5462 Unqualified visual loss, left eye, normal vision right eye: Secondary | ICD-10-CM | POA: Diagnosis not present

## 2017-03-12 DIAGNOSIS — N3000 Acute cystitis without hematuria: Secondary | ICD-10-CM | POA: Diagnosis not present

## 2017-03-12 NOTE — Telephone Encounter (Signed)
HHPT [name] called for vo for pla of car 1wk1. 2 wk6.  Approval given.Dr Riley KillSwartz saw Mr Lynwood DawleyMatherly 03/07/17 and note says HH to continue.

## 2017-03-13 DIAGNOSIS — B9620 Unspecified Escherichia coli [E. coli] as the cause of diseases classified elsewhere: Secondary | ICD-10-CM | POA: Diagnosis not present

## 2017-03-13 DIAGNOSIS — N3000 Acute cystitis without hematuria: Secondary | ICD-10-CM | POA: Diagnosis not present

## 2017-03-13 DIAGNOSIS — I69398 Other sequelae of cerebral infarction: Secondary | ICD-10-CM | POA: Diagnosis not present

## 2017-03-13 DIAGNOSIS — N400 Enlarged prostate without lower urinary tract symptoms: Secondary | ICD-10-CM | POA: Diagnosis not present

## 2017-03-13 DIAGNOSIS — H5462 Unqualified visual loss, left eye, normal vision right eye: Secondary | ICD-10-CM | POA: Diagnosis not present

## 2017-03-13 DIAGNOSIS — I4891 Unspecified atrial fibrillation: Secondary | ICD-10-CM | POA: Diagnosis not present

## 2017-03-14 ENCOUNTER — Telehealth: Payer: Self-pay

## 2017-03-14 DIAGNOSIS — I69398 Other sequelae of cerebral infarction: Secondary | ICD-10-CM | POA: Diagnosis not present

## 2017-03-14 DIAGNOSIS — B9620 Unspecified Escherichia coli [E. coli] as the cause of diseases classified elsewhere: Secondary | ICD-10-CM | POA: Diagnosis not present

## 2017-03-14 DIAGNOSIS — N3000 Acute cystitis without hematuria: Secondary | ICD-10-CM | POA: Diagnosis not present

## 2017-03-14 DIAGNOSIS — N400 Enlarged prostate without lower urinary tract symptoms: Secondary | ICD-10-CM | POA: Diagnosis not present

## 2017-03-14 DIAGNOSIS — H5462 Unqualified visual loss, left eye, normal vision right eye: Secondary | ICD-10-CM | POA: Diagnosis not present

## 2017-03-14 DIAGNOSIS — I4891 Unspecified atrial fibrillation: Secondary | ICD-10-CM | POA: Diagnosis not present

## 2017-03-14 NOTE — Telephone Encounter (Signed)
Junious DresserConnie OT [facility] called [phone], requesting verbal orders for 2xwk X 6wks, orders were approved.

## 2017-03-16 DIAGNOSIS — N400 Enlarged prostate without lower urinary tract symptoms: Secondary | ICD-10-CM | POA: Diagnosis not present

## 2017-03-16 DIAGNOSIS — B9620 Unspecified Escherichia coli [E. coli] as the cause of diseases classified elsewhere: Secondary | ICD-10-CM | POA: Diagnosis not present

## 2017-03-16 DIAGNOSIS — N3000 Acute cystitis without hematuria: Secondary | ICD-10-CM | POA: Diagnosis not present

## 2017-03-16 DIAGNOSIS — H5462 Unqualified visual loss, left eye, normal vision right eye: Secondary | ICD-10-CM | POA: Diagnosis not present

## 2017-03-16 DIAGNOSIS — I4891 Unspecified atrial fibrillation: Secondary | ICD-10-CM | POA: Diagnosis not present

## 2017-03-16 DIAGNOSIS — I69398 Other sequelae of cerebral infarction: Secondary | ICD-10-CM | POA: Diagnosis not present

## 2017-03-19 DIAGNOSIS — H5462 Unqualified visual loss, left eye, normal vision right eye: Secondary | ICD-10-CM | POA: Diagnosis not present

## 2017-03-19 DIAGNOSIS — I4891 Unspecified atrial fibrillation: Secondary | ICD-10-CM | POA: Diagnosis not present

## 2017-03-19 DIAGNOSIS — B9620 Unspecified Escherichia coli [E. coli] as the cause of diseases classified elsewhere: Secondary | ICD-10-CM | POA: Diagnosis not present

## 2017-03-19 DIAGNOSIS — N3000 Acute cystitis without hematuria: Secondary | ICD-10-CM | POA: Diagnosis not present

## 2017-03-19 DIAGNOSIS — N400 Enlarged prostate without lower urinary tract symptoms: Secondary | ICD-10-CM | POA: Diagnosis not present

## 2017-03-19 DIAGNOSIS — I69398 Other sequelae of cerebral infarction: Secondary | ICD-10-CM | POA: Diagnosis not present

## 2017-03-20 DIAGNOSIS — I4891 Unspecified atrial fibrillation: Secondary | ICD-10-CM | POA: Diagnosis not present

## 2017-03-20 DIAGNOSIS — B9620 Unspecified Escherichia coli [E. coli] as the cause of diseases classified elsewhere: Secondary | ICD-10-CM | POA: Diagnosis not present

## 2017-03-20 DIAGNOSIS — H5462 Unqualified visual loss, left eye, normal vision right eye: Secondary | ICD-10-CM | POA: Diagnosis not present

## 2017-03-20 DIAGNOSIS — N3000 Acute cystitis without hematuria: Secondary | ICD-10-CM | POA: Diagnosis not present

## 2017-03-20 DIAGNOSIS — I69398 Other sequelae of cerebral infarction: Secondary | ICD-10-CM | POA: Diagnosis not present

## 2017-03-20 DIAGNOSIS — N400 Enlarged prostate without lower urinary tract symptoms: Secondary | ICD-10-CM | POA: Diagnosis not present

## 2017-03-21 DIAGNOSIS — I69398 Other sequelae of cerebral infarction: Secondary | ICD-10-CM | POA: Diagnosis not present

## 2017-03-21 DIAGNOSIS — N3000 Acute cystitis without hematuria: Secondary | ICD-10-CM | POA: Diagnosis not present

## 2017-03-21 DIAGNOSIS — N400 Enlarged prostate without lower urinary tract symptoms: Secondary | ICD-10-CM | POA: Diagnosis not present

## 2017-03-21 DIAGNOSIS — I4891 Unspecified atrial fibrillation: Secondary | ICD-10-CM | POA: Diagnosis not present

## 2017-03-21 DIAGNOSIS — B9620 Unspecified Escherichia coli [E. coli] as the cause of diseases classified elsewhere: Secondary | ICD-10-CM | POA: Diagnosis not present

## 2017-03-21 DIAGNOSIS — H5462 Unqualified visual loss, left eye, normal vision right eye: Secondary | ICD-10-CM | POA: Diagnosis not present

## 2017-03-22 DIAGNOSIS — I4891 Unspecified atrial fibrillation: Secondary | ICD-10-CM | POA: Diagnosis not present

## 2017-03-22 DIAGNOSIS — I69398 Other sequelae of cerebral infarction: Secondary | ICD-10-CM | POA: Diagnosis not present

## 2017-03-22 DIAGNOSIS — N400 Enlarged prostate without lower urinary tract symptoms: Secondary | ICD-10-CM | POA: Diagnosis not present

## 2017-03-22 DIAGNOSIS — H5462 Unqualified visual loss, left eye, normal vision right eye: Secondary | ICD-10-CM | POA: Diagnosis not present

## 2017-03-22 DIAGNOSIS — B9620 Unspecified Escherichia coli [E. coli] as the cause of diseases classified elsewhere: Secondary | ICD-10-CM | POA: Diagnosis not present

## 2017-03-22 DIAGNOSIS — N3000 Acute cystitis without hematuria: Secondary | ICD-10-CM | POA: Diagnosis not present

## 2017-03-23 DIAGNOSIS — N400 Enlarged prostate without lower urinary tract symptoms: Secondary | ICD-10-CM | POA: Diagnosis not present

## 2017-03-23 DIAGNOSIS — I4891 Unspecified atrial fibrillation: Secondary | ICD-10-CM | POA: Diagnosis not present

## 2017-03-23 DIAGNOSIS — B9620 Unspecified Escherichia coli [E. coli] as the cause of diseases classified elsewhere: Secondary | ICD-10-CM | POA: Diagnosis not present

## 2017-03-23 DIAGNOSIS — H5462 Unqualified visual loss, left eye, normal vision right eye: Secondary | ICD-10-CM | POA: Diagnosis not present

## 2017-03-23 DIAGNOSIS — I69398 Other sequelae of cerebral infarction: Secondary | ICD-10-CM | POA: Diagnosis not present

## 2017-03-23 DIAGNOSIS — N3000 Acute cystitis without hematuria: Secondary | ICD-10-CM | POA: Diagnosis not present

## 2017-03-26 ENCOUNTER — Telehealth: Payer: Self-pay | Admitting: Physical Medicine & Rehabilitation

## 2017-03-26 DIAGNOSIS — B9620 Unspecified Escherichia coli [E. coli] as the cause of diseases classified elsewhere: Secondary | ICD-10-CM | POA: Diagnosis not present

## 2017-03-26 DIAGNOSIS — I4891 Unspecified atrial fibrillation: Secondary | ICD-10-CM | POA: Diagnosis not present

## 2017-03-26 DIAGNOSIS — N400 Enlarged prostate without lower urinary tract symptoms: Secondary | ICD-10-CM | POA: Diagnosis not present

## 2017-03-26 DIAGNOSIS — I69398 Other sequelae of cerebral infarction: Secondary | ICD-10-CM | POA: Diagnosis not present

## 2017-03-26 DIAGNOSIS — N3000 Acute cystitis without hematuria: Secondary | ICD-10-CM | POA: Diagnosis not present

## 2017-03-26 DIAGNOSIS — H5462 Unqualified visual loss, left eye, normal vision right eye: Secondary | ICD-10-CM | POA: Diagnosis not present

## 2017-03-26 NOTE — Telephone Encounter (Signed)
Patient called and advised he is at [facility] with daughter Rich Fuchs [phone] - okay to discuss visit - she advised [facility] for last visit as to why [facility]S changed meds (Tomulosin) - asked for ROI from ptn to be faxed to me and I will fax back to patient at [facility].

## 2017-03-27 DIAGNOSIS — B9620 Unspecified Escherichia coli [E. coli] as the cause of diseases classified elsewhere: Secondary | ICD-10-CM | POA: Diagnosis not present

## 2017-03-27 DIAGNOSIS — H5462 Unqualified visual loss, left eye, normal vision right eye: Secondary | ICD-10-CM | POA: Diagnosis not present

## 2017-03-27 DIAGNOSIS — N3000 Acute cystitis without hematuria: Secondary | ICD-10-CM | POA: Diagnosis not present

## 2017-03-27 DIAGNOSIS — I69398 Other sequelae of cerebral infarction: Secondary | ICD-10-CM | POA: Diagnosis not present

## 2017-03-27 DIAGNOSIS — I4891 Unspecified atrial fibrillation: Secondary | ICD-10-CM | POA: Diagnosis not present

## 2017-03-27 DIAGNOSIS — N400 Enlarged prostate without lower urinary tract symptoms: Secondary | ICD-10-CM | POA: Diagnosis not present

## 2017-03-28 ENCOUNTER — Ambulatory Visit: Payer: Medicare Other | Admitting: Physical Medicine & Rehabilitation

## 2017-03-28 DIAGNOSIS — N3000 Acute cystitis without hematuria: Secondary | ICD-10-CM | POA: Diagnosis not present

## 2017-03-28 DIAGNOSIS — N400 Enlarged prostate without lower urinary tract symptoms: Secondary | ICD-10-CM | POA: Diagnosis not present

## 2017-03-28 DIAGNOSIS — I4891 Unspecified atrial fibrillation: Secondary | ICD-10-CM | POA: Diagnosis not present

## 2017-03-28 DIAGNOSIS — Z7982 Long term (current) use of aspirin: Secondary | ICD-10-CM | POA: Diagnosis not present

## 2017-03-28 DIAGNOSIS — Z466 Encounter for fitting and adjustment of urinary device: Secondary | ICD-10-CM | POA: Diagnosis not present

## 2017-03-28 DIAGNOSIS — Z96642 Presence of left artificial hip joint: Secondary | ICD-10-CM | POA: Diagnosis not present

## 2017-03-28 DIAGNOSIS — I69398 Other sequelae of cerebral infarction: Secondary | ICD-10-CM | POA: Diagnosis not present

## 2017-03-28 DIAGNOSIS — H5462 Unqualified visual loss, left eye, normal vision right eye: Secondary | ICD-10-CM | POA: Diagnosis not present

## 2017-03-28 DIAGNOSIS — B9620 Unspecified Escherichia coli [E. coli] as the cause of diseases classified elsewhere: Secondary | ICD-10-CM | POA: Diagnosis not present

## 2017-03-28 DIAGNOSIS — F329 Major depressive disorder, single episode, unspecified: Secondary | ICD-10-CM | POA: Diagnosis not present

## 2017-03-28 DIAGNOSIS — Z9181 History of falling: Secondary | ICD-10-CM | POA: Diagnosis not present

## 2017-03-28 DIAGNOSIS — Z7901 Long term (current) use of anticoagulants: Secondary | ICD-10-CM | POA: Diagnosis not present

## 2017-03-29 DIAGNOSIS — N400 Enlarged prostate without lower urinary tract symptoms: Secondary | ICD-10-CM | POA: Diagnosis not present

## 2017-03-29 DIAGNOSIS — H5462 Unqualified visual loss, left eye, normal vision right eye: Secondary | ICD-10-CM | POA: Diagnosis not present

## 2017-03-29 DIAGNOSIS — B9620 Unspecified Escherichia coli [E. coli] as the cause of diseases classified elsewhere: Secondary | ICD-10-CM | POA: Diagnosis not present

## 2017-03-29 DIAGNOSIS — I69398 Other sequelae of cerebral infarction: Secondary | ICD-10-CM | POA: Diagnosis not present

## 2017-03-29 DIAGNOSIS — N3000 Acute cystitis without hematuria: Secondary | ICD-10-CM | POA: Diagnosis not present

## 2017-03-29 DIAGNOSIS — I4891 Unspecified atrial fibrillation: Secondary | ICD-10-CM | POA: Diagnosis not present

## 2017-03-30 DIAGNOSIS — I4891 Unspecified atrial fibrillation: Secondary | ICD-10-CM | POA: Diagnosis not present

## 2017-03-30 DIAGNOSIS — B9620 Unspecified Escherichia coli [E. coli] as the cause of diseases classified elsewhere: Secondary | ICD-10-CM | POA: Diagnosis not present

## 2017-03-30 DIAGNOSIS — N400 Enlarged prostate without lower urinary tract symptoms: Secondary | ICD-10-CM | POA: Diagnosis not present

## 2017-03-30 DIAGNOSIS — H5462 Unqualified visual loss, left eye, normal vision right eye: Secondary | ICD-10-CM | POA: Diagnosis not present

## 2017-03-30 DIAGNOSIS — N3000 Acute cystitis without hematuria: Secondary | ICD-10-CM | POA: Diagnosis not present

## 2017-03-30 DIAGNOSIS — I69398 Other sequelae of cerebral infarction: Secondary | ICD-10-CM | POA: Diagnosis not present

## 2017-04-02 DIAGNOSIS — H5462 Unqualified visual loss, left eye, normal vision right eye: Secondary | ICD-10-CM | POA: Diagnosis not present

## 2017-04-02 DIAGNOSIS — N400 Enlarged prostate without lower urinary tract symptoms: Secondary | ICD-10-CM | POA: Diagnosis not present

## 2017-04-02 DIAGNOSIS — N3000 Acute cystitis without hematuria: Secondary | ICD-10-CM | POA: Diagnosis not present

## 2017-04-02 DIAGNOSIS — B9620 Unspecified Escherichia coli [E. coli] as the cause of diseases classified elsewhere: Secondary | ICD-10-CM | POA: Diagnosis not present

## 2017-04-02 DIAGNOSIS — I69398 Other sequelae of cerebral infarction: Secondary | ICD-10-CM | POA: Diagnosis not present

## 2017-04-02 DIAGNOSIS — I4891 Unspecified atrial fibrillation: Secondary | ICD-10-CM | POA: Diagnosis not present

## 2017-04-03 DIAGNOSIS — B9620 Unspecified Escherichia coli [E. coli] as the cause of diseases classified elsewhere: Secondary | ICD-10-CM | POA: Diagnosis not present

## 2017-04-03 DIAGNOSIS — N400 Enlarged prostate without lower urinary tract symptoms: Secondary | ICD-10-CM | POA: Diagnosis not present

## 2017-04-03 DIAGNOSIS — I69398 Other sequelae of cerebral infarction: Secondary | ICD-10-CM | POA: Diagnosis not present

## 2017-04-03 DIAGNOSIS — H5462 Unqualified visual loss, left eye, normal vision right eye: Secondary | ICD-10-CM | POA: Diagnosis not present

## 2017-04-03 DIAGNOSIS — N3000 Acute cystitis without hematuria: Secondary | ICD-10-CM | POA: Diagnosis not present

## 2017-04-03 DIAGNOSIS — I4891 Unspecified atrial fibrillation: Secondary | ICD-10-CM | POA: Diagnosis not present

## 2017-04-04 DIAGNOSIS — I4891 Unspecified atrial fibrillation: Secondary | ICD-10-CM | POA: Diagnosis not present

## 2017-04-04 DIAGNOSIS — N3000 Acute cystitis without hematuria: Secondary | ICD-10-CM | POA: Diagnosis not present

## 2017-04-04 DIAGNOSIS — I69398 Other sequelae of cerebral infarction: Secondary | ICD-10-CM | POA: Diagnosis not present

## 2017-04-04 DIAGNOSIS — B9620 Unspecified Escherichia coli [E. coli] as the cause of diseases classified elsewhere: Secondary | ICD-10-CM | POA: Diagnosis not present

## 2017-04-04 DIAGNOSIS — N400 Enlarged prostate without lower urinary tract symptoms: Secondary | ICD-10-CM | POA: Diagnosis not present

## 2017-04-04 DIAGNOSIS — H5462 Unqualified visual loss, left eye, normal vision right eye: Secondary | ICD-10-CM | POA: Diagnosis not present

## 2017-04-06 DIAGNOSIS — N3000 Acute cystitis without hematuria: Secondary | ICD-10-CM | POA: Diagnosis not present

## 2017-04-06 DIAGNOSIS — I4891 Unspecified atrial fibrillation: Secondary | ICD-10-CM | POA: Diagnosis not present

## 2017-04-06 DIAGNOSIS — N400 Enlarged prostate without lower urinary tract symptoms: Secondary | ICD-10-CM | POA: Diagnosis not present

## 2017-04-06 DIAGNOSIS — B9620 Unspecified Escherichia coli [E. coli] as the cause of diseases classified elsewhere: Secondary | ICD-10-CM | POA: Diagnosis not present

## 2017-04-06 DIAGNOSIS — I69398 Other sequelae of cerebral infarction: Secondary | ICD-10-CM | POA: Diagnosis not present

## 2017-04-06 DIAGNOSIS — H5462 Unqualified visual loss, left eye, normal vision right eye: Secondary | ICD-10-CM | POA: Diagnosis not present

## 2017-04-10 DIAGNOSIS — N3000 Acute cystitis without hematuria: Secondary | ICD-10-CM | POA: Diagnosis not present

## 2017-04-10 DIAGNOSIS — I69398 Other sequelae of cerebral infarction: Secondary | ICD-10-CM | POA: Diagnosis not present

## 2017-04-10 DIAGNOSIS — H5462 Unqualified visual loss, left eye, normal vision right eye: Secondary | ICD-10-CM | POA: Diagnosis not present

## 2017-04-10 DIAGNOSIS — N400 Enlarged prostate without lower urinary tract symptoms: Secondary | ICD-10-CM | POA: Diagnosis not present

## 2017-04-10 DIAGNOSIS — I4891 Unspecified atrial fibrillation: Secondary | ICD-10-CM | POA: Diagnosis not present

## 2017-04-10 DIAGNOSIS — B9620 Unspecified Escherichia coli [E. coli] as the cause of diseases classified elsewhere: Secondary | ICD-10-CM | POA: Diagnosis not present

## 2017-04-11 DIAGNOSIS — N400 Enlarged prostate without lower urinary tract symptoms: Secondary | ICD-10-CM | POA: Diagnosis not present

## 2017-04-11 DIAGNOSIS — I4891 Unspecified atrial fibrillation: Secondary | ICD-10-CM | POA: Diagnosis not present

## 2017-04-11 DIAGNOSIS — N3000 Acute cystitis without hematuria: Secondary | ICD-10-CM | POA: Diagnosis not present

## 2017-04-11 DIAGNOSIS — H5462 Unqualified visual loss, left eye, normal vision right eye: Secondary | ICD-10-CM | POA: Diagnosis not present

## 2017-04-11 DIAGNOSIS — B9620 Unspecified Escherichia coli [E. coli] as the cause of diseases classified elsewhere: Secondary | ICD-10-CM | POA: Diagnosis not present

## 2017-04-11 DIAGNOSIS — I69398 Other sequelae of cerebral infarction: Secondary | ICD-10-CM | POA: Diagnosis not present

## 2017-04-16 DIAGNOSIS — N400 Enlarged prostate without lower urinary tract symptoms: Secondary | ICD-10-CM | POA: Diagnosis not present

## 2017-04-16 DIAGNOSIS — I69398 Other sequelae of cerebral infarction: Secondary | ICD-10-CM | POA: Diagnosis not present

## 2017-04-16 DIAGNOSIS — H5462 Unqualified visual loss, left eye, normal vision right eye: Secondary | ICD-10-CM | POA: Diagnosis not present

## 2017-04-16 DIAGNOSIS — B9620 Unspecified Escherichia coli [E. coli] as the cause of diseases classified elsewhere: Secondary | ICD-10-CM | POA: Diagnosis not present

## 2017-04-16 DIAGNOSIS — I4891 Unspecified atrial fibrillation: Secondary | ICD-10-CM | POA: Diagnosis not present

## 2017-04-16 DIAGNOSIS — N3000 Acute cystitis without hematuria: Secondary | ICD-10-CM | POA: Diagnosis not present

## 2017-04-17 DIAGNOSIS — N400 Enlarged prostate without lower urinary tract symptoms: Secondary | ICD-10-CM | POA: Diagnosis not present

## 2017-04-17 DIAGNOSIS — I4891 Unspecified atrial fibrillation: Secondary | ICD-10-CM | POA: Diagnosis not present

## 2017-04-17 DIAGNOSIS — B9620 Unspecified Escherichia coli [E. coli] as the cause of diseases classified elsewhere: Secondary | ICD-10-CM | POA: Diagnosis not present

## 2017-04-17 DIAGNOSIS — I69398 Other sequelae of cerebral infarction: Secondary | ICD-10-CM | POA: Diagnosis not present

## 2017-04-17 DIAGNOSIS — H5462 Unqualified visual loss, left eye, normal vision right eye: Secondary | ICD-10-CM | POA: Diagnosis not present

## 2017-04-17 DIAGNOSIS — N3000 Acute cystitis without hematuria: Secondary | ICD-10-CM | POA: Diagnosis not present

## 2017-04-18 ENCOUNTER — Encounter: Payer: Medicare Other | Admitting: Physical Medicine & Rehabilitation

## 2017-04-18 DIAGNOSIS — H5462 Unqualified visual loss, left eye, normal vision right eye: Secondary | ICD-10-CM | POA: Diagnosis not present

## 2017-04-18 DIAGNOSIS — N400 Enlarged prostate without lower urinary tract symptoms: Secondary | ICD-10-CM | POA: Diagnosis not present

## 2017-04-18 DIAGNOSIS — I4891 Unspecified atrial fibrillation: Secondary | ICD-10-CM | POA: Diagnosis not present

## 2017-04-18 DIAGNOSIS — I69398 Other sequelae of cerebral infarction: Secondary | ICD-10-CM | POA: Diagnosis not present

## 2017-04-18 DIAGNOSIS — B9620 Unspecified Escherichia coli [E. coli] as the cause of diseases classified elsewhere: Secondary | ICD-10-CM | POA: Diagnosis not present

## 2017-04-18 DIAGNOSIS — N3000 Acute cystitis without hematuria: Secondary | ICD-10-CM | POA: Diagnosis not present

## 2017-04-19 DIAGNOSIS — N3000 Acute cystitis without hematuria: Secondary | ICD-10-CM | POA: Diagnosis not present

## 2017-04-19 DIAGNOSIS — I4891 Unspecified atrial fibrillation: Secondary | ICD-10-CM | POA: Diagnosis not present

## 2017-04-19 DIAGNOSIS — H5462 Unqualified visual loss, left eye, normal vision right eye: Secondary | ICD-10-CM | POA: Diagnosis not present

## 2017-04-19 DIAGNOSIS — B9620 Unspecified Escherichia coli [E. coli] as the cause of diseases classified elsewhere: Secondary | ICD-10-CM | POA: Diagnosis not present

## 2017-04-19 DIAGNOSIS — N400 Enlarged prostate without lower urinary tract symptoms: Secondary | ICD-10-CM | POA: Diagnosis not present

## 2017-04-19 DIAGNOSIS — I69398 Other sequelae of cerebral infarction: Secondary | ICD-10-CM | POA: Diagnosis not present

## 2017-04-23 ENCOUNTER — Telehealth: Payer: Self-pay | Admitting: *Deleted

## 2017-04-23 DIAGNOSIS — H5462 Unqualified visual loss, left eye, normal vision right eye: Secondary | ICD-10-CM | POA: Diagnosis not present

## 2017-04-23 DIAGNOSIS — I4891 Unspecified atrial fibrillation: Secondary | ICD-10-CM | POA: Diagnosis not present

## 2017-04-23 DIAGNOSIS — B9620 Unspecified Escherichia coli [E. coli] as the cause of diseases classified elsewhere: Secondary | ICD-10-CM | POA: Diagnosis not present

## 2017-04-23 DIAGNOSIS — N400 Enlarged prostate without lower urinary tract symptoms: Secondary | ICD-10-CM | POA: Diagnosis not present

## 2017-04-23 DIAGNOSIS — N3000 Acute cystitis without hematuria: Secondary | ICD-10-CM | POA: Diagnosis not present

## 2017-04-23 DIAGNOSIS — I69398 Other sequelae of cerebral infarction: Secondary | ICD-10-CM | POA: Diagnosis not present

## 2017-04-23 NOTE — Telephone Encounter (Signed)
Connie OT called for vo to continue 2wk2.  Approval given.

## 2017-04-24 DIAGNOSIS — N400 Enlarged prostate without lower urinary tract symptoms: Secondary | ICD-10-CM | POA: Diagnosis not present

## 2017-04-24 DIAGNOSIS — I4891 Unspecified atrial fibrillation: Secondary | ICD-10-CM | POA: Diagnosis not present

## 2017-04-24 DIAGNOSIS — N3000 Acute cystitis without hematuria: Secondary | ICD-10-CM | POA: Diagnosis not present

## 2017-04-24 DIAGNOSIS — H5462 Unqualified visual loss, left eye, normal vision right eye: Secondary | ICD-10-CM | POA: Diagnosis not present

## 2017-04-24 DIAGNOSIS — I69398 Other sequelae of cerebral infarction: Secondary | ICD-10-CM | POA: Diagnosis not present

## 2017-04-24 DIAGNOSIS — B9620 Unspecified Escherichia coli [E. coli] as the cause of diseases classified elsewhere: Secondary | ICD-10-CM | POA: Diagnosis not present

## 2017-04-25 DIAGNOSIS — N400 Enlarged prostate without lower urinary tract symptoms: Secondary | ICD-10-CM | POA: Diagnosis not present

## 2017-04-25 DIAGNOSIS — I4891 Unspecified atrial fibrillation: Secondary | ICD-10-CM | POA: Diagnosis not present

## 2017-04-25 DIAGNOSIS — H5462 Unqualified visual loss, left eye, normal vision right eye: Secondary | ICD-10-CM | POA: Diagnosis not present

## 2017-04-25 DIAGNOSIS — B9620 Unspecified Escherichia coli [E. coli] as the cause of diseases classified elsewhere: Secondary | ICD-10-CM | POA: Diagnosis not present

## 2017-04-25 DIAGNOSIS — I69398 Other sequelae of cerebral infarction: Secondary | ICD-10-CM | POA: Diagnosis not present

## 2017-04-25 DIAGNOSIS — N3000 Acute cystitis without hematuria: Secondary | ICD-10-CM | POA: Diagnosis not present

## 2017-04-27 DIAGNOSIS — I4891 Unspecified atrial fibrillation: Secondary | ICD-10-CM | POA: Diagnosis not present

## 2017-04-27 DIAGNOSIS — N400 Enlarged prostate without lower urinary tract symptoms: Secondary | ICD-10-CM | POA: Diagnosis not present

## 2017-04-27 DIAGNOSIS — N3000 Acute cystitis without hematuria: Secondary | ICD-10-CM | POA: Diagnosis not present

## 2017-04-27 DIAGNOSIS — B9620 Unspecified Escherichia coli [E. coli] as the cause of diseases classified elsewhere: Secondary | ICD-10-CM | POA: Diagnosis not present

## 2017-04-27 DIAGNOSIS — H5462 Unqualified visual loss, left eye, normal vision right eye: Secondary | ICD-10-CM | POA: Diagnosis not present

## 2017-04-27 DIAGNOSIS — I69398 Other sequelae of cerebral infarction: Secondary | ICD-10-CM | POA: Diagnosis not present

## 2017-04-30 ENCOUNTER — Encounter: Payer: Self-pay | Admitting: Hematology and Oncology

## 2017-04-30 DIAGNOSIS — I69398 Other sequelae of cerebral infarction: Secondary | ICD-10-CM | POA: Diagnosis not present

## 2017-04-30 DIAGNOSIS — B9620 Unspecified Escherichia coli [E. coli] as the cause of diseases classified elsewhere: Secondary | ICD-10-CM | POA: Diagnosis not present

## 2017-04-30 DIAGNOSIS — I4891 Unspecified atrial fibrillation: Secondary | ICD-10-CM | POA: Diagnosis not present

## 2017-04-30 DIAGNOSIS — N3000 Acute cystitis without hematuria: Secondary | ICD-10-CM | POA: Diagnosis not present

## 2017-04-30 DIAGNOSIS — N400 Enlarged prostate without lower urinary tract symptoms: Secondary | ICD-10-CM | POA: Diagnosis not present

## 2017-04-30 DIAGNOSIS — H5462 Unqualified visual loss, left eye, normal vision right eye: Secondary | ICD-10-CM | POA: Diagnosis not present

## 2017-05-01 DIAGNOSIS — I69398 Other sequelae of cerebral infarction: Secondary | ICD-10-CM | POA: Diagnosis not present

## 2017-05-01 DIAGNOSIS — H5462 Unqualified visual loss, left eye, normal vision right eye: Secondary | ICD-10-CM | POA: Diagnosis not present

## 2017-05-01 DIAGNOSIS — N400 Enlarged prostate without lower urinary tract symptoms: Secondary | ICD-10-CM | POA: Diagnosis not present

## 2017-05-01 DIAGNOSIS — B9620 Unspecified Escherichia coli [E. coli] as the cause of diseases classified elsewhere: Secondary | ICD-10-CM | POA: Diagnosis not present

## 2017-05-01 DIAGNOSIS — I4891 Unspecified atrial fibrillation: Secondary | ICD-10-CM | POA: Diagnosis not present

## 2017-05-01 DIAGNOSIS — N3000 Acute cystitis without hematuria: Secondary | ICD-10-CM | POA: Diagnosis not present

## 2017-05-02 DIAGNOSIS — H5462 Unqualified visual loss, left eye, normal vision right eye: Secondary | ICD-10-CM | POA: Diagnosis not present

## 2017-05-02 DIAGNOSIS — I4891 Unspecified atrial fibrillation: Secondary | ICD-10-CM | POA: Diagnosis not present

## 2017-05-02 DIAGNOSIS — N3000 Acute cystitis without hematuria: Secondary | ICD-10-CM | POA: Diagnosis not present

## 2017-05-02 DIAGNOSIS — N400 Enlarged prostate without lower urinary tract symptoms: Secondary | ICD-10-CM | POA: Diagnosis not present

## 2017-05-02 DIAGNOSIS — I69398 Other sequelae of cerebral infarction: Secondary | ICD-10-CM | POA: Diagnosis not present

## 2017-05-02 DIAGNOSIS — B9620 Unspecified Escherichia coli [E. coli] as the cause of diseases classified elsewhere: Secondary | ICD-10-CM | POA: Diagnosis not present

## 2017-05-03 DIAGNOSIS — B9620 Unspecified Escherichia coli [E. coli] as the cause of diseases classified elsewhere: Secondary | ICD-10-CM | POA: Diagnosis not present

## 2017-05-03 DIAGNOSIS — N3000 Acute cystitis without hematuria: Secondary | ICD-10-CM | POA: Diagnosis not present

## 2017-05-03 DIAGNOSIS — I69398 Other sequelae of cerebral infarction: Secondary | ICD-10-CM | POA: Diagnosis not present

## 2017-05-03 DIAGNOSIS — H5462 Unqualified visual loss, left eye, normal vision right eye: Secondary | ICD-10-CM | POA: Diagnosis not present

## 2017-05-03 DIAGNOSIS — I4891 Unspecified atrial fibrillation: Secondary | ICD-10-CM | POA: Diagnosis not present

## 2017-05-03 DIAGNOSIS — N400 Enlarged prostate without lower urinary tract symptoms: Secondary | ICD-10-CM | POA: Diagnosis not present

## 2017-05-16 ENCOUNTER — Encounter: Payer: Medicare Other | Admitting: Physical Medicine & Rehabilitation

## 2017-08-16 ENCOUNTER — Encounter (HOSPITAL_COMMUNITY): Payer: Self-pay | Admitting: Nurse Practitioner

## 2017-08-16 ENCOUNTER — Emergency Department (HOSPITAL_COMMUNITY): Payer: Medicare Other

## 2017-08-16 ENCOUNTER — Inpatient Hospital Stay (HOSPITAL_COMMUNITY)
Admission: EM | Admit: 2017-08-16 | Discharge: 2017-08-25 | DRG: 064 | Disposition: A | Discharge status: Hospice | Payer: Medicare Other | Attending: Neurology | Admitting: Neurology

## 2017-08-16 DIAGNOSIS — E878 Other disorders of electrolyte and fluid balance, not elsewhere classified: Secondary | ICD-10-CM | POA: Diagnosis present

## 2017-08-16 DIAGNOSIS — S065X0A Traumatic subdural hemorrhage without loss of consciousness, initial encounter: Secondary | ICD-10-CM | POA: Diagnosis not present

## 2017-08-16 DIAGNOSIS — R0683 Snoring: Secondary | ICD-10-CM | POA: Diagnosis present

## 2017-08-16 DIAGNOSIS — Z87440 Personal history of urinary (tract) infections: Secondary | ICD-10-CM

## 2017-08-16 DIAGNOSIS — I4891 Unspecified atrial fibrillation: Secondary | ICD-10-CM | POA: Diagnosis not present

## 2017-08-16 DIAGNOSIS — Z79899 Other long term (current) drug therapy: Secondary | ICD-10-CM

## 2017-08-16 DIAGNOSIS — F05 Delirium due to known physiological condition: Secondary | ICD-10-CM | POA: Diagnosis present

## 2017-08-16 DIAGNOSIS — Z7189 Other specified counseling: Secondary | ICD-10-CM

## 2017-08-16 DIAGNOSIS — B961 Klebsiella pneumoniae [K. pneumoniae] as the cause of diseases classified elsewhere: Secondary | ICD-10-CM | POA: Diagnosis present

## 2017-08-16 DIAGNOSIS — Z8249 Family history of ischemic heart disease and other diseases of the circulatory system: Secondary | ICD-10-CM

## 2017-08-16 DIAGNOSIS — Z515 Encounter for palliative care: Secondary | ICD-10-CM | POA: Diagnosis not present

## 2017-08-16 DIAGNOSIS — G8194 Hemiplegia, unspecified affecting left nondominant side: Secondary | ICD-10-CM | POA: Diagnosis present

## 2017-08-16 DIAGNOSIS — F0390 Unspecified dementia without behavioral disturbance: Secondary | ICD-10-CM | POA: Diagnosis present

## 2017-08-16 DIAGNOSIS — G4733 Obstructive sleep apnea (adult) (pediatric): Secondary | ICD-10-CM | POA: Diagnosis present

## 2017-08-16 DIAGNOSIS — I615 Nontraumatic intracerebral hemorrhage, intraventricular: Secondary | ICD-10-CM | POA: Diagnosis not present

## 2017-08-16 DIAGNOSIS — R443 Hallucinations, unspecified: Secondary | ICD-10-CM | POA: Diagnosis present

## 2017-08-16 DIAGNOSIS — D689 Coagulation defect, unspecified: Secondary | ICD-10-CM | POA: Diagnosis present

## 2017-08-16 DIAGNOSIS — Z4682 Encounter for fitting and adjustment of non-vascular catheter: Secondary | ICD-10-CM | POA: Diagnosis not present

## 2017-08-16 DIAGNOSIS — Z96642 Presence of left artificial hip joint: Secondary | ICD-10-CM | POA: Diagnosis present

## 2017-08-16 DIAGNOSIS — A419 Sepsis, unspecified organism: Secondary | ICD-10-CM | POA: Diagnosis not present

## 2017-08-16 DIAGNOSIS — G919 Hydrocephalus, unspecified: Secondary | ICD-10-CM | POA: Diagnosis not present

## 2017-08-16 DIAGNOSIS — I161 Hypertensive emergency: Secondary | ICD-10-CM | POA: Diagnosis present

## 2017-08-16 DIAGNOSIS — J189 Pneumonia, unspecified organism: Secondary | ICD-10-CM | POA: Diagnosis not present

## 2017-08-16 DIAGNOSIS — R29818 Other symptoms and signs involving the nervous system: Secondary | ICD-10-CM | POA: Diagnosis not present

## 2017-08-16 DIAGNOSIS — I472 Ventricular tachycardia: Secondary | ICD-10-CM | POA: Diagnosis present

## 2017-08-16 DIAGNOSIS — I10 Essential (primary) hypertension: Secondary | ICD-10-CM | POA: Diagnosis not present

## 2017-08-16 DIAGNOSIS — Z8673 Personal history of transient ischemic attack (TIA), and cerebral infarction without residual deficits: Secondary | ICD-10-CM

## 2017-08-16 DIAGNOSIS — I480 Paroxysmal atrial fibrillation: Secondary | ICD-10-CM | POA: Diagnosis present

## 2017-08-16 DIAGNOSIS — R471 Dysarthria and anarthria: Secondary | ICD-10-CM | POA: Diagnosis not present

## 2017-08-16 DIAGNOSIS — G936 Cerebral edema: Secondary | ICD-10-CM | POA: Diagnosis present

## 2017-08-16 DIAGNOSIS — R414 Neurologic neglect syndrome: Secondary | ICD-10-CM | POA: Diagnosis present

## 2017-08-16 DIAGNOSIS — N390 Urinary tract infection, site not specified: Secondary | ICD-10-CM | POA: Diagnosis present

## 2017-08-16 DIAGNOSIS — Z7901 Long term (current) use of anticoagulants: Secondary | ICD-10-CM | POA: Diagnosis not present

## 2017-08-16 DIAGNOSIS — I674 Hypertensive encephalopathy: Secondary | ICD-10-CM | POA: Diagnosis present

## 2017-08-16 DIAGNOSIS — Z781 Physical restraint status: Secondary | ICD-10-CM

## 2017-08-16 DIAGNOSIS — E876 Hypokalemia: Secondary | ICD-10-CM | POA: Diagnosis not present

## 2017-08-16 DIAGNOSIS — R339 Retention of urine, unspecified: Secondary | ICD-10-CM | POA: Diagnosis not present

## 2017-08-16 DIAGNOSIS — R4182 Altered mental status, unspecified: Secondary | ICD-10-CM | POA: Diagnosis not present

## 2017-08-16 DIAGNOSIS — N179 Acute kidney failure, unspecified: Secondary | ICD-10-CM | POA: Diagnosis present

## 2017-08-16 DIAGNOSIS — Z789 Other specified health status: Secondary | ICD-10-CM | POA: Diagnosis not present

## 2017-08-16 DIAGNOSIS — E871 Hypo-osmolality and hyponatremia: Secondary | ICD-10-CM | POA: Diagnosis present

## 2017-08-16 DIAGNOSIS — I444 Left anterior fascicular block: Secondary | ICD-10-CM | POA: Diagnosis present

## 2017-08-16 DIAGNOSIS — J690 Pneumonitis due to inhalation of food and vomit: Secondary | ICD-10-CM | POA: Diagnosis present

## 2017-08-16 DIAGNOSIS — R1310 Dysphagia, unspecified: Secondary | ICD-10-CM | POA: Diagnosis not present

## 2017-08-16 DIAGNOSIS — R8281 Pyuria: Secondary | ICD-10-CM

## 2017-08-16 DIAGNOSIS — S065X9A Traumatic subdural hemorrhage with loss of consciousness of unspecified duration, initial encounter: Secondary | ICD-10-CM | POA: Diagnosis present

## 2017-08-16 DIAGNOSIS — I611 Nontraumatic intracerebral hemorrhage in hemisphere, cortical: Secondary | ICD-10-CM | POA: Diagnosis not present

## 2017-08-16 DIAGNOSIS — I69318 Other symptoms and signs involving cognitive functions following cerebral infarction: Secondary | ICD-10-CM | POA: Diagnosis not present

## 2017-08-16 DIAGNOSIS — R509 Fever, unspecified: Secondary | ICD-10-CM | POA: Diagnosis not present

## 2017-08-16 DIAGNOSIS — R451 Restlessness and agitation: Secondary | ICD-10-CM | POA: Diagnosis present

## 2017-08-16 DIAGNOSIS — I619 Nontraumatic intracerebral hemorrhage, unspecified: Secondary | ICD-10-CM | POA: Diagnosis not present

## 2017-08-16 DIAGNOSIS — E860 Dehydration: Secondary | ICD-10-CM | POA: Diagnosis present

## 2017-08-16 DIAGNOSIS — R338 Other retention of urine: Secondary | ICD-10-CM | POA: Diagnosis present

## 2017-08-16 DIAGNOSIS — R410 Disorientation, unspecified: Secondary | ICD-10-CM | POA: Diagnosis not present

## 2017-08-16 DIAGNOSIS — I69322 Dysarthria following cerebral infarction: Secondary | ICD-10-CM | POA: Diagnosis not present

## 2017-08-16 DIAGNOSIS — G464 Cerebellar stroke syndrome: Secondary | ICD-10-CM | POA: Diagnosis not present

## 2017-08-16 DIAGNOSIS — E870 Hyperosmolality and hypernatremia: Secondary | ICD-10-CM | POA: Diagnosis not present

## 2017-08-16 DIAGNOSIS — E785 Hyperlipidemia, unspecified: Secondary | ICD-10-CM | POA: Diagnosis present

## 2017-08-16 DIAGNOSIS — I69391 Dysphagia following cerebral infarction: Secondary | ICD-10-CM | POA: Diagnosis not present

## 2017-08-16 DIAGNOSIS — Z4659 Encounter for fitting and adjustment of other gastrointestinal appliance and device: Secondary | ICD-10-CM

## 2017-08-16 DIAGNOSIS — N401 Enlarged prostate with lower urinary tract symptoms: Secondary | ICD-10-CM | POA: Diagnosis present

## 2017-08-16 DIAGNOSIS — K219 Gastro-esophageal reflux disease without esophagitis: Secondary | ICD-10-CM | POA: Diagnosis present

## 2017-08-16 DIAGNOSIS — R0602 Shortness of breath: Secondary | ICD-10-CM | POA: Diagnosis not present

## 2017-08-16 DIAGNOSIS — D649 Anemia, unspecified: Secondary | ICD-10-CM | POA: Diagnosis present

## 2017-08-16 DIAGNOSIS — I639 Cerebral infarction, unspecified: Secondary | ICD-10-CM | POA: Diagnosis present

## 2017-08-16 DIAGNOSIS — R29718 NIHSS score 18: Secondary | ICD-10-CM | POA: Diagnosis present

## 2017-08-16 DIAGNOSIS — I629 Nontraumatic intracranial hemorrhage, unspecified: Secondary | ICD-10-CM | POA: Diagnosis not present

## 2017-08-16 DIAGNOSIS — G935 Compression of brain: Secondary | ICD-10-CM | POA: Diagnosis present

## 2017-08-16 DIAGNOSIS — I482 Chronic atrial fibrillation: Secondary | ICD-10-CM | POA: Diagnosis not present

## 2017-08-16 DIAGNOSIS — M6281 Muscle weakness (generalized): Secondary | ICD-10-CM | POA: Diagnosis not present

## 2017-08-16 DIAGNOSIS — E7800 Pure hypercholesterolemia, unspecified: Secondary | ICD-10-CM | POA: Diagnosis present

## 2017-08-16 DIAGNOSIS — R8271 Bacteriuria: Secondary | ICD-10-CM | POA: Diagnosis present

## 2017-08-16 DIAGNOSIS — D72829 Elevated white blood cell count, unspecified: Secondary | ICD-10-CM

## 2017-08-16 DIAGNOSIS — Z66 Do not resuscitate: Secondary | ICD-10-CM | POA: Diagnosis present

## 2017-08-16 DIAGNOSIS — J80 Acute respiratory distress syndrome: Secondary | ICD-10-CM | POA: Diagnosis not present

## 2017-08-16 DIAGNOSIS — I610 Nontraumatic intracerebral hemorrhage in hemisphere, subcortical: Secondary | ICD-10-CM | POA: Diagnosis not present

## 2017-08-16 DIAGNOSIS — R29810 Facial weakness: Secondary | ICD-10-CM | POA: Diagnosis present

## 2017-08-16 DIAGNOSIS — S065XAA Traumatic subdural hemorrhage with loss of consciousness status unknown, initial encounter: Secondary | ICD-10-CM | POA: Diagnosis present

## 2017-08-16 DIAGNOSIS — R918 Other nonspecific abnormal finding of lung field: Secondary | ICD-10-CM | POA: Diagnosis not present

## 2017-08-16 DIAGNOSIS — F419 Anxiety disorder, unspecified: Secondary | ICD-10-CM | POA: Diagnosis present

## 2017-08-16 LAB — COMPREHENSIVE METABOLIC PANEL
ALT: 27 U/L (ref 17–63)
ANION GAP: 8 (ref 5–15)
AST: 23 U/L (ref 15–41)
Albumin: 3.8 g/dL (ref 3.5–5.0)
Alkaline Phosphatase: 74 U/L (ref 38–126)
BUN: 15 mg/dL (ref 6–20)
CALCIUM: 8.8 mg/dL — AB (ref 8.9–10.3)
CHLORIDE: 103 mmol/L (ref 101–111)
CO2: 25 mmol/L (ref 22–32)
CREATININE: 1.16 mg/dL (ref 0.61–1.24)
GFR, EST NON AFRICAN AMERICAN: 57 mL/min — AB (ref >60)
Glucose, Bld: 80 mg/dL (ref 65–99)
Potassium: 3.5 mmol/L (ref 3.5–5.1)
Sodium: 136 mmol/L (ref 135–145)
Total Bilirubin: 0.9 mg/dL (ref 0.3–1.2)
Total Protein: 6.7 g/dL (ref 6.5–8.1)

## 2017-08-16 LAB — CBC
HEMATOCRIT: 39.8 % (ref 39.0–52.0)
Hemoglobin: 13.2 g/dL (ref 13.0–17.0)
MCH: 30.6 pg (ref 26.0–34.0)
MCHC: 33.2 g/dL (ref 30.0–36.0)
MCV: 92.3 fL (ref 78.0–100.0)
PLATELETS: 245 10*3/uL (ref 150–400)
RBC: 4.31 MIL/uL (ref 4.22–5.81)
RDW: 13.5 % (ref 11.5–15.5)
WBC: 7.3 10*3/uL (ref 4.0–10.5)

## 2017-08-16 LAB — I-STAT CHEM 8, ED
BUN: 18 mg/dL (ref 6–20)
Calcium, Ion: 1.12 mmol/L — ABNORMAL LOW (ref 1.15–1.40)
Chloride: 104 mmol/L (ref 101–111)
Creatinine, Ser: 1.1 mg/dL (ref 0.61–1.24)
GLUCOSE: 80 mg/dL (ref 65–99)
HCT: 40 % (ref 39.0–52.0)
HEMOGLOBIN: 13.6 g/dL (ref 13.0–17.0)
Potassium: 3.7 mmol/L (ref 3.5–5.1)
Sodium: 142 mmol/L (ref 135–145)
TCO2: 25 mmol/L (ref 22–32)

## 2017-08-16 LAB — DIFFERENTIAL
Basophils Absolute: 0 10*3/uL (ref 0.0–0.1)
Basophils Relative: 1 %
EOS PCT: 4 %
Eosinophils Absolute: 0.3 10*3/uL (ref 0.0–0.7)
Lymphocytes Relative: 28 %
Lymphs Abs: 2.1 10*3/uL (ref 0.7–4.0)
MONO ABS: 1.1 10*3/uL — AB (ref 0.1–1.0)
MONOS PCT: 15 %
NEUTROS ABS: 3.9 10*3/uL (ref 1.7–7.7)
Neutrophils Relative %: 52 %

## 2017-08-16 LAB — CBG MONITORING, ED: GLUCOSE-CAPILLARY: 68 mg/dL (ref 65–99)

## 2017-08-16 LAB — PROTIME-INR
INR: 1.04
PROTHROMBIN TIME: 13.5 s (ref 11.4–15.2)

## 2017-08-16 LAB — APTT: aPTT: 38 seconds — ABNORMAL HIGH (ref 24–36)

## 2017-08-16 LAB — I-STAT TROPONIN, ED: TROPONIN I, POC: 0.01 ng/mL (ref 0.00–0.08)

## 2017-08-16 MED ORDER — ACETAMINOPHEN 650 MG RE SUPP
650.0000 mg | RECTAL | Status: DC | PRN
  Administered 2017-08-18 – 2017-08-23 (×2): 650 mg via RECTAL
  Filled 2017-08-16 (×3): qty 1

## 2017-08-16 MED ORDER — STROKE: EARLY STAGES OF RECOVERY BOOK
Freq: Once | Status: DC
  Filled 2017-08-16: qty 1

## 2017-08-16 MED ORDER — NICARDIPINE HCL IN NACL 20-0.86 MG/200ML-% IV SOLN
0.0000 mg/h | INTRAVENOUS | Status: DC
  Administered 2017-08-16 – 2017-08-17 (×3): 5 mg/h via INTRAVENOUS
  Administered 2017-08-18: 10 mg/h via INTRAVENOUS
  Administered 2017-08-18: 5 mg/h via INTRAVENOUS
  Filled 2017-08-16 (×5): qty 200

## 2017-08-16 MED ORDER — LABETALOL HCL 5 MG/ML IV SOLN: 20.0000 mg | Freq: Once | INTRAVENOUS | Status: DC

## 2017-08-16 MED ORDER — NICARDIPINE HCL IN NACL 20-0.86 MG/200ML-% IV SOLN
3.0000 mg/h | INTRAVENOUS | Status: DC
Start: 2017-08-16 — End: 2017-08-16

## 2017-08-16 MED ORDER — ACETAMINOPHEN 160 MG/5ML PO SOLN
650.0000 mg | ORAL | Status: DC | PRN
  Administered 2017-08-18 – 2017-08-24 (×4): 650 mg
  Filled 2017-08-16 (×5): qty 20.3

## 2017-08-16 MED ORDER — NICARDIPINE HCL IN NACL 20-0.86 MG/200ML-% IV SOLN
INTRAVENOUS | Status: AC
  Administered 2017-08-17: 5 mg/h via INTRAVENOUS
  Filled 2017-08-16: qty 200

## 2017-08-16 MED ORDER — PANTOPRAZOLE SODIUM 40 MG IV SOLR
40.0000 mg | Freq: Every day | INTRAVENOUS | Status: DC
  Administered 2017-08-17 – 2017-08-20 (×5): 40 mg via INTRAVENOUS
  Filled 2017-08-16 (×5): qty 40

## 2017-08-16 MED ORDER — PROTHROMBIN COMPLEX CONC HUMAN 500 UNITS IV KIT
4336.0000 [IU] | PACK | INTRAVENOUS | Status: AC
  Administered 2017-08-16: 4336 [IU] via INTRAVENOUS
  Filled 2017-08-16: qty 4336

## 2017-08-16 MED ORDER — ACETAMINOPHEN 325 MG PO TABS
650.0000 mg | ORAL_TABLET | ORAL | Status: DC | PRN
  Administered 2017-08-20 – 2017-08-24 (×3): 650 mg via ORAL
  Filled 2017-08-16 (×2): qty 2

## 2017-08-16 MED ORDER — SENNOSIDES-DOCUSATE SODIUM 8.6-50 MG PO TABS
1.0000 | ORAL_TABLET | Freq: Two times a day (BID) | ORAL | Status: DC
  Administered 2017-08-18 – 2017-08-20 (×6): 1 via ORAL
  Filled 2017-08-16 (×8): qty 1

## 2017-08-16 NOTE — ED Triage Notes (Signed)
Per EMS pt drove to girlfriends at 7pm but was confused on location patient arrived to girlfiriends house and had dinner and then noted sudden onset of left side weakness and became aphasic

## 2017-08-16 NOTE — H&P (Signed)
Stroke H&P  Reason for Consult: Code stroke Referring Physician: Dr. Rhunette Croft  CC: Left-sided weakness  History is obtained from: Patient, EMS, family later on  HPI: [name] is a 84 y.o. male past medical history of old stroke with no residual deficit, atrial fibrillation on Eliquis last dose taken the morning of 08/16/2017, witnessed last known normal day or so ago but drove to his lady friend's house this evening around 7 PM for dinner and was not behaving like his normal self.  Some point during that time he started having left-sided weakness for which EMS was called.  When EMS arrived, his systolic blood pressure was in the 220s-30s.  His fingerstick glucose was within normal range. He was brought in as an acute code stroke to the emergency room at [facility] by [facility].  He had also complained of headache at that time. Upon initial evaluation, he had a fixed rightward gaze, left upper extremity plegia, left lower extremity paresis, dense sensory loss and neglect of the left side. He was taken for noncontrast CT of the head that showed a right temporal ICH with IVH.  LKW: At best 7 PM on 08/16/2017, could have been a day or so before that-history unclear tpa given?: no, ICH Premorbid modified Rankin scale (mRS): 0  ICH score 2-1 for intraventricular, 1 for age over 37  ROS: Review of Systems  Constitutional: Negative for chills and fever.  HENT: Negative for hearing loss and tinnitus.   Eyes: Negative for blurred vision and double vision.  Respiratory: Negative for cough and hemoptysis.   Cardiovascular: Negative for chest pain and palpitations.  Gastrointestinal: Negative for heartburn and nausea.  Genitourinary: Negative for dysuria and urgency.  Musculoskeletal: Negative for myalgias and neck pain.  Skin: Negative for itching and rash.  Neurological: Positive for focal weakness (Details in HPI) and headaches.  Endo/Heme/Allergies: Negative for  environmental allergies. Does not bruise/bleed easily.  Psychiatric/Behavioral: Negative for depression and suicidal ideas.     Past Medical History:  Diagnosis Date  . A-fib (HCC)   . GERD (gastroesophageal reflux disease)   . Hypercholesteremia   . Stroke [facility])     Family History  Problem Relation Age of Onset  . Hypertension Father     Social History:   reports that  has never smoked. he has never used smokeless tobacco. He reports that he does not drink alcohol or use drugs.  Medications  Current Facility-Administered Medications:  .   stroke: mapping our early stages of recovery book, , Does not apply, Once, Milon Dikes, MD .  acetaminophen (TYLENOL) tablet 650 mg, 650 mg, Oral, Q4H PRN **OR** acetaminophen (TYLENOL) solution 650 mg, 650 mg, Per Tube, Q4H PRN **OR** acetaminophen (TYLENOL) suppository 650 mg, 650 mg, Rectal, Q4H PRN, Milon Dikes, MD .  labetalol (NORMODYNE,TRANDATE) injection 20 mg, 20 mg, Intravenous, Once **AND** nicardipine (CARDENE) 20mg  in 0.86% saline IV infusion (0.1 mg/ml), 0-15 mg/hr, Intravenous, Continuous, Milon Dikes, MD .  niCARdipine in saline (CARDENE-IV) 20-0.86 MG/200ML-% infusion SOLN, , , ,  .  pantoprazole (PROTONIX) injection 40 mg, 40 mg, Intravenous, QHS, Milon Dikes, MD .  prothrombin complex conc human (KCENTRA) IVPB 4,336 Units, 4,336 Units, Intravenous, STAT, Milon Dikes, MD .  senna-docusate (Senokot-S) tablet 1 tablet, 1 tablet, Oral, BID, Milon Dikes, MD  Current Outpatient Medications:  .  apixaban (ELIQUIS) 5 MG TABS tablet, Take 1 tablet (5 mg total) by mouth 2 (two) times daily., Disp: 60 tablet, Rfl: 0 .  aspirin EC 81 MG tablet, Take 81 mg by mouth daily., Disp: , Rfl:  .  atorvastatin (LIPITOR) 80 MG tablet, Take 1 tablet (80 mg total) by mouth daily., Disp: 30 tablet, Rfl: 0 .  Cholecalciferol 1000 units TBDP, Take 1 tablet by mouth daily., Disp: , Rfl:  .  cyclobenzaprine (FLEXERIL) 5 MG tablet, Take 1  tablet (5 mg total) by mouth 3 (three) times daily as needed for muscle spasms., Disp: 30 tablet, Rfl: 0 .  docusate sodium (COLACE) 100 MG capsule, Take 1 capsule (100 mg total) by mouth daily as needed for mild constipation., Disp: 30 capsule, Rfl: 0 .  ferrous sulfate 325 (65 FE) MG tablet, Take 1 tablet (325 mg total) by mouth daily with breakfast., Disp: 30 tablet, Rfl: 0 .  finasteride (PROSCAR) 5 MG tablet, Take 1 tablet (5 mg total) by mouth daily., Disp: 30 tablet, Rfl: 0 .  Menthol-Methyl Salicylate (MUSCLE RUB) 10-15 % CREA, Apply 1 application topically as needed for muscle pain. To neck and shoulder, Disp: , Rfl: 0 .  omeprazole (PRILOSEC) 20 MG capsule, Take 20 mg by mouth daily., Disp: , Rfl:  .  QUEtiapine (SEROQUEL) 25 MG tablet, Take 0.5 tablets (12.5 mg total) by mouth at bedtime., Disp: 15 tablet, Rfl: 0 .  sertraline (ZOLOFT) 25 MG tablet, Take 1 tablet (25 mg total) by mouth daily., Disp: 30 tablet, Rfl: 0   Exam: Current vital signs: BP (!) 188/76   Pulse (!) 59   Temp 97.6 F (36.4 C) (Oral)   Resp 18   Ht 5\' 11"  (1.803 m)   Wt 89 kg (196 lb 3.4 oz)   SpO2 98%   BMI 27.37 kg/m  Vital signs in last 24 hours: Temp:  [97.6 F (36.4 C)] 97.6 F (36.4 C) (01/10 2258) Pulse Rate:  [59] 59 (01/10 2258) Resp:  [18] 18 (01/10 2258) BP: (188)/(76) 188/76 (01/10 2258) SpO2:  [98 %] 98 % (01/10 2258) Weight:  [89 kg (196 lb 3.4 oz)] 89 kg (196 lb 3.4 oz) (01/10 2258) General: Awake alert oriented x3 HEENT: Normocephalic atraumatic Lungs clear to auscultation bilaterally Cardiovascular -S1-S2 heard, irregularly irregular Abdomen: Nondistended nontender Extremities warm well perfused Neurological exam Mental status: Awake alert oriented x3 Speech is severely dysarthric.  Naming comprehension and repetition is intact. Cranial nerves: Pupils equal round reactive to light, post right gaze deviation, no blink to threat from the left, intact blink from the right, right  lower facial weakness. Motor exam: 0/5 left upper extremity, 2/5 left lower extremity, 5/5 right upper and lower extremities.  Normal tone normal range of motion in all 4 extremities Sensory exam: Dense sensory loss on the left hemibody.. Neglecting the left side.  Unable to recognize his own hand on the left Intact finger-nose-finger on the right.  Unable to perform on the left. Gait exam was deferred due to his hemiplegia NIHSS 1a Level of Conscious.:  0 1b LOC Questions: 0 1c LOC Commands: 0 2 Best Gaze: 2 3 Visual: 2 4 Facial Palsy: 2 5a Motor Arm - left: 4 5b Motor Arm - Right: 0 6a Motor Leg - Left: 2 6b Motor Leg - Right: 0 7 Limb Ataxia: 0 8 Sensory: 2 9 Best Language: 0 10 Dysarthria: 2 11 Extinct. and Inatten.:  2 TOTAL: 18  Labs I have reviewed labs in epic and the results pertinent to this consultation are:  CBC    Component Value Date/Time   WBC 7.3 03/06/2017 1731  RBC 4.45 03/06/2017 1731   HGB 13.6 08/16/2017 2250   HGB 14.6 06/27/2013 0234   HCT 40.0 08/16/2017 2250   HCT 42.8 06/27/2013 0234   PLT 362 03/06/2017 1731   PLT 275 06/27/2013 0234   MCV 86.8 03/06/2017 1731   MCV 91 06/27/2013 0234   MCH 28.9 03/06/2017 1731   MCHC 33.2 03/06/2017 1731   RDW 16.4 (H) 03/06/2017 1731   RDW 14.0 06/27/2013 0234   LYMPHSABS 1.3 02/16/2017 0507   LYMPHSABS 1.4 06/27/2013 0234   MONOABS 1.1 (H) 02/16/2017 0507   MONOABS 0.7 06/27/2013 0234   EOSABS 0.2 02/16/2017 0507   EOSABS 0.1 06/27/2013 0234   BASOSABS 0.0 02/16/2017 0507   BASOSABS 0.0 06/27/2013 0234    CMP     Component Value Date/Time   NA 142 08/16/2017 2250   NA 140 06/27/2013 0234   K 3.7 08/16/2017 2250   K 3.9 06/27/2013 0234   CL 104 08/16/2017 2250   CL 108 (H) 06/27/2013 0234   CO2 27 03/06/2017 1731   CO2 26 06/27/2013 0234   GLUCOSE 80 08/16/2017 2250   GLUCOSE 130 (H) 06/27/2013 0234   BUN 18 08/16/2017 2250   BUN 29 (H) 06/27/2013 0234   CREATININE 1.10 08/16/2017 2250    CREATININE 1.35 (H) 06/27/2013 0234   CALCIUM 9.1 03/06/2017 1731   CALCIUM 9.0 06/27/2013 0234   PROT 6.8 02/16/2017 0507   PROT 7.4 06/27/2013 0234   ALBUMIN 3.1 (L) 02/16/2017 0507   ALBUMIN 3.9 06/27/2013 0234   AST 39 02/16/2017 0507   AST 29 06/27/2013 0234   ALT 50 02/16/2017 0507   ALT 31 06/27/2013 0234   ALKPHOS 105 02/16/2017 0507   ALKPHOS 90 06/27/2013 0234   BILITOT 0.8 02/16/2017 0507   BILITOT 0.5 06/27/2013 0234   GFRNONAA >60 03/06/2017 1731   GFRNONAA 51 (L) 06/27/2013 0234   GFRAA >60 03/06/2017 1731   GFRAA 59 (L) 06/27/2013 0234    Imaging I have reviewed the images obtained:  CT-scan of the brain -right temporal lobe ICH with IVH.  Midline shift with impending subfalcine herniation of the anterior singlet gyrus.  Assessment:  84 year old man with a past medical history of atrial fibrillation on Eliquis presented for evaluation of acute onset of left-sided weakness, left facial droop, slurred speech and rightward gaze deviation. Examination revealed an NIH stroke scale of 18. CT scan shows right temporal lobe ICH with IVH-most likely hypertensive in the setting of underlying coagulopathy by Eliquis.  Impression: Right temporal lobe ICH with IVH-etiology likely hypertensive in the setting of coagulopathy because of Eliquis use Atrial fibrillation  Plan: Subcortical ICH, nontraumatic Cortical ICH, nontraumatic IVH, nontraumatic  Acuity: Acute Laterality: right Current suspected etiology:  HTN, Coagulopathy Treatment: -Admit to NICU -ICH Score: 2 -ICH Volume: ~10cc -BP control goal SYS<140 -No need for NSGY consult for now. -HTS with Na goal 145-150 -PT/OT/ST  -neuromonitoring -central line - requested ER attending who has graciously agreed to put a femoral line -reversal of eliquis with K-centra per pharmacy protocol -d/c Eliquis  CNS Cerebral edema Compression of brain -Hyperosmolar therapy as abov -Close neuro  monitoring  Dysarthria Dysphagia following ICH  -NPO until cleared by speech -ST -Advance diet as tolerated -May need PEG  RESP DNR  -monotor clinically  CV Hypertensive Encephalopathy Essential (primary) hypertension Hypertensive Emergency -Aggressive BP control, goal SBP < 140 labetalol PRN and Cardene gtt -TTE  Paroxysmal atrial -fibrillation -Rate control -will appreciate PCCM consult for medical  management assistance while in ICU  GI/GU -Cr elevated - likley AKI -Gentle hydration -PCCM consult  HEME Coagulopathy secondary to anticoagulation -Reverse with PCC -Trend PT/PTT/INR  ENDO -goal HgbA1c < 7  Fluid/Electrolyte Disorders -Replete -Repeat labs  ID Possible Aspiration PNA -CXR -NPO -Monitor  Prophylaxis DVT:  scd GI: NA Bowel: doc senna   Dispo: pending clinical course  Diet: NPO until cleared by speech  Code Status:DNR  POA: Hemiplegia, hypertensive encephalopathy, hemiparesis, intracerebral hemorrhage, possible aspiration pneumonia, coagulopathy secondary to Eliquis, DNR  -- Milon Dikes, MD [facility] Pager: [phone] If 7pm to 7am, please call on call as listed on AMION.   CRITICAL CARE ATTESTATION This patient is critically ill and at significant risk of neurological worsening, death and care requires constant monitoring of vital signs, hemodynamics,respiratory and cardiac monitoring. I spent 55  minutes of neurocritical care time performing neurological assessment, discussion with family, other specialists and medical decision making of high complexityin the care of  this patient.

## 2017-08-17 ENCOUNTER — Inpatient Hospital Stay (HOSPITAL_COMMUNITY): Payer: Medicare Other

## 2017-08-17 DIAGNOSIS — I610 Nontraumatic intracerebral hemorrhage in hemisphere, subcortical: Secondary | ICD-10-CM

## 2017-08-17 DIAGNOSIS — R471 Dysarthria and anarthria: Secondary | ICD-10-CM

## 2017-08-17 DIAGNOSIS — S065XAA Traumatic subdural hemorrhage with loss of consciousness status unknown, initial encounter: Secondary | ICD-10-CM | POA: Diagnosis present

## 2017-08-17 DIAGNOSIS — R8281 Pyuria: Secondary | ICD-10-CM

## 2017-08-17 DIAGNOSIS — I639 Cerebral infarction, unspecified: Secondary | ICD-10-CM | POA: Diagnosis present

## 2017-08-17 DIAGNOSIS — S065X9A Traumatic subdural hemorrhage with loss of consciousness of unspecified duration, initial encounter: Secondary | ICD-10-CM | POA: Diagnosis present

## 2017-08-17 DIAGNOSIS — R8271 Bacteriuria: Secondary | ICD-10-CM | POA: Diagnosis present

## 2017-08-17 LAB — SODIUM
SODIUM: 139 mmol/L (ref 135–145)
SODIUM: 148 mmol/L — AB (ref 135–145)
SODIUM: 143 mmol/L (ref 135–145)
SODIUM: 167 mmol/L — AB (ref 135–145)

## 2017-08-17 LAB — URINALYSIS, ROUTINE W REFLEX MICROSCOPIC
Bilirubin Urine: NEGATIVE
Glucose, UA: NEGATIVE mg/dL
Ketones, ur: NEGATIVE mg/dL
Nitrite: NEGATIVE
PROTEIN: NEGATIVE mg/dL
SQUAMOUS EPITHELIAL / LPF: NONE SEEN
Specific Gravity, Urine: 1.012 (ref 1.005–1.030)
pH: 7 (ref 5.0–8.0)

## 2017-08-17 LAB — MRSA PCR SCREENING: MRSA by PCR: POSITIVE — AB

## 2017-08-17 MED ORDER — QUETIAPINE FUMARATE 25 MG PO TABS
12.5000 mg | ORAL_TABLET | Freq: Two times a day (BID) | ORAL | Status: DC
  Administered 2017-08-17: 12.5 mg via ORAL
  Filled 2017-08-17: qty 1

## 2017-08-17 MED ORDER — FINASTERIDE 5 MG PO TABS
5.0000 mg | ORAL_TABLET | Freq: Every day | ORAL | Status: DC
  Administered 2017-08-17 – 2017-08-25 (×8): 5 mg via ORAL
  Filled 2017-08-17 (×9): qty 1

## 2017-08-17 MED ORDER — SODIUM CHLORIDE 3 % IV SOLN
INTRAVENOUS | Status: DC
  Administered 2017-08-17: 85 mL/h via INTRAVENOUS
  Administered 2017-08-17: 75 mL/h via INTRAVENOUS
  Administered 2017-08-17: 85 mL/h via INTRAVENOUS
  Administered 2017-08-18: 90 mL/h via INTRAVENOUS
  Administered 2017-08-18 – 2017-08-19 (×2): 45 mL/h via INTRAVENOUS
  Filled 2017-08-17 (×14): qty 500

## 2017-08-17 MED ORDER — QUETIAPINE FUMARATE 25 MG PO TABS
25.0000 mg | ORAL_TABLET | Freq: Every day | ORAL | Status: DC
  Administered 2017-08-18: 25 mg via ORAL
  Filled 2017-08-17 (×2): qty 1

## 2017-08-17 MED ORDER — MIDAZOLAM HCL 2 MG/2ML IJ SOLN
2.0000 mg | Freq: Once | INTRAMUSCULAR | Status: DC
  Filled 2017-08-17: qty 2

## 2017-08-17 MED ORDER — MIDAZOLAM HCL 2 MG/2ML IJ SOLN: 2.0000 mg | Freq: Once | INTRAMUSCULAR | Status: DC

## 2017-08-17 MED ORDER — TAMSULOSIN HCL 0.4 MG PO CAPS
0.4000 mg | ORAL_CAPSULE | Freq: Every day | ORAL | Status: DC
  Administered 2017-08-18 – 2017-08-24 (×5): 0.4 mg via ORAL
  Filled 2017-08-17 (×7): qty 1

## 2017-08-17 MED ORDER — LORAZEPAM 2 MG/ML IJ SOLN
2.0000 mg | INTRAMUSCULAR | Status: DC | PRN
  Administered 2017-08-17 – 2017-08-19 (×5): 2 mg via INTRAVENOUS
  Filled 2017-08-17 (×5): qty 1

## 2017-08-17 MED ORDER — QUETIAPINE FUMARATE 25 MG PO TABS: 12.5000 mg | ORAL_TABLET | Freq: Every morning | ORAL | Status: DC

## 2017-08-17 MED ORDER — SODIUM CHLORIDE 0.9 % IV SOLN
1.0000 g | Freq: Once | INTRAVENOUS | Status: AC
  Administered 2017-08-17: 1 g via INTRAVENOUS
  Filled 2017-08-17: qty 10

## 2017-08-17 MED ORDER — SODIUM CHLORIDE 0.9% FLUSH
10.0000 mL | INTRAVENOUS | Status: DC | PRN
  Administered 2017-08-22: 10 mL
  Administered 2017-08-24: 20 mL
  Filled 2017-08-17 (×2): qty 40

## 2017-08-17 MED ORDER — CHLORHEXIDINE GLUCONATE CLOTH 2 % EX PADS
6.0000 | MEDICATED_PAD | Freq: Every day | CUTANEOUS | Status: DC
  Administered 2017-08-17 – 2017-08-20 (×4): 6 via TOPICAL

## 2017-08-17 MED ORDER — SODIUM CHLORIDE 0.9% FLUSH
10.0000 mL | Freq: Two times a day (BID) | INTRAVENOUS | Status: DC
  Administered 2017-08-17 – 2017-08-24 (×10): 10 mL
  Administered 2017-08-24: 30 mL

## 2017-08-17 NOTE — Plan of Care (Signed)
Pt able to protect airway, SPO2 100% on RA. Pt able to move all extremities and assist w/ turning in bed. Pt denies any pain. SLP evaluated pt for appropriate diet.

## 2017-08-17 NOTE — Progress Notes (Signed)
Peripherally Inserted Central Catheter/Midline Placement  The IV Nurse has discussed with the patient and/or persons authorized to consent for the patient, the purpose of this procedure and the potential benefits and risks involved with this procedure.  The benefits include less needle sticks, lab draws from the catheter, and the patient may be discharged home with the catheter. Risks include, but not limited to, infection, bleeding, blood clot (thrombus formation), and puncture of an artery; nerve damage and irregular heartbeat and possibility to perform a PICC exchange if needed/ordered by physician.  Alternatives to this procedure were also discussed.  Bard Power PICC patient education guide, fact sheet on infection prevention and patient information card has been provided to patient /or left at bedside.    PICC/Midline Placement Documentation  PICC Double Lumen 08/17/17 PICC Right Basilic 41 cm 0 cm (Active)  Indication for Insertion or Continuance of Line Prolonged intravenous therapies 08/17/2017  3:00 PM  Exposed Catheter (cm) 0 cm 08/17/2017  3:00 PM  Site Assessment Clean;Dry;Intact 08/17/2017  3:00 PM  Lumen #1 Status Flushed;Saline locked;Blood return noted 08/17/2017  3:00 PM  Lumen #2 Status Flushed;Saline locked;Blood return noted 08/17/2017  3:00 PM  Dressing Type Transparent;Securing device 08/17/2017  3:00 PM  Dressing Status Clean;Dry;Intact;Antimicrobial disc in place 08/17/2017  3:00 PM  Dressing Change Due 08/24/17 08/17/2017  3:00 PM   PICC placed by Reginia FortsMarilyn Lumban RN    [name], [name] 08/17/2017, 3:03 PM

## 2017-08-17 NOTE — Progress Notes (Signed)
PT Cancellation Note  Patient Details Name: [name] MRN: [id number] DOB: Nov 12, 1933   Cancelled Treatment:    Reason Eval/Treat Not Completed: Other (comment).  Pt is now off of bed rest, however, he has a femoral line which will need to be removed before therapy can mobilize him.  PT will check back later as time allows.   Thanks,    Rollene Rotundaebecca B. Matix Henshaw, PT, DPT [phone]   08/17/2017, 11:52 AM

## 2017-08-17 NOTE — Progress Notes (Signed)
OT Evaluation  PTA, pt lived alone and was modified independent with ADL and mobility @ cane level. Family assisted with medication management and finances, however, pt drove, completed his grocery shopping and enjoyed spending time with his lady friend "Programmer, [employer]". Evaluation most likely affected by medication, however, pt required Max A +2 for mobility and total A for ADL. If pt begins to progress, he will benefit form rehab at [facility] to facilitate DC home with family. Pt will most likely require 24/7 assist after DC. Will follow acutely to address established goals and facilitate DC to next venue of care.    08/17/17 1800  OT Visit Information  Last OT Received On 08/17/17  Assistance Needed +2  PT/OT/SLP Co-Evaluation/Treatment Yes  Reason for Co-Treatment Complexity of the patient's impairments (multi-system involvement);Necessary to address cognition/behavior during functional activity;For patient/therapist safety;To address functional/ADL transfers  OT goals addressed during session ADL's and self-care  History of Present Illness 84 y.o. male admitted on 08/16/17 for left sided weakness.  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  Pt with significant PMH of stroke, A-fib, and L THA.  Precautions  Precautions Fall  Home Living  Family/patient expects to be discharged to: Private residence  Living Arrangements Alone  Available Help at Discharge Family;Available 24 hours/day  Type of Home House  Home Access Stairs to enter  Entrance Stairs-Number of Steps 3 to enter with hand rails on both sides  Entrance Stairs-Rails Right;Left;Can reach both  Home Layout One level;Other (Comment) (4 steps to kitchen with rails)  Bathroom Shower/Tub Tub/shower unit;Curtain  Bathroom Toilet Handicapped height  Bathroom Accessibility Yes  How Accessible Accessible via walker  Home Equipment Cane - single point;Walker - 2  wheels;BSC;Walker - 4 wheels  Prior Function  Level of Independence Independent with assistive device(s)  Comments pt drove and did hsi won grocery shopping; fmaily assisted with medication management and finanaces; Has a "lady firendBest boy who he visits 2-3 times/wk  Communication  Communication HOH;Expressive difficulties  Pain Assessment  Pain Assessment Faces  Faces Pain Scale 0  Cognition  Arousal/Alertness Lethargic  Behavior During Therapy Restless;Impulsive  Overall Cognitive Status Impaired/Different from baseline  Area of Impairment Orientation;Attention;Memory;Following commands;Safety/judgement;Awareness;Problem solving  Orientation Level Disoriented to;Place;Time;Situation  Current Attention Level Focused (sustained to washing face)  Memory Decreased recall of precautions;Decreased short-term memory  Following Commands Follows one step commands inconsistently  Safety/Judgement Decreased awareness of safety;Decreased awareness of deficits  Awareness Intellectual  Problem Solving Slow processing;Decreased initiation;Difficulty sequencing;Requires verbal cues;Requires tactile cues  General Comments Pt given toothbrush and required hand over hand to initate use. Once pt started brushing teeth, he then began using it as a razor  Upper Extremity Assessment  Upper Extremity Assessment LUE deficits/detail;RUE deficits/detail  RUE Deficits / Details generalized weakness  LUE Deficits / Details Attempting to use LUE with apparent difficulty; will further assess  Lower Extremity Assessment  Lower Extremity Assessment Defer to PT evaluation  Cervical / Trunk Assessment  Cervical / Trunk Assessment Other exceptions  Cervical / Trunk Exceptions Posterior lean in sitting.   ADL  Overall ADL's  Needs assistance/impaired  Grooming Maximal assistance;Wash/dry face;Oral care  Functional mobility during ADLs Maximal assistance;+2 for physical assistance  General ADL Comments Pt overall  total A at this time; affected by level of arousal as pt had sedating meds prior to session  Vision- History  Baseline Vision/History Wears glasses  Wears Glasses At all times  Vision-  Assessment  Vision Assessment? Yes  Ocular Range of Motion Impaired-to be further tested in functional context (R gaze preference)  Alignment/Gaze Preference Head turned;Gaze right  Tracking/Visual Pursuits Impaired - to be further tested in functional context;Unable to hold eye position out of midline (no tracking at this time)  Saccades Impaired - to be further tested in functional context  Visual Fields Impaired-to be further tested in functional context;Left visual field deficit  Additional Comments R gaze preference . Poor visual attention  Perception  Spatial deficits L neglect  Praxis  Praxis tested? Deficits  Deficits Organization;Initiation  Praxis-Other Comments will further assess  Bed Mobility  Overal bed mobility Needs Assistance  Bed Mobility Supine to Sit;Sit to Supine  Supine to sit +2 for physical assistance;Max assist  Sit to supine +2 for physical assistance;Total assist  Transfers  Overall transfer level Needs assistance  Equipment used 2 person hand held assist  Transfers Sit to/from Stand  Sit to Stand +2 physical assistance;Total assist  General transfer comment Attempted stand x2 without much success as pt was not initiating any movement to help stand even after therapists initiated movement forward.   Balance  Overall balance assessment Needs assistance  Sitting-balance support Feet supported;Bilateral upper extremity supported;No upper extremity supported;Single extremity supported  Sitting balance-Leahy Scale Zero  Sitting balance - Comments max to total assist seated EOB.   Standing balance support Bilateral upper extremity supported  Standing balance-Leahy Scale Zero  Standing balance comment unable to fully stand with two person assist.   OT - End of Session   Activity Tolerance Patient limited by lethargy (limited by cognition)  Patient left in bed;with call bell/phone within reach;with bed alarm set;with SCD's reapplied  Nurse Communication Mobility status  OT Assessment  OT Recommendation/Assessment Patient needs continued OT Services  OT Visit Diagnosis Other abnormalities of gait and mobility (R26.89);Muscle weakness (generalized) (M62.81);Low vision, both eyes (H54.2);Other symptoms and signs involving cognitive function  OT Problem List Decreased strength;Decreased range of motion;Decreased activity tolerance;Impaired balance (sitting and/or standing);Impaired vision/perception;Decreased coordination;Decreased cognition;Decreased safety awareness;Decreased knowledge of use of DME or AE;Decreased knowledge of precautions;Impaired UE functional use  OT Plan  OT Frequency (ACUTE ONLY) Min 2X/week  OT Treatment/Interventions (ACUTE ONLY) Self-care/ADL training;Therapeutic exercise;Neuromuscular education;Energy conservation;DME and/or AE instruction;Therapeutic activities;Cognitive remediation/compensation;Visual/perceptual remediation/compensation;Patient/family education;Balance training  AM-PAC OT "6 Clicks" Daily Activity Outcome Measure  Help from another person eating meals? 1  Help from another person taking care of personal grooming? 1  Help from another person toileting, which includes using toliet, bedpan, or urinal? 1  Help from another person bathing (including washing, rinsing, drying)? 1  Help from another person to put on and taking off regular upper body clothing? 1  Help from another person to put on and taking off regular lower body clothing? 1  6 Click Score 6  ADL G Code Conversion CN  OT Recommendation  Recommendations for Other Services Rehab consult  Follow Up Recommendations [facility];Supervision/Assistance - 24 hour  OT Equipment Hospital bed;Wheelchair cushion (measurements OT);Wheelchair (measurements OT)  Individuals  Consulted  Consulted and Agree with Results and Recommendations Family member/caregiver  Family Member Consulted dtr Carolanne Grumbling)  Acute Rehab OT Goals  Patient Stated Goal dtr goal is for pt to return home with family if possible  OT Goal Formulation With family  Time For Goal Achievement 08/31/17  Potential to Achieve Goals Good  OT Time Calculation  OT Start Time (ACUTE ONLY) 1636  OT Stop Time (ACUTE ONLY) 1705  OT Time Calculation (min) 29  min  OT General Charges  $OT Visit 1 Visit  OT Evaluation  $OT Eval Moderate Complexity 1 Mod  Written Expression  Dominant Hand Right  Avacyn Kloosterman,[facility] OT/L  [phone] 08/17/2017

## 2017-08-17 NOTE — ED Notes (Signed)
Patient combative pulling out hand IV, and multiple attempts to stand up despite multiple attempts by staff and family to re-orient. RN requesting sedation. Provider requesting to hold sedation and will order mitts

## 2017-08-17 NOTE — Evaluation (Signed)
Physical Therapy Evaluation Patient Details Name: [name] MRN: [id number] DOB: July 09, 1934 Today's Date: 08/17/2017   History of Present Illness  84 y.o. male admitted on 08/16/17 for left sided weakness.  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  Pt with significant PMH of stroke, A-fib, and L THA.  Clinical Impression  Pt is more lethargic compared to when I saw him earlier this AM.  He continues to be restless, moving his legs over the right side of the bed trying to get up.  He will follow some basic commands with structure and familiar tasks.  His eyes were closed most of the session and he was confabulatory in much of his speech.  He would benefit from post acute rehab.   PT to follow acutely for deficits listed below.       Follow Up Recommendations [facility]    Equipment Recommendations  Wheelchair (measurements PT);Wheelchair cushion (measurements PT);Hospital bed    Recommendations for Other Services Rehab consult     Precautions / Restrictions Precautions Precautions: Fall      Mobility  Bed Mobility Overal bed mobility: Needs Assistance Bed Mobility: Supine to Sit;Sit to Supine     Supine to sit: +2 for physical assistance;Max assist Sit to supine: +2 for physical assistance;Total assist   General bed mobility comments: Two person max assist to get to EOB.  Assist to support trunk and bring both legs over EOB.  Assist needed to support trunk and lift legs back into bed to return to supine.   Transfers Overall transfer level: Needs assistance Equipment used: 2 person hand held assist Transfers: Sit to/from Stand Sit to Stand: +2 physical assistance;Total assist         General transfer comment: Attempted stand x2 without much success as pt was not initiating any movement to help stand even after therapists initiated movement forward.                 Pertinent Vitals/Pain Pain Assessment: Faces Faces Pain Scale: No hurt    Home Living Family/patient expects to be discharged to:: Private residence Living Arrangements: Alone Available Help at Discharge: Family;Available PRN/intermittently Type of Home: House                      Hand Dominance   Dominant Hand: Right(per chart)    Extremity/Trunk Assessment   Upper Extremity Assessment Upper Extremity Assessment: Defer to OT evaluation    Lower Extremity Assessment Lower Extremity Assessment: Difficult to assess due to impaired cognition(actively moving both legs, not consistantly to command)    Cervical / Trunk Assessment Cervical / Trunk Assessment: Other exceptions Cervical / Trunk Exceptions: Posterior lean in sitting.   Communication   Communication: HOH(per chart)  Cognition Arousal/Alertness: Awake/alert Behavior During Therapy: Restless;Impulsive Overall Cognitive Status: Impaired/Different from baseline Area of Impairment: Orientation;Attention;Memory;Following commands;Awareness;Safety/judgement;Problem solving                 Orientation Level: Disoriented to;Place;Time;Situation Current Attention Level: Sustained Memory: Decreased recall of precautions;Decreased short-term memory Following Commands: Follows one step commands inconsistently;Follows one step commands with increased time Safety/Judgement: Decreased awareness of safety;Decreased awareness of deficits Awareness: Intellectual Problem Solving: Decreased initiation;Difficulty sequencing;Requires verbal cues;Requires tactile cues;Slow processing General Comments: Pt confabulatory in much of his speech.  Requires assist to initiate movement to command, preforms some automatic tasks, but will switch to inappropriate use of object (seemingly shaving his face with  the tooth brush).  Pt had his eyes closed most of the session, would open them breifly and was often looking right.                Assessment/Plan    PT Assessment Patient needs continued PT services  PT Problem List Decreased strength;Decreased activity tolerance;Decreased cognition;Decreased knowledge of use of DME;Decreased coordination;Decreased mobility;Decreased safety awareness;Decreased knowledge of precautions       PT Treatment Interventions DME instruction;Gait training;Stair training;Functional mobility training;Therapeutic activities;Therapeutic exercise;Balance training;Neuromuscular re-education;Cognitive remediation;Patient/family education    PT Goals (Current goals can be found in the Care Plan section)  Acute Rehab PT Goals Patient Stated Goal: unable to state PT Goal Formulation: With patient Time For Goal Achievement: 08/31/17 Potential to Achieve Goals: Fair    Frequency Min 4X/week   Barriers to discharge Decreased caregiver support not sure of available help at discharge.     Co-evaluation PT/OT/SLP Co-Evaluation/Treatment: Yes Reason for Co-Treatment: Complexity of the patient's impairments (multi-system involvement);Necessary to address cognition/behavior during functional activity;For patient/therapist safety;To address functional/ADL transfers PT goals addressed during session: Mobility/safety with mobility;Balance         AM-PAC PT "6 Clicks" Daily Activity  Outcome Measure Difficulty turning over in bed (including adjusting bedclothes, sheets and blankets)?: Unable Difficulty moving from lying on back to sitting on the side of the bed? : Unable Difficulty sitting down on and standing up from a chair with arms (e.g., wheelchair, bedside commode, etc,.)?: Unable Help needed moving to and from a bed to chair (including a wheelchair)?: Total Help needed walking in hospital room?: Total Help needed climbing 3-5 steps with a railing? 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 Cleon Signorelli, PT, DPT [phone]   PT Evaluation $PT Eval Moderate Complexity: 1 Mod     08/17/2017, 5:19 PM

## 2017-08-17 NOTE — Consult Note (Signed)
[facility] Pulmonary Diseases & Critical Care Medicine Initial Consultation  Patient Name: [name] MRN: [id number] DOB: 12-01-1933    ADMISSION DATE:  08/16/2017 CONSULTATION DATE:  08/16/2017  REFERRING MD:  Delia Heady, MD  REASON FOR CONSULTATION:  Intraventricular hemorrhage   HISTORY OF PRESENT ILLNESS  This 84 y.o. Caucasian male is seen in consultation at the request of Dr. Delia Heady for recommendations on further evaluation and management of intraventricular hemorrhage. The patient was admitted with complaints of L sided weakness. EMS was called and found the patient to have SBP 200+. He was found by Dr. Pearlean Brownie to have R gaze preference, LUE paraplegi, LLE paresis and dense senory loss and neglect of the L side. Head CT showed R temporal ICH with IVH. We are consulted due to concerns for possible respiratory compromise.  At the time of clinical interview, the patient is unable to speak coherently. He has dysarthric speech that is also nonsensical, although not neologism. He does follow commands. His LUE is slightly weaker than the RUE. Otherwise, there is no definite   This patient is critically ill and cannot provide additional history due to Unable to speak.  REVIEW OF SYSTEMS Constitutional: No weight loss. No night sweats. No fever. No chills. No fatigue. HEENT: No headaches, dysphagia, sore throat, otalgia, nasal congestion, PND CV:  No chest pain, orthopnea, PND, swelling in lower extremities, palpitations GI:  No abdominal pain, nausea, vomiting, diarrhea, change in bowel pattern, anorexia Resp: No DOE, rest dyspnea, cough, mucus, hemoptysis, wheezing  GU: no dysuria, change in color of urine, no urgency or frequency.  No flank pain. MS:  No joint pain or swelling. No myalgias,  No decreased range of motion.  Psych:  No change in mood or affect. No memory loss. Skin: no rash or lesions.   PAST MEDICAL/SURGICAL/SOCIAL/FAMILY HISTORIES   Past  Medical History:  Diagnosis Date  . A-fib (HCC)   . GERD (gastroesophageal reflux disease)   . Hypercholesteremia   . Stroke [facility])     Past Surgical History:  Procedure Laterality Date  . TOTAL HIP ARTHROPLASTY Left     Social History   Tobacco Use  . Smoking status: Never Smoker  . Smokeless tobacco: Never Used  Substance Use Topics  . Alcohol use: No    Family History  Problem Relation Age of Onset  . Hypertension Father      No Known Allergies   Prior to Admission medications   Medication Sig Start Date End Date Taking? Authorizing Provider  apixaban (ELIQUIS) 5 MG TABS tablet Take 1 tablet (5 mg total) by mouth 2 (two) times daily. 03/02/17  Yes Love, Evlyn Kanner, PA-C  aspirin EC 81 MG tablet Take 81 mg by mouth daily.   Yes [provider]  atorvastatin (LIPITOR) 40 MG tablet Take 20 mg by mouth every evening.   Yes [provider]  docusate sodium (COLACE) 100 MG capsule Take 1 capsule (100 mg total) by mouth daily as needed for mild constipation. 03/02/17  Yes Love, Evlyn Kanner, PA-C  ferrous sulfate 325 (65 FE) MG tablet Take 1 tablet (325 mg total) by mouth daily with breakfast. 03/02/17  Yes Love, Evlyn Kanner, PA-C  finasteride (PROSCAR) 5 MG tablet Take 1 tablet (5 mg total) by mouth daily. 03/02/17  Yes Love, Evlyn Kanner, PA-C  omeprazole (PRILOSEC) 20 MG capsule Take 20 mg by mouth daily.   Yes [provider]  sertraline (ZOLOFT) 25 MG tablet Take 1 tablet (25  mg total) by mouth daily. 03/02/17  Yes Love, Evlyn Kanner, PA-C  tamsulosin (FLOMAX) 0.4 MG CAPS capsule Take 0.4 mg by mouth at bedtime.   Yes [provider]  atorvastatin (LIPITOR) 80 MG tablet Take 1 tablet (80 mg total) by mouth daily. Patient not taking: Reported on 08/17/2017 03/02/17   Love, Evlyn Kanner, PA-C  cyclobenzaprine (FLEXERIL) 5 MG tablet Take 1 tablet (5 mg total) by mouth 3 (three) times daily as needed for muscle spasms. Patient not taking: Reported on 08/17/2017 03/02/17    Jacquelynn Cree, PA-C    Current Facility-Administered Medications  Medication Dose Route Frequency Provider Last Rate Last Dose  .  stroke: mapping our early stages of recovery book   Does not apply Once Milon Dikes, MD      . acetaminophen (TYLENOL) tablet 650 mg  650 mg Oral Q4H PRN Milon Dikes, MD       Or  . acetaminophen (TYLENOL) solution 650 mg  650 mg Per Tube Q4H PRN Milon Dikes, MD       Or  . acetaminophen (TYLENOL) suppository 650 mg  650 mg Rectal Q4H PRN Milon Dikes, MD      . calcium gluconate 1 g in sodium chloride 0.9 % 100 mL IVPB  1 g Intravenous Once Beryl Meager, NP 110 mL/hr at 08/17/17 1618 1 g at 08/17/17 1618  . Chlorhexidine Gluconate Cloth 2 % PADS 6 each  6 each Topical Daily Micki Riley, MD   6 each at 08/17/17 1625  . finasteride (PROSCAR) tablet 5 mg  5 mg Oral Daily Costello, Mary A, NP   5 mg at 08/17/17 1619  . LORazepam (ATIVAN) injection 2 mg  2 mg Intravenous Q4H PRN Costello, Mary A, NP      . midazolam (VERSED) injection 2 mg  2 mg Intravenous Once Derwood Kaplan, MD   Stopped at 08/17/17 0057  . midazolam (VERSED) injection 2 mg  2 mg Intravenous Once Derwood Kaplan, MD   Stopped at 08/17/17 0058  . nicardipine (CARDENE) 20mg  in 0.86% saline IV infusion (0.1 mg/ml)  0-15 mg/hr Intravenous Continuous Milon Dikes, MD   Stopped at 08/17/17 1500  . pantoprazole (PROTONIX) injection 40 mg  40 mg Intravenous QHS Milon Dikes, MD   40 mg at 08/17/17 0232  . QUEtiapine (SEROQUEL) tablet 12.5 mg  12.5 mg Oral BID Arlice Colt A, NP   12.5 mg at 08/17/17 1248  . senna-docusate (Senokot-S) tablet 1 tablet  1 tablet Oral BID Milon Dikes, MD      . sodium chloride (hypertonic) 3 % solution   Intravenous Continuous Micki Riley, MD 85 mL/hr at 08/17/17 1045 85 mL/hr at 08/17/17 1045  . sodium chloride flush (NS) 0.9 % injection 10-40 mL  10-40 mL Intracatheter Q12H Micki Riley, MD   10 mL at 08/17/17 1535  . sodium chloride  flush (NS) 0.9 % injection 10-40 mL  10-40 mL Intracatheter PRN Micki Riley, MD      . tamsulosin (FLOMAX) capsule 0.4 mg  0.4 mg Oral QHS Costello, Mary A, NP         VITAL SIGNS: BP 125/75   Pulse 85   Temp 98.1 F (36.7 C) (Oral)   Resp 20   Ht 5\' 11"  (1.803 m)   Wt 89 kg (196 lb 3.4 oz)   SpO2 99%   BMI 27.37 kg/m   HEMODYNAMICS:    VENTILATOR SETTINGS:    INTAKE /  OUTPUT: I/O last 3 completed shifts: In: 497 [I.V.:497] Out: 850 [Urine:850]  PHYSICAL EXAMINATION: General:  Pleasant. Well-developed. Alert, awake and oriented to time, person and place. Cooperative. No acute distress. Normal affect. Head: normocephalic, atraumatic EYE: PERRLA, EOM intact, no scleral icterus, no pallor Nose: nares are patent. No polyps. No exudate. No sinus tenderness. Throat/Oral Cavity: Normal dentition. No oral thrush. No exudate. Mucous membranes are moist. No tonsillar enlargement. Neck: supple, no thyromegaly, no JVD, no lymphadenopathy. Trachea midline. Chest/Lung: symmetric in development and expansion. Good air entry. no crackles. No wheezes. Heart: Regular S1 and S2 without murmur, rub or gallop. Abdomen: soft, nontender, nondistended. Normoactive bowel sounds. n rebound. No guarding. Extremities: no pedal edema, no cyanosis, no clubbing. 2+ DP pulses Lymphatic: no cervical/axiallary/inguinal lymph nodes appreciated Musculoskeletal:  No bulk atrophy. Skin:  No rash or lesion. NEURO: cranial nerves II-XII are grossly symmetric and physiologic. R Babinski equivocal. No definite sensory deficit. No motor deficit. DTR 2+ @ RUE, 2+ @ LUE 2+ @ RLL,  2+ @ LLL. No cerebellar signs. Gait was not assessed.   LABS:  BMET Recent Labs  Lab 08/16/17 2240 08/16/17 2250 08/17/17 0351 08/17/17 0939 08/17/17 1528  NA 136 142 139 148* 143  K 3.5 3.7  --   --   --   CL 103 104  --   --   --   CO2 25  --   --   --   --   BUN 15 18  --   --   --   CREATININE 1.16 1.10  --   --   --    GLUCOSE 80 80  --   --   --     Electrolytes Recent Labs  Lab 08/16/17 2240  CALCIUM 8.8*    CBC Recent Labs  Lab 08/16/17 2240 08/16/17 2250  WBC 7.3  --   HGB 13.2 13.6  HCT 39.8 40.0  PLT 245  --     Coag's Recent Labs  Lab 08/16/17 2240  APTT 38*  INR 1.04    Sepsis Markers No results for input(s): LATICACIDVEN, PROCALCITON, O2SATVEN in the last 168 hours.  ABG No results for input(s): PHART, PCO2ART, PO2ART in the last 168 hours.  Liver Enzymes Recent Labs  Lab 08/16/17 2240  AST 23  ALT 27  ALKPHOS 74  BILITOT 0.9  ALBUMIN 3.8    Cardiac Enzymes No results for input(s): TROPONINI, PROBNP in the last 168 hours.  Glucose Recent Labs  Lab 08/16/17 2238  GLUCAP 68    Imaging Ct Head Wo Contrast  Result Date: 08/17/2017 CLINICAL DATA:  84 year old male-follow-up intracranial hemorrhage. EXAM: CT HEAD WITHOUT CONTRAST TECHNIQUE: Contiguous axial images were obtained from the base of the skull through the vertex without intravenous contrast. COMPARISON:  08/16/2017 CT and prior studies FINDINGS: Brain: There has been little interval change of right temporal intraparenchymal hematoma with maximal transverse diameter of 2.8 cm (3:15). A large amount of right intraventricular hemorrhage is again identified. A small amount of hemorrhage within the left occipital horn is now noted. A tiny parafalcine subdural hematoma measuring 1 mm is unchanged. Right lateral ventricle size has decreased. Fullness of the left lateral ventricle is again noted. Right to left midline shift now measures 1 mm, previously 9 mm. Atrophy, chronic small-vessel white matter ischemic changes and remote left frontoparietal, right MCA and left cerebellar infarcts again noted. Vascular: Atherosclerotic calcifications again noted. Skull: No acute abnormality. Sinuses/Orbits: No acute abnormality Other: None IMPRESSION:  1. Decreased right hydrocephalus and improved right to left midline shift,  now 1 mm previously 9 mm. Relatively unchanged large right temporal intraparenchymal hemorrhage and tiny parafalcine subdural hematoma. Small amount of left intraventricular hemorrhage now noted. 2. Atrophy, chronic small-vessel white matter ischemic changes and remote infarcts. Electronically Signed   By: Harmon PierJeffrey  Hu M.D.   On: 08/17/2017 11:11   Ct Head Code Stroke Wo Contrast  Result Date: 08/16/2017 CLINICAL DATA:  Code stroke.  Left-sided weakness with fixed gaze EXAM: CT HEAD WITHOUT CONTRAST TECHNIQUE: Contiguous axial images were obtained from the base of the skull through the vertex without intravenous contrast. COMPARISON:  Head CT 03/06/2017 FINDINGS: Brain: There is an intraparenchymal hematoma centered within the anterior right temporal lobe, measuring 3.5 x 2.8 x 2.1 cm (volume = 11 cm^3) (series 5, image 39, series 6, image 18). There is a large amount of intraventricular blood filling the right occipital and temporal horns. At the level of the foramina of [place], there is 5 mm of leftward midline shift. More superiorly, there is leftward bulging of the septum pellucidum that measures 9 mm from midline. There is early/impending in subfalcine herniation of the anterior cingulate gyrus. There is periventricular hypoattenuation compatible with chronic microvascular disease. Basal cisterns remain patent. Vascular: No hyperdense vessel or unexpected calcification. Skull: Normal Sinuses/Orbits: Partial opacification of the ethmoid sinuses. Right maxillary sinus atelectasis. No fluid levels. Nasopharynx is clear. No mastoid effusion. Normal orbits. Other: None IMPRESSION: 1. Intraparenchymal hematoma in the anterior right temporal lobe with volume of 11 cubic cm with associated intraventricular extension filling the right lateral ventricle temporal and occipital horns. 2. Leftward midline shift measuring 5-9 mm with early/impending subfalcine herniation of the anterior cingulate gyrus. These results were  communicated to Dr. Wilford CornerArora At 10:51 pmon 1/10/2019by telephone and he verbally acknowledged the results. Electronically Signed   By: Deatra RobinsonKevin  Herman M.D.   On: 08/16/2017 23:13    CULTURES: Results for orders placed or performed during the hospital encounter of 08/16/17  MRSA PCR Screening     Status: Abnormal   Collection Time: 08/17/17  3:39 AM  Result Value Ref Range Status   MRSA by PCR POSITIVE (A) NEGATIVE Final    Comment:        The GeneXpert MRSA Assay (FDA approved for NASAL specimens only), is one component of a comprehensive MRSA colonization surveillance program. It is not intended to diagnose MRSA infection nor to guide or monitor treatment for MRSA infections. RESULT CALLED TO, READ BACK BY AND VERIFIED WITH: B HESTER,RN AT 0924 08/17/17 BY L BENFIELD     ASSESSMENT / PLAN: Active Problems:   Hydrocephalus   Intraventricular hemorrhage (HCC)   Subdural hematoma (HCC)   Dysarthria due to acute stroke (HCC)   Bacteriuria with pyuria  Monitor neurologic status. Monitor respiratory status. The patient appears to be able to protect his airway at this time. Urine culture   Marcelle SmilingSeong-Joo Murel Shenberger, MD Board Certified by the ABIM, Pulmonary Diseases & Critical Care Medicine  [facility] Pager: [phone]  08/17/2017, 4:31 PM

## 2017-08-17 NOTE — Evaluation (Signed)
Clinical/Bedside Swallow Evaluation Patient Details  Name: [name] MRN: [id number] Date of Birth: 05-06-1934  Today's Date: 08/17/2017 Time: SLP Start Time (ACUTE ONLY): 1150 SLP Stop Time (ACUTE ONLY): 1218 SLP Time Calculation (min) (ACUTE ONLY): 28 min  Past Medical History:  Past Medical History:  Diagnosis Date  . A-fib (HCC)   . GERD (gastroesophageal reflux disease)   . Hypercholesteremia   . Stroke [facility](HCC)    Past Surgical History:  Past Surgical History:  Procedure Laterality Date  . TOTAL HIP ARTHROPLASTY Left    HPI:  [name] a 84 y.o.malepast medical history of old stroke with no residual deficit, atrial fibrillation on Eliquis last dose taken the morning of 08/16/2017, witnessed last known normal day or so ago but drove to his lady friend's house this evening around 7 PM for dinner and was not behaving like his normal self. Some point during that time he started having left-sided weakness for which EMS was called. When EMS arrived, his systolic blood pressure was in the 220s-30s. He was taken for noncontrast CT of the head that showed a right temporal ICH with IVH. Midline shift with impending subfalcine herniation of the anterior cingulate gyrus.   Assessment / Plan / Recommendation Clinical Impression  Pt demonstrates mild CN VII impairment on the left. Able to tolerate thin liquids if given with assist to control bolus size. Pt had obvious discoordination with sip sof water via cup wtih self feeding followed by strong effective cough. Pt able to self feed applesauce with assist for use of spoon. Missing dentition and mastication impaired with solids. Recommend pt initiate a puree (Dys1) diet and thin liquids. SLP will follow for potential upgrade when dentures are present.  SLP Visit Diagnosis: Dysphagia, oral phase (R13.11)    Aspiration Risk  Mild aspiration risk    Diet Recommendation Dysphagia 1 (Puree);Thin liquid   Liquid  Administration via: Cup;Straw Medication Administration: Whole meds with puree Supervision: Staff to assist with self feeding;Full supervision/cueing for compensatory strategies Compensations: Slow rate;Small sips/bites;Minimize environmental distractions Postural Changes: Seated upright at 90 degrees    Other  Recommendations     Follow up Recommendations Inpatient Rehab      Frequency and Duration min 2x/week  2 weeks       Prognosis Prognosis for Safe Diet Advancement: Good      Swallow Study   General HPI: [name] a 84 y.o.malepast medical history of old stroke with no residual deficit, atrial fibrillation on Eliquis last dose taken the morning of 08/16/2017, witnessed last known normal day or so ago but drove to his lady friend's house this evening around 7 PM for dinner and was not behaving like his normal self. Some point during that time he started having left-sided weakness for which EMS was called. When EMS arrived, his systolic blood pressure was in the 220s-30s. He was taken for noncontrast CT of the head that showed a right temporal ICH with IVH. Midline shift with impending subfalcine herniation of the anterior cingulate gyrus. Type of Study: Bedside Swallow Evaluation Previous Swallow Assessment: none - per caughter he was seen on rehab, but no dysphagia documented Diet Prior to this Study: NPO Temperature Spikes Noted: No Respiratory Status: Room air History of Recent Intubation: No Behavior/Cognition: Alert;Cooperative;Distractible Oral Cavity Assessment: Within Functional Limits Oral Care Completed by SLP: No Oral Cavity - Dentition: Missing dentition;Dentures, not available Vision: Impaired for self-feeding Self-Feeding Abilities: Needs assist Patient Positioning: Upright in bed Baseline Vocal Quality:  Normal Volitional Cough: Strong Volitional Swallow: Able to elicit    Oral/Motor/Sensory Function Overall Oral Motor/Sensory Function:  Mild impairment Facial ROM: Reduced left;Suspected CN VII (facial) dysfunction Facial Symmetry: Abnormal symmetry left;Suspected CN VII (facial) dysfunction Facial Strength: Reduced left;Suspected CN VII (facial) dysfunction Facial Sensation: Within Functional Limits Lingual ROM: Within Functional Limits Lingual Symmetry: Within Functional Limits Lingual Strength: Within Functional Limits Lingual Sensation: Within Functional Limits Velum: Within Functional Limits Mandible: Within Functional Limits   Ice Chips Ice chips: Within functional limits Presentation: Spoon   Thin Liquid Thin Liquid: Impaired Presentation: Self Fed;Cup;Straw Pharyngeal  Phase Impairments: Suspected delayed Swallow;Cough - Immediate    Nectar Thick Nectar Thick Liquid: Not tested   Honey Thick Honey Thick Liquid: Not tested   Puree Puree: Within functional limits Presentation: Self Fed;Spoon   Solid   GO   Solid: Impaired Presentation: Self Fed Oral Phase Impairments: Impaired mastication;Reduced lingual movement/coordination       Harlon Ditty, [place] CCC-SLP [phone]  Claudine Mouton 08/17/2017,3:02 PM

## 2017-08-17 NOTE — ED Provider Notes (Signed)
[facility] EMERGENCY DEPARTMENT Provider Note   CSN: [id number] Arrival date & time: 08/16/17  2236   An emergency department physician performed an initial assessment on this suspected stroke patient at 2235.  History   Chief Complaint Chief Complaint  Patient presents with  . Code Stroke    HPI [name] is a 84 y.o. male.  HPI  84 year old male comes in as a code stroke with left-sided weakness.  Level 5 caveat for altered mental status  Past medical history of old stroke with no residual deficit, atrial fibrillation on Eliquis last dose taken the morning of 08/16/2017, witnessed last known normal day or so ago but drove to his lady friend's house this evening around 7 PM for dinner and was not behaving like his normal self.  Some point during that time he started having left-sided weakness for which EMS was called.  Past Medical History:  Diagnosis Date  . A-fib (HCC)   . GERD (gastroesophageal reflux disease)   . Hypercholesteremia   . Stroke [facility])     Patient Active Problem List   Diagnosis Date Noted  . ICH (intracerebral hemorrhage) (HCC) 08/16/2017  . Left shoulder pain 02/17/2017  . 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 BPH (benign prostatic hyperplasia) 05/01/2016  . Paroxysmal atrial fibrillation (HCC) 05/01/2016  . Hyperlipidemia, unspecified 05/01/2016    Past Surgical History:  Procedure Laterality Date  . TOTAL HIP ARTHROPLASTY Left        Home Medications    Prior to Admission medications   Medication Sig Start Date End Date Taking? Authorizing Provider  apixaban (ELIQUIS) 5 MG TABS tablet Take 1 tablet (5 mg total) by mouth 2 (two) times daily. 03/02/17  Yes Love, Evlyn Kanner, PA-C  aspirin EC 81 MG tablet Take 81 mg by mouth daily.   Yes [provider]  atorvastatin (LIPITOR) 40 MG tablet Take 20 mg by mouth every evening.   Yes [provider]    docusate sodium (COLACE) 100 MG capsule Take 1 capsule (100 mg total) by mouth daily as needed for mild constipation. 03/02/17  Yes Love, Evlyn Kanner, PA-C  ferrous sulfate 325 (65 FE) MG tablet Take 1 tablet (325 mg total) by mouth daily with breakfast. 03/02/17  Yes Love, Evlyn Kanner, PA-C  finasteride (PROSCAR) 5 MG tablet Take 1 tablet (5 mg total) by mouth daily. 03/02/17  Yes Love, Evlyn Kanner, PA-C  omeprazole (PRILOSEC) 20 MG capsule Take 20 mg by mouth daily.   Yes [provider]  sertraline (ZOLOFT) 25 MG tablet Take 1 tablet (25 mg total) by mouth daily. 03/02/17  Yes Love, Evlyn Kanner, PA-C  tamsulosin (FLOMAX) 0.4 MG CAPS capsule Take 0.4 mg by mouth at bedtime.   Yes [provider]  atorvastatin (LIPITOR) 80 MG tablet Take 1 tablet (80 mg total) by mouth daily. Patient not taking: Reported on 08/17/2017 03/02/17   Love, Evlyn Kanner, PA-C  cyclobenzaprine (FLEXERIL) 5 MG tablet Take 1 tablet (5 mg total) by mouth 3 (three) times daily as needed for muscle spasms. Patient not taking: Reported on 08/17/2017 03/02/17   Love, Evlyn Kanner, PA-C    Family History Family History  Problem Relation Age of Onset  . Hypertension Father     Social History Social History   Tobacco Use  . Smoking status: Never Smoker  . Smokeless tobacco: Never Used  Substance Use Topics  . Alcohol use: No  . Drug use: No  Allergies   Patient has no known allergies.   Review of Systems Review of Systems  Unable to perform ROS: Mental status change     Physical Exam Updated Vital Signs BP 109/79   Pulse (!) 102   Temp 97.6 F (36.4 C) (Oral)   Resp (!) 22   Ht 5\' 11"  (1.803 m)   Wt 89 kg (196 lb 3.4 oz)   SpO2 96%   BMI 27.37 kg/m   Physical Exam  Constitutional: He appears well-developed.  HENT:  Head: Atraumatic.  Eyes: EOM are normal.  Cardiovascular: Normal rate.  Pulmonary/Chest: Effort normal.  Neurological: He is alert.  Left-sided hemineglect  Nursing note and vitals  reviewed.    ED Treatments / Results  Labs (all labs ordered are listed, but only abnormal results are displayed) Labs Reviewed  APTT - Abnormal; Notable for the following components:      Result Value   aPTT 38 (*)    All other components within normal limits  DIFFERENTIAL - Abnormal; Notable for the following components:   Monocytes Absolute 1.1 (*)    All other components within normal limits  COMPREHENSIVE METABOLIC PANEL - Abnormal; Notable for the following components:   Calcium 8.8 (*)    GFR calc non Af Amer 57 (*)    All other components within normal limits  I-STAT CHEM 8, ED - Abnormal; Notable for the following components:   Calcium, Ion 1.12 (*)    All other components within normal limits  PROTIME-INR  CBC  I-STAT TROPONIN, ED  CBG MONITORING, ED    EKG  EKG Interpretation  Date/Time:  Thursday August 16 2017 22:57:41 EST Ventricular Rate:  60 PR Interval:    QRS Duration: 114 QT Interval:  455 QTC Calculation: 455 R Axis:   -64 Text Interpretation:  Sinus rhythm Left anterior fascicular block Probable anteroseptal infarct, old Minimal ST elevation, lateral leads No acute changes Confirmed by Derwood Kaplan [phone]) on 08/17/2017 1:17:12 AM       Radiology Ct Head Code Stroke Wo Contrast  Result Date: 08/16/2017 CLINICAL DATA:  Code stroke.  Left-sided weakness with fixed gaze EXAM: CT HEAD WITHOUT CONTRAST TECHNIQUE: Contiguous axial images were obtained from the base of the skull through the vertex without intravenous contrast. COMPARISON:  Head CT 03/06/2017 FINDINGS: Brain: There is an intraparenchymal hematoma centered within the anterior right temporal lobe, measuring 3.5 x 2.8 x 2.1 cm (volume = 11 cm^3) (series 5, image 39, series 6, image 18). There is a large amount of intraventricular blood filling the right occipital and temporal horns. At the level of the foramina of [place], there is 5 mm of leftward midline shift. More superiorly, there is  leftward bulging of the septum pellucidum that measures 9 mm from midline. There is early/impending in subfalcine herniation of the anterior cingulate gyrus. There is periventricular hypoattenuation compatible with chronic microvascular disease. Basal cisterns remain patent. Vascular: No hyperdense vessel or unexpected calcification. Skull: Normal Sinuses/Orbits: Partial opacification of the ethmoid sinuses. Right maxillary sinus atelectasis. No fluid levels. Nasopharynx is clear. No mastoid effusion. Normal orbits. Other: None IMPRESSION: 1. Intraparenchymal hematoma in the anterior right temporal lobe with volume of 11 cubic cm with associated intraventricular extension filling the right lateral ventricle temporal and occipital horns. 2. Leftward midline shift measuring 5-9 mm with early/impending subfalcine herniation of the anterior cingulate gyrus. These results were communicated to Dr. Wilford Corner At 10:51 pmon 1/10/2019by telephone and he verbally acknowledged the results. Electronically Signed  By: Deatra RobinsonKevin  Herman M.D.   On: 08/16/2017 23:13    Procedures Procedures (including critical care time)  CENTRAL LINE Performed by: Corbitt Cloke Consent: The procedure was performed in an emergent situation. Required items: required blood products, implants, devices, and special equipment available Patient identity confirmed: arm band and provided demographic data Time out: Immediately prior to procedure a "time out" was called to verify the correct patient, procedure, equipment, support staff and site/side marked as required. Indications: vascular access Anesthesia: local infiltration Local anesthetic: lidocaine 1% with epinephrine Anesthetic total: 3 ml Patient sedated: no Preparation: skin prepped with 2% chlorhexidine Skin prep agent dried: skin prep agent completely dried prior to procedure Sterile barriers: all five maximum sterile barriers used - cap, mask, sterile gown, sterile gloves, and large  sterile sheet Hand hygiene: hand hygiene performed prior to central venous catheter insertion  Location details: left femoral central line  Catheter type: triple lumen Catheter size: 8 Fr Pre-procedure: landmarks identified Ultrasound guidance: no Successful placement: yes Post-procedure: line sutured and dressing applied Assessment: blood return through all parts, free fluid flow, placement verified by x-ray and no pneumothorax on x-ray Patient tolerance: Patient tolerated the procedure well with no immediate complications.   CRITICAL CARE Performed by: Audria Takeshita   Total critical care time: 40 minutes  Critical care time was exclusive of separately billable procedures and treating other patients.  Critical care was necessary to treat or prevent imminent or life-threatening deterioration.  Critical care was time spent personally by me on the following activities: development of treatment plan with patient and/or surrogate as well as nursing, discussions with consultants, evaluation of patient's response to treatment, examination of patient, obtaining history from patient or surrogate, ordering and performing treatments and interventions, ordering and review of laboratory studies, ordering and review of radiographic studies, pulse oximetry and re-evaluation of patient's condition.   Medications Ordered in ED Medications  niCARdipine in saline (CARDENE-IV) 20-0.86 MG/200ML-% infusion SOLN (not administered)   stroke: mapping our early stages of recovery book (not administered)  acetaminophen (TYLENOL) tablet 650 mg (not administered)    Or  acetaminophen (TYLENOL) solution 650 mg (not administered)    Or  acetaminophen (TYLENOL) suppository 650 mg (not administered)  senna-docusate (Senokot-S) tablet 1 tablet (not administered)  pantoprazole (PROTONIX) injection 40 mg (not administered)  labetalol (NORMODYNE,TRANDATE) injection 20 mg (0 mg Intravenous Hold 08/16/17 2327)     And  nicardipine (CARDENE) 20mg  in 0.86% saline 200ml IV infusion (0.1 mg/ml) (5 mg/hr Intravenous Rate/Dose Change 08/16/17 2357)  midazolam (VERSED) injection 2 mg (0 mg Intravenous Hold 08/17/17 0057)  midazolam (VERSED) injection 2 mg (0 mg Intravenous Hold 08/17/17 0058)  prothrombin complex conc human (KCENTRA) IVPB 4,336 Units (0 Units Intravenous Stopped 08/16/17 2357)     Initial Impression / Assessment and Plan / ED Course  I have reviewed the triage vital signs and the nursing notes.  Pertinent labs & imaging results that were available during my care of the patient were reviewed by me and considered in my medical decision making (see chart for details).    84 year old male comes in with chief complaint of altered mental status and left-sided hemi-neglect and hemiparesis. CT scan shows brain bleed.  Patient is having acute hemorrhagic stroke.  Patient is on Eliquis, Kcentra ordered for reversal.  Patient's blood pressure initially is elevated and we started pt on nicardipine.  At the request of neurology, we put in a central line.  Final Clinical Impressions(s) / ED Diagnoses  Final diagnoses:  Acute ischemic stroke [facility])  Intracranial hemorrhage [facility])    ED Discharge Orders    None       Derwood Kaplan, MD 08/17/17 [phone]

## 2017-08-17 NOTE — Progress Notes (Signed)
NEUROHOSPITALISTS STROKE TEAM - DAILY PROGRESS NOTE   ADMISSION HISTORY: Gregory Gregory is a 84 y.o. male past medical history of old stroke with no residual deficit, atrial fibrillation on Eliquis last dose taken the morning of 08/16/2017, witnessed last known normal day or so ago but drove to his lady friend's house this evening around 7 PM for dinner and was not behaving like his normal self.  Some point during that time he started having left-sided weakness for which EMS was called.  When EMS arrived, his systolic blood pressure was in the 220s-30s.  His fingerstick glucose was within normal range. He was brought in as an acute code stroke to [facility] emergency room at [facility]  by [facility].  He had also complained of headache at that time. Upon initial evaluation, he had a fixed rightward gaze, left upper extremity plegia, left lower extremity paresis, dense sensory loss and neglect of the left side. He was taken for noncontrast CT of the head that showed a right temporal ICH with IVH.  LKW: At best 7 PM on 08/16/2017, could have been a day or so before that-history unclear tpa given?: no, ICH Premorbid modified Rankin scale (mRS): 0  ICH score 2-1 for intraventricular, 1 for age over 6780 NIHSS 1a Level of Conscious.:  0 1b LOC Questions: 0 1c LOC Commands: 0 2 Best Gaze: 2 3 Visual: 2 4 Facial Palsy: 2 5a Motor Arm - left: 4 5b Motor Arm - Right: 0 6a Motor Leg - Left: 2 6b Motor Leg - Right: 0 7 Limb Ataxia: 0 8 Sensory: 2 9 Best Language: 0 10 Dysarthria: 2 11 Extinct. and Inatten.:  2 TOTAL: 18  SUBJECTIVE (INTERVAL HISTORY)  Family is at the bedside. Patient is found laying in bed in NAD. Voices no new complaints. No new events reported overnight. Patient appears to be hallucinating on exam. Family says he has a history of sundowning with hospitalizations.   OBJECTIVE Lab Results: CBC:  Recent Labs    Lab 08/16/17 2240 08/16/17 2250  WBC 7.3  --   HGB 13.2 13.6  HCT 39.8 40.0  MCV 92.3  --   PLT 245  --    BMP: Recent Labs  Lab 08/16/17 2240 08/16/17 2250 08/17/17 0351 08/17/17 0939  NA 136 142 139 148*  K 3.5 3.7  --   --   CL 103 104  --   --   CO2 25  --   --   --   GLUCOSE 80 80  --   --   BUN 15 18  --   --   CREATININE 1.16 1.10  --   --   CALCIUM 8.8*  --   --   --    Liver Function Tests:  Recent Labs  Lab 08/16/17 2240  AST 23  ALT 27  ALKPHOS 74  BILITOT 0.9  PROT 6.7  ALBUMIN 3.8   Recent Labs    08/16/17 2240  APTT 38*  INR 1.04   Urine Drug Screen:     Component Value Date/Time   LABOPIA NONE DETECTED 02/13/2017 0908   COCAINSCRNUR NONE DETECTED 02/13/2017 0908   LABBENZ NONE DETECTED 02/13/2017 0908   AMPHETMU NONE DETECTED 02/13/2017 0908   THCU NONE DETECTED 02/13/2017 0908   LABBARB NONE DETECTED 02/13/2017 0908    PHYSICAL EXAM Temp:  [97.6 F (36.4 C)-98.1 F (36.7 C)] 98.1 F (36.7 C) (01/11 0800) Pulse Rate:  [57-119] 72 (01/11 1300) Resp:  [  17-30] 17 (01/11 1300) BP: (94-188)/(47-112) 133/73 (01/11 1300) SpO2:  [95 %-100 %] 97 % (01/11 1300) Weight:  [89 kg (196 lb 3.4 oz)] 89 kg (196 lb 3.4 oz) (01/10 2258) General - Well nourished, well developed elderly Caucasian male, in no apparent distress HEENT-  Normocephalic, Normal external eye/conjunctiva.  Normal external ears. Normal external nose, mucus membranes and septum.   Cardiovascular - Regular rate and rhythm  Respiratory - Lungs clear bilaterally. No wheezing. Abdomen - soft and non-tender, BS normal Extremities- no edema or cyanosis Neurological Exam : Mental status: Awake alert, Not oriented, except to family and name Speech is severely dysarthric.  Naming comprehension and repetition is intact. Cranial nerves: Pupils equal round reactive to light, post right gaze deviation, no blink to threat from the left, intact blink from the right, right lower facial  weakness. Motor exam: 0/5 left upper extremity, 2/5 left lower extremity, 5/5 right upper and lower extremities. Normal tone normal range of motion in all 4 extremities Sensory exam: Dense sensory loss on the left hemibody.. Neglecting the left side.  Unable to recognize his own hand on the left Intact finger-nose-finger on the right.  Unable to perform on the left. Gait exam was deferred due to his hemiplegia  IMAGING: I have personally reviewed the radiological images below and agree with the radiology interpretations. Ct Head Wo Contrast Result Date: 08/17/2017 IMPRESSION: 1. Decreased right hydrocephalus and improved right to left midline shift, now 1 mm previously 9 mm. Relatively unchanged large right temporal intraparenchymal hemorrhage and tiny parafalcine subdural hematoma. Small amount of left intraventricular hemorrhage now noted. 2. Atrophy, chronic small-vessel white matter ischemic changes and remote infarcts.   Ct Head Code Stroke Wo Contrast Result Date: 08/16/2017 IMPRESSION: 1. Intraparenchymal hematoma in the anterior right temporal lobe with volume of 11 cubic cm with associated intraventricular extension filling the right lateral ventricle temporal and occipital horns. 2. Leftward midline shift measuring 5-9 mm with early/impending subfalcine herniation of the anterior cingulate gyrus.   Echocardiogram:                                               Study Conclusions - Left ventricle: The cavity size was normal. Systolic function was   normal. The estimated ejection fraction was in the range of 55%   to 60%. - Aortic valve: There was trivial regurgitation. - Mitral valve: Calcified annulus. Mildly thickened leaflets .   There was mild regurgitation.  MRI Brain:                                                                   PENDING    IMPRESSION: Gregory Gregory is a 84 y.o. male with PMH of atrial fibrillation on Eliquis presented for evaluation of acute  onset of left-sided weakness, left facial droop, slurred speech and rightward gaze deviation. Examination revealed an NIH stroke scale of 18. CT scan shows right temporal lobe ICH with IVH.   Large Right temporal intraparenchymal hemorrhage and tiny parafalcine subdural hematoma.  Suspected Etiology: Likely HTN hemorrhage  in the setting of underlying coagulopathy by Eliquis. Resultant Symptoms: fixed  rightward gaze, left upper extremity plegia, left lower extremity paresis, dense sensory loss and neglect of the left side. Stroke Risk Factors: atrial fibrillation, hyperlipidemia and hypertension Other Stroke Risk Factors: Advanced age, Hx stroke  Outstanding Stroke Work-up Studies:     MRI brain  08/17/2017 ASSESSMENT:   Neuro exam stable with left sided weakness. Patient hallucinating on exam. Likely undiagnosed dementia at baseline. MRI pending. CCM consulted for medical issues. 3% in progress with femoral line. PICC line ordered so that patient may participate in therapies once medically stable.   PLAN  08/17/2017: HOLD ASA until 24 hour post tPA neuroimaging is stable & without evidence of bleeding Will add statin in necessary Frequent neuro checks Telemetry monitoring PT/OT/SLP Consult PM & Rehab Consult Case Management /MSW Ongoing aggressive stroke risk factor management Patient's family counseled to be compliant with his antithrombotic medications Patient's family counseled on Lifestyle modifications including, Diet, Exercise, and Stress Follow up with [facility] Stroke [name] in 6 weeks  HX OF STROKES: remote left frontoparietal, right MCA and left cerebellar infarcts  DYSPHAGIA: Passed SLP swallow evaluation Aspiration Precautions in progress  CEREBRAL EDEMA: 3% NS Infusion per order set. Sodium goal 150-155. Na Levels every 6 hrs No need for Neurosurgery Consultation at this time High risk for respiratory compromise & progression of cerebral edema next 2-3 days.  Peak swelling from strokes is usually 3-5 days. Considerstatneurosurgical consultation for hemicraniectomy for mentation worsens or if there is any imaging evidence of herniation  AFIB, CHRONIC: Hx of atrial fibrillation on Eliquis last dose taken the morning of 08/16/2017 reversal of eliquis with K-centra per pharmacy protocol and d/c Eliquis Rate control -will appreciate PCCM consult for medical management assistance while in ICU  MEDICAL ISSUES: Hypertensive Encephalopathy B/P's stable overnight Will continue to monitor closely  AKI IVF's in progress Repeat labs in AM  Anemia on Admission Stable this AM Repeat labs in AM Will continue to monitor  Hypo Calcium Replacement in progress Repeat Labs in AM  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 Home Meds: NONE  HYPERLIPIDEMIA: No results found for: CHOL, TRIG, HDL, CHOLHDL, VLDL, LDLCALC-  PENDING Home Meds:  NONE LDL  goal < 70 Will start on statin if needed Continue statin at discharge, if needed  DIABETES: No results found for: HGBA1C - PENDING Recent Labs  Lab 08/16/17 2238  GLUCAP 68  HgbA1c goal < 7.0  Other Active Problems: Active Problems:   ICH (intracerebral hemorrhage) [facility])    Hospital day # 1 VTE prophylaxis:SCD's  Diet : DIET - DYS 1 Room service appropriate? Yes; Fluid consistency: Thin   FAMILY UPDATES: No family at bedside  TEAM UPDATES: Micki Riley, MD STATUS:  DNR     Prior Home Stroke Medications:  aspirin 81 mg daily and Eliquis (apixaban) daily  Discharge Stroke Meds:  Please discharge patient on TBD   Disposition: 01-Home or Self  Care Therapy Recs:               PENDING Home Equipment:         PENDING Follow Up:  Follow-up Information    Micki Riley, MD. Schedule an appointment as soon as possible for  a visit in 6 week(s).   Specialties:  Neurology, Radiology Contact information: [address] [place] [address] [phone]          [facility] Follow up in 1-2 weeks      Assessment & plan discussed with with attending physician and they are in agreement.    Beryl Meager, ANP-C Stroke Neurology Team 08/17/2017 3:02 PM I have personally examined this patient, reviewed notes, independently viewed imaging studies, participated in medical decision making and plan of care.ROS completed by me personally and pertinent positives fully documented  I have made any additions or clarifications directly to the above note. Agree with note above.  He has presented with a right-sided intracerebral hemorrhage secondary to hypertension and eliquis related coagulopathy. Remains at risk for neurological worsening and development of hydrocephalus. Continue close neurological monitoring and tight blood pressure control. Long discussion with patient and family at the bedside about prognosis and answered questions. We'll consult critical care team to help manage medical issues. Discussed with Dr. Alfredo Bach This patient is critically ill and at significant risk of neurological worsening, death and care requires constant monitoring of vital signs, hemodynamics,respiratory and cardiac monitoring, extensive review of multiple databases, frequent neurological assessment, discussion with family, other specialists and medical decision making of high complexity.I have made any additions or clarifications directly to the above note.This critical care time does not reflect procedure time, or teaching time or supervisory time of PA/NP/Med Resident etc but could involve care discussion time.  I spent 50 minutes of  neurocritical care time  in the care of  this patient.     Delia Heady, MD Medical Director [facility] Pager: [phone] 08/17/2017 4:49 PM  To contact Stroke Continuity provider, please refer to [email or web address]. After hours, contact General Neurology

## 2017-08-17 NOTE — Evaluation (Signed)
Speech Language Pathology Evaluation Patient Details Name: [name] MRN: [id number] DOB: 1933-10-14 Today's Date: 08/17/2017 Time: 4098-1191 SLP Time Calculation (min) (ACUTE ONLY): 28 min  Problem List:  Patient Active Problem List   Diagnosis Date Noted  . ICH (intracerebral hemorrhage) (HCC) 08/16/2017  . Left shoulder pain 02/17/2017  . 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 BPH (benign prostatic hyperplasia) 05/01/2016  . Paroxysmal atrial fibrillation (HCC) 05/01/2016  . Hyperlipidemia, unspecified 05/01/2016   Past Medical History:  Past Medical History:  Diagnosis Date  . A-fib (HCC)   . GERD (gastroesophageal reflux disease)   . Hypercholesteremia   . Stroke [facility])    Past Surgical History:  Past Surgical History:  Procedure Laterality Date  . TOTAL HIP ARTHROPLASTY Left    HPI:  Gregory Belter Matherlyis a 84 y.o.malepast medical history of old stroke with no residual deficit, atrial fibrillation on Eliquis last dose taken the morning of 08/16/2017, witnessed last known normal day or so ago but drove to his lady friend's house this evening around 7 PM for dinner and was not behaving like his normal self. Some point during that time he started having left-sided weakness for which EMS was called. When EMS arrived, his systolic blood pressure was in the 220s-30s. He was taken for noncontrast CT of the head that showed a right temporal ICH with IVH. Midline shift with impending subfalcine herniation of the anterior cingulate gyrus.   Assessment / Plan / Recommendation Clinical Impression  Pt demosntrates cognitive impairment associated with right hemisphere impairment with decreased attention to left and gaze deviation. Pt is easily distractible and restless, poor short term memory, confabulating. Pt responds well to verbal and tactile cues for attention to basic functional tasks. provided recommendations to address  safety and cognition with family. Will follow up to facilitate functional communication.     SLP Assessment  SLP Recommendation/Assessment: Patient needs continued Speech Lanaguage Pathology Services SLP Visit Diagnosis: Attention and concentration deficit Attention and concentration deficit following: Nontraumatic intracerebral hemorrhage    Follow Up Recommendations  Inpatient Rehab    Frequency and Duration min 2x/week  2 weeks      SLP Evaluation Cognition  Overall Cognitive Status: Impaired/Different from baseline Arousal/Alertness: Awake/alert Orientation Level: Oriented to person;Disoriented to place;Disoriented to time;Disoriented to situation Attention: Focused;Sustained;Selective Focused Attention: Appears intact Sustained Attention: Appears intact Selective Attention: Impaired Selective Attention Impairment: Verbal basic;Functional basic Memory: Impaired Memory Impairment: Storage deficit;Retrieval deficit Awareness: Impaired Awareness Impairment: Intellectual impairment;Emergent impairment;Anticipatory impairment Problem Solving: Impaired Problem Solving Impairment: Verbal basic;Functional basic       Comprehension  Auditory Comprehension Overall Auditory Comprehension: Appears within functional limits for tasks assessed    Expression Verbal Expression Overall Verbal Expression: Appears within functional limits for tasks assessed   Oral / Motor  Oral Motor/Sensory Function Overall Oral Motor/Sensory Function: Mild impairment Facial ROM: Reduced left;Suspected CN VII (facial) dysfunction Facial Symmetry: Abnormal symmetry left;Suspected CN VII (facial) dysfunction Facial Strength: Reduced left;Suspected CN VII (facial) dysfunction Facial Sensation: Within Functional Limits Lingual ROM: Within Functional Limits Lingual Symmetry: Within Functional Limits Lingual Strength: Within Functional Limits Lingual Sensation: Within Functional Limits Velum: Within  Functional Limits Mandible: Within Functional Limits Motor Speech Overall Motor Speech: Impaired Respiration: Within functional limits Phonation: Normal Articulation: Impaired Level of Impairment: Word Intelligibility: Intelligible Motor Planning: Witnin functional limits Motor Speech Errors: Aware Interfering Components: Inadequate dentition   GO  Gregory DittyBonnie Doyce Stonehouse, MA CCC-SLP [phone]  [name], [name] 08/17/2017, 3:07 PM

## 2017-08-17 NOTE — Progress Notes (Signed)
[name] is a 84 yo male coming [place] home [place] [facility]. Pt last physically seen normal two days ago, girlfriend spoke to him on the phone at 1630. 1900 he drove to his girlfriends house getting lost several times. On arrival to her house she noted he wasn't acting himself and shortly after dinner he developed left side weakness. CBG 68, NIH 19

## 2017-08-17 NOTE — Progress Notes (Signed)
Corrie DandyMary, NP w/ Stroke aware sodium 143. Will call back regarding rate of 3%.

## 2017-08-17 NOTE — Progress Notes (Signed)
OT Cancellation Note  Patient Details Name: Mickie BailGeorge Thomas Grullon MRN: [id number] DOB: Nov 14, 1933   Cancelled Treatment:    Reason Eval/Treat Not Completed: Patient not medically ready(femoral line). Agitated. Will attempt later if able.   [facility],HILLARY  Vic Esco, OT/L  [phone] 08/17/2017 08/17/2017, 11:51 AM

## 2017-08-18 ENCOUNTER — Inpatient Hospital Stay (HOSPITAL_COMMUNITY): Payer: Medicare Other

## 2017-08-18 DIAGNOSIS — I611 Nontraumatic intracerebral hemorrhage in hemisphere, cortical: Principal | ICD-10-CM

## 2017-08-18 DIAGNOSIS — E876 Hypokalemia: Secondary | ICD-10-CM

## 2017-08-18 DIAGNOSIS — R471 Dysarthria and anarthria: Secondary | ICD-10-CM

## 2017-08-18 DIAGNOSIS — I615 Nontraumatic intracerebral hemorrhage, intraventricular: Secondary | ICD-10-CM

## 2017-08-18 DIAGNOSIS — I639 Cerebral infarction, unspecified: Secondary | ICD-10-CM

## 2017-08-18 DIAGNOSIS — G936 Cerebral edema: Secondary | ICD-10-CM

## 2017-08-18 DIAGNOSIS — N390 Urinary tract infection, site not specified: Secondary | ICD-10-CM

## 2017-08-18 DIAGNOSIS — R1310 Dysphagia, unspecified: Secondary | ICD-10-CM

## 2017-08-18 LAB — BASIC METABOLIC PANEL
ANION GAP: 7 (ref 5–15)
ANION GAP: 8 (ref 5–15)
BUN: 9 mg/dL (ref 6–20)
BUN: 9 mg/dL (ref 6–20)
CALCIUM: 8.9 mg/dL (ref 8.9–10.3)
CO2: 22 mmol/L (ref 22–32)
CO2: 23 mmol/L (ref 22–32)
Calcium: 8.5 mg/dL — ABNORMAL LOW (ref 8.9–10.3)
Chloride: 116 mmol/L — ABNORMAL HIGH (ref 101–111)
Chloride: 115 mmol/L — ABNORMAL HIGH (ref 101–111)
Creatinine, Ser: 0.79 mg/dL (ref 0.61–1.24)
Creatinine, Ser: 0.81 mg/dL (ref 0.61–1.24)
GFR calc Af Amer: >60 mL/min (ref >60)
GFR calc non Af Amer: >60 mL/min (ref >60)
GLUCOSE: 110 mg/dL — AB (ref 65–99)
Glucose, Bld: 126 mg/dL — ABNORMAL HIGH (ref 65–99)
POTASSIUM: 3 mmol/L — AB (ref 3.5–5.1)
POTASSIUM: 3.2 mmol/L — AB (ref 3.5–5.1)
Sodium: 145 mmol/L (ref 135–145)
Sodium: 146 mmol/L — ABNORMAL HIGH (ref 135–145)

## 2017-08-18 LAB — SODIUM
Sodium: 146 mmol/L — ABNORMAL HIGH (ref 135–145)
Sodium: 147 mmol/L — ABNORMAL HIGH (ref 135–145)

## 2017-08-18 LAB — CBC
HCT: 40.3 % (ref 39.0–52.0)
Hemoglobin: 13.4 g/dL (ref 13.0–17.0)
MCH: 30.7 pg (ref 26.0–34.0)
MCHC: 33.3 g/dL (ref 30.0–36.0)
MCV: 92.2 fL (ref 78.0–100.0)
PLATELETS: 231 10*3/uL (ref 150–400)
RBC: 4.37 MIL/uL (ref 4.22–5.81)
RDW: 13.8 % (ref 11.5–15.5)
WBC: 9.7 10*3/uL (ref 4.0–10.5)

## 2017-08-18 LAB — VITAMIN B12: VITAMIN B 12: 246 pg/mL (ref 180–914)

## 2017-08-18 LAB — LIPID PANEL
Cholesterol: 197 mg/dL (ref 0–200)
HDL: 61 mg/dL (ref >40)
LDL CALC: 110 mg/dL — AB (ref 0–99)
Total CHOL/HDL Ratio: 3.2 RATIO
Triglycerides: 132 mg/dL (ref <150)
VLDL: 26 mg/dL (ref 0–40)

## 2017-08-18 LAB — CREATININE, URINE, RANDOM: CREATININE, URINE: 42.8 mg/dL

## 2017-08-18 LAB — SODIUM, URINE, RANDOM: SODIUM UR: 213 mmol/L

## 2017-08-18 LAB — OSMOLALITY, URINE: Osmolality, Ur: 603 mOsm/kg (ref 300–900)

## 2017-08-18 LAB — MAGNESIUM
MAGNESIUM: 2.2 mg/dL (ref 1.7–2.4)
Magnesium: 1.9 mg/dL (ref 1.7–2.4)
Magnesium: 2 mg/dL (ref 1.7–2.4)

## 2017-08-18 LAB — GLUCOSE, CAPILLARY
Glucose-Capillary: 103 mg/dL — ABNORMAL HIGH (ref 65–99)
Glucose-Capillary: 141 mg/dL — ABNORMAL HIGH (ref 65–99)
Glucose-Capillary: 119 mg/dL — ABNORMAL HIGH (ref 65–99)

## 2017-08-18 LAB — PHOSPHORUS
PHOSPHORUS: 2.5 mg/dL (ref 2.5–4.6)
PHOSPHORUS: 2.6 mg/dL (ref 2.5–4.6)
Phosphorus: 2.4 mg/dL — ABNORMAL LOW (ref 2.5–4.6)

## 2017-08-18 LAB — TSH: TSH: 2.844 u[IU]/mL (ref 0.350–4.500)

## 2017-08-18 MED ORDER — CLEVIDIPINE BUTYRATE 0.5 MG/ML IV EMUL
0.0000 mg/h | INTRAVENOUS | Status: DC
  Administered 2017-08-18: 1 mg/h via INTRAVENOUS
  Administered 2017-08-18: 5 mg/h via INTRAVENOUS
  Administered 2017-08-19: 7 mg/h via INTRAVENOUS
  Administered 2017-08-19: 3 mg/h via INTRAVENOUS
  Administered 2017-08-20 (×3): 4 mg/h via INTRAVENOUS
  Administered 2017-08-21: 2 mg/h via INTRAVENOUS
  Filled 2017-08-18 (×11): qty 50

## 2017-08-18 MED ORDER — AMANTADINE HCL 50 MG/5ML PO SYRP
100.0000 mg | ORAL_SOLUTION | Freq: Two times a day (BID) | ORAL | Status: DC
  Administered 2017-08-18 – 2017-08-19 (×3): 100 mg via ORAL
  Filled 2017-08-18 (×3): qty 10

## 2017-08-18 MED ORDER — DEXTROSE 5 % IV SOLN
1.0000 g | INTRAVENOUS | Status: DC
  Administered 2017-08-18 – 2017-08-23 (×6): 1 g via INTRAVENOUS
  Filled 2017-08-18 (×7): qty 10

## 2017-08-18 MED ORDER — VITAL HIGH PROTEIN PO LIQD: 1000.0000 mL | ORAL | Status: DC

## 2017-08-18 MED ORDER — ORAL CARE MOUTH RINSE
15.0000 mL | Freq: Two times a day (BID) | OROMUCOSAL | Status: DC
  Administered 2017-08-18 – 2017-08-22 (×9): 15 mL via OROMUCOSAL

## 2017-08-18 MED ORDER — POTASSIUM CHLORIDE 20 MEQ/15ML (10%) PO SOLN
40.0000 meq | Freq: Once | ORAL | Status: AC
  Administered 2017-08-18: 40 meq
  Filled 2017-08-18: qty 30

## 2017-08-18 MED ORDER — PRO-STAT SUGAR FREE PO LIQD
30.0000 mL | Freq: Every day | ORAL | Status: DC
  Administered 2017-08-18 – 2017-08-25 (×7): 30 mL
  Filled 2017-08-18 (×8): qty 30

## 2017-08-18 MED ORDER — VITAL 1.5 CAL PO LIQD
1000.0000 mL | ORAL | Status: DC
  Administered 2017-08-18 – 2017-08-20 (×3): 1000 mL
  Filled 2017-08-18 (×9): qty 1000

## 2017-08-18 MED ORDER — PRO-STAT SUGAR FREE PO LIQD: 30.0000 mL | Freq: Two times a day (BID) | ORAL | Status: DC

## 2017-08-18 NOTE — Progress Notes (Signed)
Repeat CTH obtained due to depressed level of arousal. No major changes in CTH. UA done this evening concerning for possible UTI. RECS: Get urine  c/s and start Ceftriaxone for now. Stroke team to follow in AM  -- Milon DikesAshish Jerie Basford, MD [facility] Pager: [phone] If 7pm to 7am, please call on call as listed on AMION.

## 2017-08-18 NOTE — Progress Notes (Addendum)
NEUROHOSPITALISTS STROKE TEAM - DAILY PROGRESS NOTE   SUBJECTIVE (INTERVAL HISTORY) No family is at the bedside. Patient has a history of sundowning with hospitalizations. This am, not open eyes, agitated with eyes closed, not following commands. He got ativan 4am today. Moving all extremities but LLE seems weaker than right. Not able to have po meds, will need NG tube. CT repeat stable, will gradually taper off 3% saline. Add amantadine during the day for arousal and keep seroquel at night. Ativan PRN.     OBJECTIVE Lab Results: CBC:  Recent Labs  Lab 08/16/17 2240 08/16/17 2250 08/18/17 0638  WBC 7.3  --  9.7  HGB 13.2 13.6 13.4  HCT 39.8 40.0 40.3  MCV 92.3  --  92.2  PLT 245  --  231   BMP: Recent Labs  Lab 08/16/17 2240 08/16/17 2250  08/17/17 0939 08/17/17 1528 08/17/17 2105 08/18/17 0528 08/18/17 0638  NA 136 142   < > 148* 143 167* 145 146*  K 3.5 3.7  --   --   --   --  3.0* 3.2*  CL 103 104  --   --   --   --  116* 115*  CO2 25  --   --   --   --   --  22 23  GLUCOSE 80 80  --   --   --   --  110* 126*  BUN 15 18  --   --   --   --  9 9  CREATININE 1.16 1.10  --   --   --   --  0.79 0.81  CALCIUM 8.8*  --   --   --   --   --  8.5* 8.9  MG  --   --   --   --   --   --  1.9 2.0  PHOS  --   --   --   --   --   --  2.4* 2.5   < > = values in this interval not displayed.   Liver Function Tests:  Recent Labs  Lab 08/16/17 2240  AST 23  ALT 27  ALKPHOS 74  BILITOT 0.9  PROT 6.7  ALBUMIN 3.8   Recent Labs    08/16/17 2240  APTT 38*  INR 1.04   Urine Drug Screen:     Component Value Date/Time   LABOPIA NONE DETECTED 02/13/2017 0908   COCAINSCRNUR NONE DETECTED 02/13/2017 0908   LABBENZ NONE DETECTED 02/13/2017 0908   AMPHETMU NONE DETECTED 02/13/2017 0908   THCU NONE DETECTED 02/13/2017 0908   LABBARB NONE DETECTED 02/13/2017 0908    PHYSICAL EXAM Temp:  [98 F (36.7 C)-98.6 F (37 C)] 98.6  F (37 C) (01/12 0326) Pulse Rate:  [60-105] 74 (01/12 0700) Resp:  [17-36] 28 (01/12 0700) BP: (106-158)/(58-121) 128/58 (01/12 0700) SpO2:  [94 %-100 %] 95 % (01/12 0700) General - Well nourished, well developed elderly Caucasian male, in no apparent distress HEENT-  Normocephalic, Normal external eye/conjunctiva.  Normal external ears. Normal external nose, mucus membranes and septum.   Cardiovascular - Regular rate and rhythm  Respiratory - Lungs clear bilaterally. No wheezing. Abdomen - soft and non-tender, BS normal Extremities- no edema or cyanosis Neurological Exam : Mental status: eyes closed, not open to voice or pain, not answering orientation questions. Speech is severely dysarthric.  Cranial nerves: Pupils equal round reactive to light but against eye opening with me. No significant facial  drop on exam. Motor exam: b/l upper extremity moving equally, 3/5 left lower extremity, 4/5 right lower extremities. Normal tone normal range of motion in all 4 extremities Sensory and coordination exam: not cooperative Gait exam was deferred  IMAGING: I have personally reviewed the radiological images below and agree with the radiology interpretations. Ct Head Wo Contrast Result Date: 08/17/2017 IMPRESSION: 1. Decreased right hydrocephalus and improved right to left midline shift, now 1 mm previously 9 mm. Relatively unchanged large right temporal intraparenchymal hemorrhage and tiny parafalcine subdural hematoma. Small amount of left intraventricular hemorrhage now noted. 2. Atrophy, chronic small-vessel white matter ischemic changes and remote infarcts.   Ct Head Code Stroke Wo Contrast Result Date: 08/16/2017 IMPRESSION: 1. Intraparenchymal hematoma in the anterior right temporal lobe with volume of 11 cubic cm with associated intraventricular extension filling the right lateral ventricle temporal and occipital horns. 2. Leftward midline shift measuring 5-9 mm with early/impending  subfalcine herniation of the anterior cingulate gyrus.   Echocardiogram:                                               Study Conclusions - Left ventricle: The cavity size was normal. Systolic function was   normal. The estimated ejection fraction was in the range of 55%   to 60%. - Aortic valve: There was trivial regurgitation. - Mitral valve: Calcified annulus. Mildly thickened leaflets .   There was mild regurgitation.  Ct Head Wo Contrast  Result Date: 08/17/2017 CLINICAL DATA:  Follow-up hemorrhage. History of amyloid angiopathy. EXAM: CT HEAD WITHOUT CONTRAST TECHNIQUE: Contiguous axial images were obtained from the base of the skull through the vertex without intravenous contrast. COMPARISON:  CT HEAD August 17, 2017 at 1054 hours FINDINGS: BRAIN: Evolving RIGHT inferior basal ganglia/temporal lobe intraparenchymal hematoma with intraventricular extension with increasing vasogenic edema. Similar RIGHT greater than LEFT lateral ventricle dependent blood products. No hydrocephalus. 3 mm RIGHT to LEFT midline shift. Dense RIGHT frontal, RIGHT temporal and RIGHT parietal temporal lobe micro hemorrhages. LEFT parietal lobe and RIGHT parietoccipital lobe encephalomalacia. Patchy to confluent supratentorial white matter hypodensities. Old LEFT basal ganglia infarct. Old small cerebellar infarcts. 2 mm parafalcine acute subdural hematoma similar to decreased from prior CT. Basal cisterns are patent. VASCULAR: Moderate calcific atherosclerosis of the carotid siphons. SKULL: No skull fracture. No significant scalp soft tissue swelling. SINUSES/ORBITS: The mastoid air-cells and included paranasal sinuses are well-aerated.The included ocular globes and orbital contents are non-suspicious. OTHER: None. IMPRESSION: 1. Evolving RIGHT basal ganglia/temporal lobe hemorrhage with intraventricular extension. Similar 3 mm RIGHT to LEFT midline shift. No hydrocephalus. 2. Acute trace parafalcine subdural hematoma.  Evolving acute RIGHT cerebral micro hemorrhages seen with amyloid angiopathy. 3. Multiple old large vascular territory and small vessel infarcts. Electronically Signed   By: Awilda Metro M.D.   On: 08/17/2017 22:49   Ct Head Wo Contrast  Result Date: 08/17/2017 CLINICAL DATA:  84 year old male-follow-up intracranial hemorrhage. EXAM: CT HEAD WITHOUT CONTRAST TECHNIQUE: Contiguous axial images were obtained from the base of the skull through the vertex without intravenous contrast. COMPARISON:  08/16/2017 CT and prior studies FINDINGS: Brain: There has been little interval change of right temporal intraparenchymal hematoma with maximal transverse diameter of 2.8 cm (3:15). A large amount of right intraventricular hemorrhage is again identified. A small amount of hemorrhage within the left occipital horn is now noted. A tiny  parafalcine subdural hematoma measuring 1 mm is unchanged. Right lateral ventricle size has decreased. Fullness of the left lateral ventricle is again noted. Right to left midline shift now measures 1 mm, previously 9 mm. Atrophy, chronic small-vessel white matter ischemic changes and remote left frontoparietal, right MCA and left cerebellar infarcts again noted. Vascular: Atherosclerotic calcifications again noted. Skull: No acute abnormality. Sinuses/Orbits: No acute abnormality Other: None IMPRESSION: 1. Decreased right hydrocephalus and improved right to left midline shift, now 1 mm previously 9 mm. Relatively unchanged large right temporal intraparenchymal hemorrhage and tiny parafalcine subdural hematoma. Small amount of left intraventricular hemorrhage now noted. 2. Atrophy, chronic small-vessel white matter ischemic changes and remote infarcts. Electronically Signed   By: Harmon Pier M.D.   On: 08/17/2017 11:11        IMPRESSION: Mr. Aahil Fredin is a 84 y.o. male with PMH of atrial fibrillation on Eliquis presented for evaluation of acute onset of left-sided  weakness, left facial droop, slurred speech and rightward gaze deviation. Examination revealed an NIH stroke scale of 18. CT scan shows right temporal lobe ICH with IVH.   Large Right temporal intraparenchymal hemorrhage and tiny parafalcine subdural hematoma.  Suspected Etiology: Likely HTN hemorrhage  in the setting of underlying coagulopathy by Eliquis. Resultant Symptoms: fixed rightward gaze, left upper extremity plegia, left lower extremity paresis, dense sensory loss and neglect of the left side. Stroke Risk Factors: atrial fibrillation, hyperlipidemia and hypertension Other Stroke Risk Factors: Advanced age, Hx stroke  Outstanding Stroke Work-up Studies:     MRI brain  08/18/2017 ASSESSMENT:   Neuro exam stable with left sided weakness. Patient hallucinating on exam. Likely undiagnosed dementia at baseline. MRI pending. CCM consulted for medical issues. 3% in progress with femoral line. PICC line ordered so that patient may participate in therapies once medically stable.   PLAN  08/18/2017: HOLD ASA until 24 hour post tPA neuroimaging is stable & without evidence of bleeding Will add statin in necessary Frequent neuro checks Telemetry monitoring PT/OT/SLP Consult PM & Rehab Consult Case Management /MSW Ongoing aggressive stroke risk factor management Patient's family counseled to be compliant with his antithrombotic medications Patient's family counseled on Lifestyle modifications including, Diet, Exercise, and Stress Follow up with [facility] in 6 weeks  HX OF STROKES: remote left frontoparietal, right MCA and left cerebellar infarcts  DYSPHAGIA: Passed SLP swallow evaluation Aspiration Precautions in progress  CEREBRAL EDEMA: 3% NS Infusion per order set. Sodium goal 150-155. Na Levels every 6 hrs No need for Neurosurgery Consultation at this time High risk for respiratory compromise & progression of cerebral edema next 2-3 days. Peak swelling from  strokes is usually 3-5 days. Considerstatneurosurgical consultation for hemicraniectomy for mentation worsens or if there is any imaging evidence of herniation  AFIB, CHRONIC: Hx of atrial fibrillation on Eliquis last dose taken the morning of 08/16/2017 reversal of eliquis with K-centra per pharmacy protocol and d/c Eliquis Rate control -will appreciate PCCM consult for medical management assistance while in ICU  MEDICAL ISSUES: Hypertensive Encephalopathy B/P's stable overnight Will continue to monitor closely  AKI IVF's in progress Repeat labs in AM  Anemia on Admission Stable this AM Repeat labs in AM Will continue to monitor  Hypo Calcium Replacement in progress Repeat Labs in AM  Likely Baseline Dementia with sundowning Will add Seroquel low dose BID Ativan PRN Will continue to monitor closely Encourage family to stay with patient at all times Lab work up in progress  BPH Home  meds restarted Discontinue foley when possible Urinalysis pending  HYPERTENSION: Stable BP goal: 140 SBP range Nicardipine drip, Labetolol PRN Long term BP goal normotensive. Home Meds: NONE  HYPERLIPIDEMIA: No results found for: CHOL, TRIG, HDL, CHOLHDL, VLDL, LDLCALC-  PENDING Home Meds:  NONE LDL  goal < 70 Will start on statin if needed Continue statin at discharge, if needed  DIABETES: No results found for: HGBA1C - PENDING Recent Labs  Lab 08/16/17 2238 08/18/17 0324  GLUCAP 68 103*  HgbA1c goal < 7.0  Other Active Problems: Principal Problem:   Hemorrhage of right temporal lobe (HCC) Active Problems:   Hydrocephalus   Intraventricular hemorrhage (HCC)   Subdural hematoma (HCC)   Dysarthria due to acute stroke (HCC)   Bacteriuria with pyuria    Hospital day # 2 VTE prophylaxis:SCD's  Diet : DIET - DYS 1 Room service appropriate? Yes; Fluid consistency: Thin   FAMILY UPDATES: No family at bedside  TEAM UPDATES: Marvel Plan, MD STATUS:  DNR     Prior  Home Stroke Medications:  aspirin 81 mg daily and Eliquis (apixaban) daily  Discharge Stroke Meds:  Please discharge patient on TBD   Disposition: 01-Home or Self Care Therapy Recs:               PENDING Home Equipment:         PENDING Follow Up:  Follow-up Information    Micki Riley, MD. Schedule an appointment as soon as possible for a visit in 6 week(s).   Specialties:  Neurology, Radiology Contact information: [address] [place] [address] [phone]          [facility] Follow up in 1-2 weeks       Attending note:   I have personally examined this patient, reviewed notes, independently viewed imaging studies, participated in medical decision making and plan of care. ROS completed by me personally and pertinent positives fully documented  I have made any additions or clarifications directly to the above note. Agree with note above.    84 year old male with history of stroke without residual, A. fib on Eliquis admitted for left-sided weakness headache and right-sided gaze, left-sided neglect.  CT showed right basal ganglia and temporal lobe ICH with IVH, as well as chronic left MCA infarct.  Status post Kcentra reversed.  Repeat CT showed stable hemorrhage and midline shift as well as IVH.  MRI not able to be done due to agitation.  EF 55-60%.  LDL A1c pending.  He still had intermittent agitation, giving Ativan as needed.  Still drowsy sleepy this morning.  On 3% saline.  Repeat CT is stable, will taper off 3% saline.  On Cardizem for BP control, changed to Cleviprex for volume control.  Due to mental status, no need NG tube for medication and nutrition.  At amantadine for arousal during the day.  Continue Seroquel at night for sundowning.  Found to have a mild UTI, on Rocephin now.  Marvel Plan, MD PhD Stroke Neurology 08/18/2017 10:19 AM  This patient is critically ill and at significant risk of neurological worsening, death and care  requires constant monitoring of vital signs, hemodynamics,respiratory and cardiac monitoring, extensive review of multiple databases, frequent neurological assessment, discussion with family, other specialists and medical decision making of high complexity.I have made any additions or clarifications directly to the above note.This critical care time does not reflect procedure time, or teaching time or supervisory time of PA/NP/Med Resident etc but could  involve care discussion time.  I spent 40 minutes of neurocritical care time  in the care of  this patient.      To contact Stroke Continuity provider, please refer to [email or web address]. After hours, contact General Neurology

## 2017-08-18 NOTE — Progress Notes (Signed)
[facility] Pulmonary Diseases & Critical Care Medicine Initial Consultation  Patient Name: [name] MRN: [id number] DOB: 1933/09/05    ADMISSION DATE:  08/16/2017 CONSULTATION DATE:  08/16/2017  REFERRING MD:  Delia Heady, MD  REASON FOR CONSULTATION: Critical care support  HISTORY OF PRESENT ILLNESS  This 84 y.o. Caucasian male is seen in consultation at the request of Dr. Delia Heady for recommendations on further evaluation and management of intraventricular hemorrhage. The patient was admitted with complaints of L sided weakness. EMS was called and found the patient to have SBP 200+. He was found by Dr. Pearlean Brownie to have R gaze preference, LUE paraplegi, LLE paresis and dense senory loss and neglect of the L side. Head CT showed R temporal ICH with IVH. We are consulted due to concerns for possible respiratory compromise.   Subjective Got Ativan last night, was agitated initially then sedated this ended up resulting in CT scan that did not show significant change  Objective VITAL SIGNS: BP (!) 128/58   Pulse 74   Temp 100 F (37.8 C) (Axillary)   Resp (!) 28   Ht 5\' 11"  (1.803 m)   Wt 177 lb 14.6 oz (80.7 kg)   SpO2 95%   BMI 24.81 kg/m   HEMODYNAMICS:    VENTILATOR SETTINGS:    INTAKE / OUTPUT:  Intake/Output Summary (Last 24 hours) at 08/18/2017 1334 Last data filed at 08/18/2017 1323 Gross per 24 hour  Intake 2816.5 ml  Output 2050 ml  Net 766.5 ml     PHYSICAL EXAMINATION: General: This 84 year old white male currently resting comfortably in bed, somewhat restless at times HEENT: Normocephalic atraumatic mucous membranes moist nasal gastric tube in place Neuro: Awake, moves all extremities, very dysarthric Pulmonary: Clear to auscultation occasional snoring respirations no accessory use Cardiac: Regular irregular Abdomen: Soft nontender Extremities: No edema  LABS:  BMET Recent Labs  Lab 08/16/17 2240 08/16/17 2250   08/17/17 2105 08/18/17 0528 08/18/17 0638  NA 136 142   < > 167* 145 146*  K 3.5 3.7  --   --  3.0* 3.2*  CL 103 104  --   --  116* 115*  CO2 25  --   --   --  22 23  BUN 15 18  --   --  9 9  CREATININE 1.16 1.10  --   --  0.79 0.81  GLUCOSE 80 80  --   --  110* 126*   < > = values in this interval not displayed.    Electrolytes Recent Labs  Lab 08/16/17 2240 08/18/17 0528 08/18/17 0638  CALCIUM 8.8* 8.5* 8.9  MG  --  1.9 2.0  PHOS  --  2.4* 2.5    CBC Recent Labs  Lab 08/16/17 2240 08/16/17 2250 08/18/17 0638  WBC 7.3  --  9.7  HGB 13.2 13.6 13.4  HCT 39.8 40.0 40.3  PLT 245  --  231    Coag's Recent Labs  Lab 08/16/17 2240  APTT 38*  INR 1.04    Sepsis Markers No results for input(s): LATICACIDVEN, PROCALCITON, O2SATVEN in the last 168 hours.  ABG No results for input(s): PHART, PCO2ART, PO2ART in the last 168 hours.  Liver Enzymes Recent Labs  Lab 08/16/17 2240  AST 23  ALT 27  ALKPHOS 74  BILITOT 0.9  ALBUMIN 3.8    Cardiac Enzymes No results for input(s): TROPONINI, PROBNP in the last 168 hours.  Glucose Recent Labs  Lab 08/16/17 2238 08/18/17  0324  GLUCAP 68 103*    Imaging Ct Head Wo Contrast  Result Date: 08/17/2017 CLINICAL DATA:  Follow-up hemorrhage. History of amyloid angiopathy. EXAM: CT HEAD WITHOUT CONTRAST TECHNIQUE: Contiguous axial images were obtained from the base of the skull through the vertex without intravenous contrast. COMPARISON:  CT HEAD August 17, 2017 at 1054 hours FINDINGS: BRAIN: Evolving RIGHT inferior basal ganglia/temporal lobe intraparenchymal hematoma with intraventricular extension with increasing vasogenic edema. Similar RIGHT greater than LEFT lateral ventricle dependent blood products. No hydrocephalus. 3 mm RIGHT to LEFT midline shift. Dense RIGHT frontal, RIGHT temporal and RIGHT parietal temporal lobe micro hemorrhages. LEFT parietal lobe and RIGHT parietoccipital lobe encephalomalacia. Patchy to  confluent supratentorial white matter hypodensities. Old LEFT basal ganglia infarct. Old small cerebellar infarcts. 2 mm parafalcine acute subdural hematoma similar to decreased from prior CT. Basal cisterns are patent. VASCULAR: Moderate calcific atherosclerosis of the carotid siphons. SKULL: No skull fracture. No significant scalp soft tissue swelling. SINUSES/ORBITS: The mastoid air-cells and included paranasal sinuses are well-aerated.The included ocular globes and orbital contents are non-suspicious. OTHER: None. IMPRESSION: 1. Evolving RIGHT basal ganglia/temporal lobe hemorrhage with intraventricular extension. Similar 3 mm RIGHT to LEFT midline shift. No hydrocephalus. 2. Acute trace parafalcine subdural hematoma. Evolving acute RIGHT cerebral micro hemorrhages seen with amyloid angiopathy. 3. Multiple old large vascular territory and small vessel infarcts. Electronically Signed   By: Awilda Metroourtnay  Bloomer M.D.   On: 08/17/2017 22:49   Dg Chest Port 1 View  Result Date: 08/18/2017 CLINICAL DATA:  Fever EXAM: PORTABLE CHEST 1 VIEW COMPARISON:  03/06/2017 FINDINGS: Right PICC line in place with the tip at the cavoatrial junction. Heart is normal size. No confluent airspace opacities or effusions. No acute bony abnormality. IMPRESSION: No active disease. Electronically Signed   By: Charlett NoseKevin  Dover M.D.   On: 08/18/2017 11:24   Dg Abd Portable 1v  Result Date: 08/18/2017 CLINICAL DATA:  encounter for feeding tube placement EXAM: PORTABLE ABDOMEN - 1 VIEW COMPARISON:  None. FINDINGS: Two views of the abdomen and pelvis. Feeding tube terminates at the distal stomach. Numerous leads and wires project over the lower chest and upper abdomen. Non-obstructive bowel gas pattern. Bilateral hip arthroplasty. IMPRESSION: Feeding tube terminating at the distal stomach. Electronically Signed   By: Jeronimo GreavesKyle  Talbot M.D.   On: 08/18/2017 11:51    CULTURES: UC 1/11>>>  ASSESSMENT / PLAN: Principal Problem:   Hemorrhage of  right temporal lobe (HCC) Active Problems:   Hydrocephalus   Intraventricular hemorrhage (HCC)   Subdural hematoma (HCC)   Dysarthria due to acute stroke (HCC)   Bacteriuria with pyuria   Large right temporal intraparenchymal hemorrhage with small parafalcine subdural hematoma, complicated by cerebral edema Concern for undiagnosed dementia at baseline Etiology felt possibly hypertension complicated by Eliquis Residual dysarthria Peak swelling risk at 3-6 days Plan Holding aspirin given ICH Serial neuro checks 3% saline infusion with goal sodium 150-155 Sodium levels every 6 hours Minimize sedating medications, neurology has added Seroquel at at bedtime as well as daytime amantadine Eventually will need statin therapy PT OT SLP when appropriate Watching closely for risk of respiratory compromise  Severe dysphagia Plan Feeding tube placed Speech following N.p.o.   Hypertensive encephalopathy Plan Has as needed Cleviprex Goal range blood pressure around 140  History of atrial fibrillation Plan No anticoagulation given intracranial hemorrhage Continue telemetry monitoring  Hypokalemia Plan Replace and recheck as needed  BPH Plan  continue home meds  Possible urinary tract infection Plan Day #1 Rocephin Follow-up  cultures  We will be available as needed, continue supportive care.  The patient is DO NOT RESUSCITATE, should he decompensate the family would not want surgical intervention nor intubation  Simonne Martinet ACNP-BC [facility] Pulmonary/Critical Care Pager # [phone] OR # [phone] if no answer  Attending Note:  84 year old male with ICH due to hypertension.  Patient is on 3% saline and is a full DNR.  On exam, lungs are clear.  I reviewed CXR myself, no acute disease noted.  Discussed with PCCM-NP.  ICH:  - Hold ASA for ICH  Cerebral edema:  - 3% saline infusion with target Na of 150-155  - Check Na level q6  Dysphagia:  -  SLP  Hypertension:  - Cleviprex  - Blood pressure range of 140  Hypokalemia:  - Replace  - BMET in AM  PCCM will sign off, please call back if needed.  Alyson Reedy, M.D. [facility] Pulmonary/Critical Care Medicine. Pager: [phone]. After hours pager: [phone].

## 2017-08-18 NOTE — Progress Notes (Signed)
Initial Nutrition Assessment  DOCUMENTATION CODES:  Not applicable  INTERVENTION:  Initiate TF via NGT with Vital 1.5 at goal rate of 55 ml/h (1320 ml per day) and Prostat 30 ml q24 hrs to provide 2080 kcals, 104 gm protein, 1008 ml free water daily.  NUTRITION DIAGNOSIS:  Inadequate oral intake related to inability to eat, lethargy/confusion as evidenced by NPO status.  GOAL:  Patient will meet greater than or equal to 90% of their needs  MONITOR:  Diet advancement, Weight trends, I & O's, TF tolerance  REASON FOR ASSESSMENT:  Consult Enteral/tube feeding initiation and management  ASSESSMENT:  84 y/o male PMHx CVA w/o deficit, Afib, GERD. Developed AMS and Left sided weakness at [facility]. EMS found pt to have elevated BP. Imaging in ED revealed ICH w/ IVH. RD consulted for small bore NGT placement and initiation of TF.   Cortrak placed today w/ difficulty as patient was quite combative, pulling out lines and thrashing in bed. Required assistance from two others to place. Confirmation XRAY revealed successful gastric placement.   No family/friends at bedside. As such, unable to obtain any nutritional hx from patient.   The last charted weight from patient was in last summer when patient was 174 lbs or 78.9 kg. Todays bed weight is 80.7 kg, indicating weight stability.   Displays mild wasting of gastrocnemius and clavicular musculature, but felt related to age related sarcopenia as opposed to malnutrition.   Has Cleviprex ordered, but currently at 0 ml/hr, RN believes he wont need the way his BP is holding now  Labs: K: 3.2, BG: 110-130, B12 low normal Meds:  Versed, PPI, Seroquel, Senna,   Recent Labs  Lab 08/16/17 2240 08/16/17 2250  08/17/17 2105 08/18/17 0528 08/18/17 0638  NA 136 142   < > 167* 145 146*  K 3.5 3.7  --   --  3.0* 3.2*  CL 103 104  --   --  116* 115*  CO2 25  --   --   --  22 23  BUN 15 18  --   --  9 9  CREATININE 1.16 1.10  --   --  0.79  0.81  CALCIUM 8.8*  --   --   --  8.5* 8.9  MG  --   --   --   --  1.9 2.0  PHOS  --   --   --   --  2.4* 2.5  GLUCOSE 80 80  --   --  110* 126*   < > = values in this interval not displayed.   NUTRITION - FOCUSED PHYSICAL EXAM:   Most Recent Value  Orbital Region  No depletion  Upper Arm Region  No depletion  Thoracic and Lumbar Region  Mild depletion  Buccal Region  No depletion  Temple Region  No depletion  Clavicle Bone Region  Mild depletion  Clavicle and Acromion Bone Region  No depletion  Scapular Bone Region  Unable to assess  Dorsal Hand  No depletion  Patellar Region  No depletion  Anterior Thigh Region  Mild depletion  Posterior Calf Region  Mild depletion  Edema (RD Assessment)  None       Diet Order:  Diet NPO time specified  EDUCATION NEEDS:  No education needs have been identified at this time  Skin:  Skin Assessment: Reviewed RN Assessment  Last BM:  Unknown  Height:  Ht Readings from Last 1 Encounters:  08/16/17 5\' 11"  (1.803 m)  Weight:  Wt Readings from Last 1 Encounters:  08/18/17 177 lb 14.6 oz (80.7 kg)   Wt Readings from Last 10 Encounters:  08/18/17 177 lb 14.6 oz (80.7 kg)  03/06/17 174 lb (78.9 kg)  02/28/17 174 lb (78.9 kg)  02/13/17 174 lb (78.9 kg)  08/31/16 190 lb (86.2 kg)  08/09/16 190 lb (86.2 kg)  04/21/16 187 lb 14.4 oz (85.2 kg)  04/20/16 190 lb (86.2 kg)   Ideal Body Weight:  78.18 kg  BMI:  Body mass index is 24.81 kg/m.  Estimated Nutritional Needs:  Kcal:  1850-2100 (23-26 kcal/kg bw) Protein:  81-95g Pro (1-1.2 g/kg bw) Fluid:  >2 L (25 ml/kg bw)  Christophe Louis RD, LDN, CNSC Clinical Nutrition Pager: [phone] 08/18/2017 12:18 PM

## 2017-08-18 NOTE — Progress Notes (Signed)
Rehab Admissions Coordinator Note:  Patient was screened by Clois DupesBoyette, Rudell Ortman Godwin for appropriateness for an Inpatient Acute Rehab Consul per PT and OT once pt becomes more participative. .  At this time, we are recommending await to order an inpt rehab consult until pt able to participated more with therapy. . I will follow.  Clois DupesBoyette, Lillie Portner Godwin 08/18/2017, 10:22 AM  I can be reached at [phone].

## 2017-08-18 NOTE — Progress Notes (Signed)
Cortrak Tube Team Note:  Consult received to place a Cortrak feeding tube.   A 10 F Cortrak tube was placed in the L nare and secured with a nasal bridle at 80 cm. Per the Cortrak monitor reading the tube tip is located in stomach   Due to substantial thrashing by patient and slightly abnormal imaging on cortrak monitor, X-ray is required, abdominal x-ray has been ordered by the Cortrak team. Please confirm tube placement before using the Cortrak tube.   If the tube becomes dislodged please keep the tube and contact the Cortrak team at [email or web address] (password [email or web address]) for replacement.  If after hours and replacement cannot be delayed, place a NG tube and confirm placement with an abdominal x-ray.   [name] RD, LDN, CNSC Clinical Nutrition Pager: [phone] 08/18/2017 11:55 AM

## 2017-08-19 ENCOUNTER — Inpatient Hospital Stay (HOSPITAL_COMMUNITY): Payer: Medicare Other

## 2017-08-19 DIAGNOSIS — I10 Essential (primary) hypertension: Secondary | ICD-10-CM

## 2017-08-19 DIAGNOSIS — I482 Chronic atrial fibrillation: Secondary | ICD-10-CM

## 2017-08-19 DIAGNOSIS — E785 Hyperlipidemia, unspecified: Secondary | ICD-10-CM

## 2017-08-19 DIAGNOSIS — G919 Hydrocephalus, unspecified: Secondary | ICD-10-CM

## 2017-08-19 LAB — RPR: RPR Ser Ql: NONREACTIVE

## 2017-08-19 LAB — SODIUM
SODIUM: 148 mmol/L — AB (ref 135–145)
SODIUM: 150 mmol/L — AB (ref 135–145)
Sodium: 146 mmol/L — ABNORMAL HIGH (ref 135–145)
Sodium: 151 mmol/L — ABNORMAL HIGH (ref 135–145)

## 2017-08-19 LAB — GLUCOSE, CAPILLARY
GLUCOSE-CAPILLARY: 147 mg/dL — AB (ref 65–99)
GLUCOSE-CAPILLARY: 150 mg/dL — AB (ref 65–99)
GLUCOSE-CAPILLARY: 152 mg/dL — AB (ref 65–99)
GLUCOSE-CAPILLARY: 157 mg/dL — AB (ref 65–99)
Glucose-Capillary: 127 mg/dL — ABNORMAL HIGH (ref 65–99)
Glucose-Capillary: 139 mg/dL — ABNORMAL HIGH (ref 65–99)

## 2017-08-19 LAB — PHOSPHORUS: PHOSPHORUS: 2.6 mg/dL (ref 2.5–4.6)

## 2017-08-19 LAB — MAGNESIUM: MAGNESIUM: 2.1 mg/dL (ref 1.7–2.4)

## 2017-08-19 LAB — HEMOGLOBIN A1C
Hgb A1c MFr Bld: 5.8 % — ABNORMAL HIGH (ref 4.8–5.6)
Mean Plasma Glucose: 119.76 mg/dL

## 2017-08-19 MED ORDER — ATORVASTATIN CALCIUM 10 MG PO TABS
20.0000 mg | ORAL_TABLET | Freq: Every evening | ORAL | Status: DC
  Administered 2017-08-19 – 2017-08-24 (×5): 20 mg via ORAL
  Filled 2017-08-19 (×2): qty 1
  Filled 2017-08-19 (×3): qty 2

## 2017-08-19 MED ORDER — SODIUM CHLORIDE 1 G PO TABS
2.0000 g | ORAL_TABLET | Freq: Two times a day (BID) | ORAL | Status: DC
  Administered 2017-08-19: 2 g via ORAL
  Filled 2017-08-19: qty 2

## 2017-08-19 MED ORDER — SERTRALINE HCL 50 MG PO TABS
25.0000 mg | ORAL_TABLET | Freq: Every day | ORAL | Status: DC
  Administered 2017-08-19 – 2017-08-25 (×6): 25 mg via ORAL
  Filled 2017-08-19 (×8): qty 1

## 2017-08-19 MED ORDER — QUETIAPINE FUMARATE 25 MG PO TABS
50.0000 mg | ORAL_TABLET | Freq: Every day | ORAL | Status: DC
  Administered 2017-08-19: 50 mg via ORAL
  Filled 2017-08-19: qty 2

## 2017-08-19 MED ORDER — AMLODIPINE BESYLATE 10 MG PO TABS
10.0000 mg | ORAL_TABLET | Freq: Every day | ORAL | Status: DC
  Administered 2017-08-19 – 2017-08-25 (×6): 10 mg via ORAL
  Filled 2017-08-19 (×8): qty 1

## 2017-08-19 MED ORDER — LOSARTAN POTASSIUM 50 MG PO TABS
50.0000 mg | ORAL_TABLET | Freq: Every day | ORAL | Status: DC
  Administered 2017-08-19: 50 mg via ORAL
  Filled 2017-08-19: qty 1

## 2017-08-19 NOTE — Progress Notes (Signed)
NEUROHOSPITALISTS STROKE TEAM - DAILY PROGRESS NOTE   SUBJECTIVE (INTERVAL HISTORY) No family is at the bedside. He again got ativan 430am today. Not open eyes on voice, but mumbling intelligible words. Moving all extremities but LLE seems weaker than right. Had NG tube. CT repeat again stable, will continue to taper off 3% saline.     OBJECTIVE Lab Results: CBC:  Recent Labs  Lab 08/16/17 2240 08/16/17 2250 08/18/17 0638  WBC 7.3  --  9.7  HGB 13.2 13.6 13.4  HCT 39.8 40.0 40.3  MCV 92.3  --  92.2  PLT 245  --  231   BMP: Recent Labs  Lab 08/16/17 2240 08/16/17 2250  08/18/17 0528 08/18/17 0638 08/18/17 2137 08/19/17 0318 08/19/17 0915  NA 136 142   < > 145 146* 147*  146* 148* 146*  K 3.5 3.7  --  3.0* 3.2*  --   --   --   CL 103 104  --  116* 115*  --   --   --   CO2 25  --   --  22 23  --   --   --   GLUCOSE 80 80  --  110* 126*  --   --   --   BUN 15 18  --  9 9  --   --   --   CREATININE 1.16 1.10  --  0.79 0.81  --   --   --   CALCIUM 8.8*  --   --  8.5* 8.9  --   --   --   MG  --   --   --  1.9 2.0 2.2 2.1  --   PHOS  --   --   --  2.4* 2.5 2.6 2.6  --    < > = values in this interval not displayed.   Liver Function Tests:  Recent Labs  Lab 08/16/17 2240  AST 23  ALT 27  ALKPHOS 74  BILITOT 0.9  PROT 6.7  ALBUMIN 3.8   Recent Labs    08/16/17 2240  APTT 38*  INR 1.04   Urine Drug Screen:     Component Value Date/Time   LABOPIA NONE DETECTED 02/13/2017 0908   COCAINSCRNUR NONE DETECTED 02/13/2017 0908   LABBENZ NONE DETECTED 02/13/2017 0908   AMPHETMU NONE DETECTED 02/13/2017 0908   THCU NONE DETECTED 02/13/2017 0908   LABBARB NONE DETECTED 02/13/2017 0908    PHYSICAL EXAM Temp:  [98.4 F (36.9 C)-99.7 F (37.6 C)] 99.7 F (37.6 C) (01/13 1141) Pulse Rate:  [59-105] 105 (01/13 1200) Resp:  [16-38] 29 (01/13 1200) BP: (80-236)/(53-217) 143/116 (01/13 1100) SpO2:  [84 %-100 %] 99  % (01/13 1200) Weight:  [167 lb 5.3 oz (75.9 kg)] 167 lb 5.3 oz (75.9 kg) (01/13 0426) General - Well nourished, well developed elderly Caucasian male, in no apparent distress HEENT-  Normocephalic, Normal external eye/conjunctiva.  Normal external ears. Normal external nose, mucus membranes and septum.   Cardiovascular - Regular rate and rhythm  Respiratory - Lungs clear bilaterally. No wheezing. Abdomen - soft and non-tender, BS normal Extremities- no edema or cyanosis Neurological Exam : Mental status: eyes closed, not open to voice or pain, not answering orientation questions. Speech is severely dysarthric.  Cranial nerves: Pupils equal round reactive to light but against eye opening with me. No significant facial drop on exam. Motor exam: b/l upper extremity moving equally, 3/5 left lower extremity, 4/5 right lower extremities. Normal  tone normal range of motion in all 4 extremities Sensory and coordination exam: not cooperative Gait exam was deferred  IMAGING: I have personally reviewed the radiological images below and agree with the radiology interpretations. Ct Head Wo Contrast Result Date: 08/17/2017 IMPRESSION: 1. Decreased right hydrocephalus and improved right to left midline shift, now 1 mm previously 9 mm. Relatively unchanged large right temporal intraparenchymal hemorrhage and tiny parafalcine subdural hematoma. Small amount of left intraventricular hemorrhage now noted. 2. Atrophy, chronic small-vessel white matter ischemic changes and remote infarcts.   Ct Head Code Stroke Wo Contrast Result Date: 08/16/2017 IMPRESSION: 1. Intraparenchymal hematoma in the anterior right temporal lobe with volume of 11 cubic cm with associated intraventricular extension filling the right lateral ventricle temporal and occipital horns. 2. Leftward midline shift measuring 5-9 mm with early/impending subfalcine herniation of the anterior cingulate gyrus.   Echocardiogram:                                                Study Conclusions - Left ventricle: The cavity size was normal. Systolic function was   normal. The estimated ejection fraction was in the range of 55%   to 60%. - Aortic valve: There was trivial regurgitation. - Mitral valve: Calcified annulus. Mildly thickened leaflets .   There was mild regurgitation.  Ct Head Wo Contrast  Result Date: 08/17/2017 CLINICAL DATA:  Follow-up hemorrhage. History of amyloid angiopathy. EXAM: CT HEAD WITHOUT CONTRAST TECHNIQUE: Contiguous axial images were obtained from the base of the skull through the vertex without intravenous contrast. COMPARISON:  CT HEAD August 17, 2017 at 1054 hours FINDINGS: BRAIN: Evolving RIGHT inferior basal ganglia/temporal lobe intraparenchymal hematoma with intraventricular extension with increasing vasogenic edema. Similar RIGHT greater than LEFT lateral ventricle dependent blood products. No hydrocephalus. 3 mm RIGHT to LEFT midline shift. Dense RIGHT frontal, RIGHT temporal and RIGHT parietal temporal lobe micro hemorrhages. LEFT parietal lobe and RIGHT parietoccipital lobe encephalomalacia. Patchy to confluent supratentorial white matter hypodensities. Old LEFT basal ganglia infarct. Old small cerebellar infarcts. 2 mm parafalcine acute subdural hematoma similar to decreased from prior CT. Basal cisterns are patent. VASCULAR: Moderate calcific atherosclerosis of the carotid siphons. SKULL: No skull fracture. No significant scalp soft tissue swelling. SINUSES/ORBITS: The mastoid air-cells and included paranasal sinuses are well-aerated.The included ocular globes and orbital contents are non-suspicious. OTHER: None. IMPRESSION: 1. Evolving RIGHT basal ganglia/temporal lobe hemorrhage with intraventricular extension. Similar 3 mm RIGHT to LEFT midline shift. No hydrocephalus. 2. Acute trace parafalcine subdural hematoma. Evolving acute RIGHT cerebral micro hemorrhages seen with amyloid angiopathy. 3. Multiple old large  vascular territory and small vessel infarcts. Electronically Signed   By: Awilda Metroourtnay  Bloomer M.D.   On: 08/17/2017 22:49   Ct Head Wo Contrast  Result Date: 08/17/2017 CLINICAL DATA:  84 year old male-follow-up intracranial hemorrhage. EXAM: CT HEAD WITHOUT CONTRAST TECHNIQUE: Contiguous axial images were obtained from the base of the skull through the vertex without intravenous contrast. COMPARISON:  08/16/2017 CT and prior studies FINDINGS: Brain: There has been little interval change of right temporal intraparenchymal hematoma with maximal transverse diameter of 2.8 cm (3:15). A large amount of right intraventricular hemorrhage is again identified. A small amount of hemorrhage within the left occipital horn is now noted. A tiny parafalcine subdural hematoma measuring 1 mm is unchanged. Right lateral ventricle size has decreased. Fullness of the left lateral  ventricle is again noted. Right to left midline shift now measures 1 mm, previously 9 mm. Atrophy, chronic small-vessel white matter ischemic changes and remote left frontoparietal, right MCA and left cerebellar infarcts again noted. Vascular: Atherosclerotic calcifications again noted. Skull: No acute abnormality. Sinuses/Orbits: No acute abnormality Other: None IMPRESSION: 1. Decreased right hydrocephalus and improved right to left midline shift, now 1 mm previously 9 mm. Relatively unchanged large right temporal intraparenchymal hemorrhage and tiny parafalcine subdural hematoma. Small amount of left intraventricular hemorrhage now noted. 2. Atrophy, chronic small-vessel white matter ischemic changes and remote infarcts. Electronically Signed   By: Harmon Pier M.D.   On: 08/17/2017 11:11   Ct Head Wo Contrast  Result Date: 08/19/2017 CLINICAL DATA:  84 y/o  M; follow-up of intracranial hemorrhage. EXAM: CT HEAD WITHOUT CONTRAST TECHNIQUE: Contiguous axial images were obtained from the base of the skull through the vertex without intravenous contrast.  COMPARISON:  08/17/2017 CT head.  02/13/2017 MRI head. FINDINGS: Brain: Stable hematoma centered in right basal ganglia extending into the right lateral ventricle atrium temporal horn. Stable small volume of hemorrhage in the left atria. Stable edema surrounding the hematoma and associated mass effect with 3 mm right-to-left midline shift. No new acute hemorrhage or infarct identified. Stable foci of encephalomalacia in the left parietal and right occipital lobes. Vascular: Calcific atherosclerosis of carotid siphons. Skull: Normal. Negative for fracture or focal lesion. Sinuses/Orbits: Opacification of left mastoid tip and mild paranasal sinus mucosal thickening. Otherwise negative. Other: None. IMPRESSION: Stable hematoma in right basal ganglia and intraventricular hemorrhage in the right greater than left lateral ventricles. Stable associated edema and mass effect with 3 mm right-to-left midline shift. No new acute intracranial abnormality identified. Electronically Signed   By: Mitzi Hansen M.D.   On: 08/19/2017 13:07         IMPRESSION: [name] is a 84 y.o. male with PMH of atrial fibrillation on Eliquis presented for evaluation of acute onset of left-sided weakness, left facial droop, slurred speech and rightward gaze deviation. Examination revealed an NIH stroke scale of 18. CT scan shows right temporal lobe ICH with IVH.   Large Right temporal intraparenchymal hemorrhage and tiny parafalcine subdural hematoma.  Suspected Etiology: Likely HTN hemorrhage  in the setting of underlying coagulopathy by Eliquis. Resultant Symptoms: fixed rightward gaze, left upper extremity plegia, left lower extremity paresis, dense sensory loss and neglect of the left side. Stroke Risk Factors: atrial fibrillation, hyperlipidemia and hypertension Other Stroke Risk Factors: Advanced age, Hx stroke  Outstanding Stroke Work-up Studies:     MRI brain  08/19/2017 ASSESSMENT:   Neuro  exam stable with left sided weakness. Patient hallucinating on exam. Likely undiagnosed dementia at baseline. MRI pending. CCM consulted for medical issues. 3% in progress with femoral line. PICC line ordered so that patient may participate in therapies once medically stable.   PLAN  08/19/2017: HOLD ASA until 24 hour post tPA neuroimaging is stable & without evidence of bleeding Will add statin in necessary Frequent neuro checks Telemetry monitoring PT/OT/SLP Consult PM & Rehab Consult Case Management /MSW Ongoing aggressive stroke risk factor management Patient's family counseled to be compliant with his antithrombotic medications Patient's family counseled on Lifestyle modifications including, Diet, Exercise, and Stress Follow up with [facility] in 6 weeks  HX OF STROKES: remote left frontoparietal, right MCA and left cerebellar infarcts  DYSPHAGIA: Passed SLP swallow evaluation Aspiration Precautions in progress  CEREBRAL EDEMA: 3% NS Infusion per order set.  Sodium goal 150-155. Na Levels every 6 hrs No need for Neurosurgery Consultation at this time High risk for respiratory compromise & progression of cerebral edema next 2-3 days. Peak swelling from strokes is usually 3-5 days. Considerstatneurosurgical consultation for hemicraniectomy for mentation worsens or if there is any imaging evidence of herniation  AFIB, CHRONIC: Hx of atrial fibrillation on Eliquis last dose taken the morning of 08/16/2017 reversal of eliquis with K-centra per pharmacy protocol and d/c Eliquis Rate control -will appreciate PCCM consult for medical management assistance while in ICU  MEDICAL ISSUES: Hypertensive Encephalopathy B/P's stable overnight Will continue to monitor closely  AKI IVF's in progress Repeat labs in AM  Anemia on Admission Stable this AM Repeat labs in AM Will continue to monitor  Hypo Calcium Replacement in progress Repeat Labs in AM  Likely  Baseline Dementia with sundowning Will add Seroquel low dose BID Ativan PRN Will continue to monitor closely Encourage family to stay with patient at all times Lab work up in progress  BPH Home meds restarted Discontinue foley when possible Urinalysis pending  HYPERTENSION: Stable BP goal: 140 SBP range Nicardipine drip, Labetolol PRN Long term BP goal normotensive. Home Meds: NONE  HYPERLIPIDEMIA:    Component Value Date/Time   CHOL 197 08/18/2017 2137   TRIG 132 08/18/2017 2137   HDL 61 08/18/2017 2137   CHOLHDL 3.2 08/18/2017 2137   VLDL 26 08/18/2017 2137   LDLCALC 110 (H) 08/18/2017 2137  -  PENDING Home Meds:  NONE LDL  goal < 70 Will start on statin if needed Continue statin at discharge, if needed  DIABETES: Lab Results  Component Value Date   HGBA1C 5.8 (H) 08/18/2017   - PENDING Recent Labs  Lab 08/18/17 1943 08/18/17 2335 08/19/17 0340 08/19/17 0757 08/19/17 1139  GLUCAP 119* 150* 147* 127* 157*  HgbA1c goal < 7.0  Other Active Problems: Principal Problem:   Hemorrhage of right temporal lobe (HCC) Active Problems:   Hydrocephalus   Intraventricular hemorrhage (HCC)   Subdural hematoma (HCC)   Dysarthria due to acute stroke (HCC)   Bacteriuria with pyuria    Hospital day # 3 VTE prophylaxis:SCD's  Diet : Diet NPO time specified   FAMILY UPDATES: No family at bedside  TEAM UPDATES: Marvel Plan, MD STATUS:  DNR     Prior Home Stroke Medications:  aspirin 81 mg daily and Eliquis (apixaban) daily  Discharge Stroke Meds:  Please discharge patient on TBD   Disposition: 01-Home or Self Care Therapy Recs:               PENDING Home Equipment:         PENDING Follow Up:  Follow-up Information    Micki Riley, MD. Schedule an appointment as soon as possible for a visit in 6 week(s).   Specialties:  Neurology, Radiology Contact information: [address] [place] [address] [phone]          [facility] Follow up in 1-2 weeks       Attending note:   I have personally examined this patient, reviewed notes, independently viewed imaging studies, participated in medical decision making and plan of care. ROS completed by me personally and pertinent positives fully documented  I have made any additions or clarifications directly to the above note. Agree with note above.    84 year old male with history of stroke without residual, A. fib on Eliquis admitted for left-sided weakness headache and right-sided gaze, left-sided  neglect.  CT showed right basal ganglia and temporal lobe ICH with IVH, as well as chronic left MCA infarct.  Status post Kcentra reversed.  Repeat CT x 2 showed stable hemorrhage and midline shift as well as IVH.  MRI not able to be done due to agitation.  EF 55-60%.  LDL 110 and A1c 5.8.  He still had intermittent agitation, giving Ativan at early morning.  Still drowsy sleepy during round.  Will increase seroquel dose. On 3% saline as repeat CT is stable, continue to taper off 3% saline.  On Cleviprex for BP control, add BP po meds and taper off cleviprex as able.  Had NG tube for medication and on TF.  Continue Rocephin for mild UTI.   Marvel Plan, MD PhD Stroke Neurology 08/19/2017 2:03 PM  This patient is critically ill and at significant risk of neurological worsening, death and care requires constant monitoring of vital signs, hemodynamics,respiratory and cardiac monitoring, extensive review of multiple databases, frequent neurological assessment, discussion with family, other specialists and medical decision making of high complexity.I have made any additions or clarifications directly to the above note.This critical care time does not reflect procedure time, or teaching time or supervisory time of PA/NP/Med Resident etc but could involve care discussion time.  I spent 40 minutes of neurocritical care time  in the care of  this patient.      To contact Stroke  Continuity provider, please refer to [email or web address]. After hours, contact General Neurology

## 2017-08-19 NOTE — Progress Notes (Deleted)
MD called back with new orders 

## 2017-08-19 NOTE — Progress Notes (Signed)
Patient was tachypnea  In the 40's, MD paged to notify. MD called back. No new orders received at this time. Will continue to monitor.

## 2017-08-20 ENCOUNTER — Inpatient Hospital Stay (HOSPITAL_COMMUNITY): Payer: Medicare Other

## 2017-08-20 DIAGNOSIS — D72829 Elevated white blood cell count, unspecified: Secondary | ICD-10-CM

## 2017-08-20 DIAGNOSIS — Z7901 Long term (current) use of anticoagulants: Secondary | ICD-10-CM

## 2017-08-20 LAB — BASIC METABOLIC PANEL
ANION GAP: 7 (ref 5–15)
BUN: 29 mg/dL — ABNORMAL HIGH (ref 6–20)
CHLORIDE: 121 mmol/L — AB (ref 101–111)
CO2: 24 mmol/L (ref 22–32)
Calcium: 9.3 mg/dL (ref 8.9–10.3)
Creatinine, Ser: 0.84 mg/dL (ref 0.61–1.24)
GFR calc non Af Amer: >60 mL/min (ref >60)
GLUCOSE: 175 mg/dL — AB (ref 65–99)
POTASSIUM: 3.3 mmol/L — AB (ref 3.5–5.1)
Sodium: 152 mmol/L — ABNORMAL HIGH (ref 135–145)

## 2017-08-20 LAB — URINALYSIS, COMPLETE (UACMP) WITH MICROSCOPIC
Bilirubin Urine: NEGATIVE
Glucose, UA: NEGATIVE mg/dL
KETONES UR: NEGATIVE mg/dL
NITRITE: NEGATIVE
PROTEIN: 100 mg/dL — AB
Specific Gravity, Urine: 1.019 (ref 1.005–1.030)
Squamous Epithelial / LPF: NONE SEEN
pH: 5 (ref 5.0–8.0)

## 2017-08-20 LAB — URINE CULTURE: Culture: 50000 — AB

## 2017-08-20 LAB — GLUCOSE, CAPILLARY
GLUCOSE-CAPILLARY: 114 mg/dL — AB (ref 65–99)
GLUCOSE-CAPILLARY: 138 mg/dL — AB (ref 65–99)
Glucose-Capillary: 154 mg/dL — ABNORMAL HIGH (ref 65–99)
Glucose-Capillary: 158 mg/dL — ABNORMAL HIGH (ref 65–99)
Glucose-Capillary: 124 mg/dL — ABNORMAL HIGH (ref 65–99)
Glucose-Capillary: 150 mg/dL — ABNORMAL HIGH (ref 65–99)

## 2017-08-20 LAB — CBC
HEMATOCRIT: 42.4 % (ref 39.0–52.0)
HEMOGLOBIN: 14 g/dL (ref 13.0–17.0)
MCH: 31 pg (ref 26.0–34.0)
MCHC: 33 g/dL (ref 30.0–36.0)
MCV: 94 fL (ref 78.0–100.0)
Platelets: 234 10*3/uL (ref 150–400)
RBC: 4.51 MIL/uL (ref 4.22–5.81)
RDW: 14.2 % (ref 11.5–15.5)
WBC: 13.6 10*3/uL — AB (ref 4.0–10.5)

## 2017-08-20 LAB — TRIGLYCERIDES: Triglycerides: 163 mg/dL — ABNORMAL HIGH (ref <150)

## 2017-08-20 MED ORDER — LOSARTAN POTASSIUM 50 MG PO TABS
100.0000 mg | ORAL_TABLET | Freq: Every day | ORAL | Status: DC
  Administered 2017-08-20 – 2017-08-25 (×5): 100 mg via ORAL
  Filled 2017-08-20 (×7): qty 2

## 2017-08-20 MED ORDER — MUPIROCIN 2 % EX OINT
1.0000 "application " | TOPICAL_OINTMENT | Freq: Two times a day (BID) | CUTANEOUS | Status: AC
  Administered 2017-08-20 – 2017-08-25 (×10): 1 via NASAL
  Filled 2017-08-20: qty 22

## 2017-08-20 MED ORDER — CHLORHEXIDINE GLUCONATE CLOTH 2 % EX PADS
6.0000 | MEDICATED_PAD | Freq: Every day | CUTANEOUS | Status: DC
  Administered 2017-08-21 – 2017-08-24 (×4): 6 via TOPICAL

## 2017-08-20 MED ORDER — SODIUM CHLORIDE 0.9 % IV SOLN
INTRAVENOUS | Status: DC
  Administered 2017-08-20 – 2017-08-22 (×2): via INTRAVENOUS

## 2017-08-20 MED ORDER — SODIUM CHLORIDE 1 G PO TABS
1.0000 g | ORAL_TABLET | Freq: Two times a day (BID) | ORAL | Status: DC
  Administered 2017-08-20: 1 g via ORAL
  Filled 2017-08-20: qty 1

## 2017-08-20 MED ORDER — FLEET ENEMA 7-19 GM/118ML RE ENEM
1.0000 | ENEMA | Freq: Once | RECTAL | Status: AC
  Administered 2017-08-20: 1 via RECTAL
  Filled 2017-08-20: qty 1

## 2017-08-20 NOTE — Progress Notes (Signed)
MD called back. Cortrak to be replaced in the AM. Order to increase IV fluid received.

## 2017-08-20 NOTE — Progress Notes (Signed)
Patient's cortrack clogged. Efforts made to unclog was unsuccessful. MD paged to notify. Awaiting a call back.

## 2017-08-20 NOTE — Progress Notes (Signed)
PT Cancellation Note  Patient Details Name: [name] MRN: [id number] DOB: [birth year]   Cancelled Treatment:    Reason Eval/Treat Not Completed: Medical issues which prohibited therapy(per RN not medically appropriate)   Marlowe Lawes B Sonyia Muro 08/20/2017, 8:36 AM  Delaney MeigsMaija Tabor Tajha Sammarco, PT [phone]

## 2017-08-20 NOTE — Progress Notes (Signed)
NEUROHOSPITALISTS STROKE TEAM - DAILY PROGRESS NOTE   SUBJECTIVE (INTERVAL HISTORY) Son and daughter in law are at the bedside. He did not get ativan last night. However, he was having stoke-cheyne breathing this morning and mumbles words not following commands. Talked with family and they are considering comfort care measures. Palliative care consulted.   I also d/c ativan PRN, and seroquel to avoid any sedative effects. Na 152, discontinued 3% and salt tablet. Continue TF and add NS @ 25. Had leukocytosis, will check UA and CXR.     OBJECTIVE Lab Results: CBC:  Recent Labs  Lab 08/16/17 2240 08/16/17 2250 08/18/17 0638 08/20/17 0517  WBC 7.3  --  9.7 13.6*  HGB 13.2 13.6 13.4 14.0  HCT 39.8 40.0 40.3 42.4  MCV 92.3  --  92.2 94.0  PLT 245  --  231 234   BMP: Recent Labs  Lab 08/16/17 2240 08/16/17 2250  08/18/17 0528 08/18/17 1610 08/18/17 2137 08/19/17 0318 08/19/17 0915 08/19/17 1515 08/19/17 2134 08/20/17 0517  NA 136 142   < > 145 146* 147*  146* 148* 146* 150* 151* 152*  K 3.5 3.7  --  3.0* 3.2*  --   --   --   --   --  3.3*  CL 103 104  --  116* 115*  --   --   --   --   --  121*  CO2 25  --   --  22 23  --   --   --   --   --  24  GLUCOSE 80 80  --  110* 126*  --   --   --   --   --  175*  BUN 15 18  --  9 9  --   --   --   --   --  29*  CREATININE 1.16 1.10  --  0.79 0.81  --   --   --   --   --  0.84  CALCIUM 8.8*  --   --  8.5* 8.9  --   --   --   --   --  9.3  MG  --   --   --  1.9 2.0 2.2 2.1  --   --   --   --   PHOS  --   --   --  2.4* 2.5 2.6 2.6  --   --   --   --    < > = values in this interval not displayed.   Liver Function Tests:  Recent Labs  Lab 08/16/17 2240  AST 23  ALT 27  ALKPHOS 74  BILITOT 0.9  PROT 6.7  ALBUMIN 3.8   No results for input(s): APTT, INR in the last 72 hours. Urine Drug Screen:     Component Value Date/Time   LABOPIA NONE DETECTED 02/13/2017 0908   COCAINSCRNUR NONE DETECTED 02/13/2017 0908   LABBENZ NONE DETECTED 02/13/2017 0908   AMPHETMU NONE DETECTED 02/13/2017 0908   THCU NONE DETECTED 02/13/2017 0908   LABBARB NONE DETECTED 02/13/2017 0908    PHYSICAL EXAM Temp:  [98.5 F (36.9 C)-99.4 F (37.4 C)] 99.3 F (37.4 C) (01/14 1200) Pulse Rate:  [76-102] 91 (01/14 1300) Resp:  [20-39] 30 (01/14 1300) BP: (128-173)/(41-132) 139/68 (01/14 1300) SpO2:  [92 %-99 %] 93 % (01/14 1300) Weight:  [171 lb 8.3 oz (77.8 kg)] 171 lb 8.3 oz (77.8 kg) (01/14 0500) General - Well nourished,  well developed elderly Caucasian male, stoke-cheyne breathing HEENT-  Normocephalic, Normal external eye/conjunctiva.  Normal external ears. Normal external nose, mucus membranes and septum.   Cardiovascular - Regular rate and rhythm  Respiratory - Lungs clear bilaterally. No wheezing. Abdomen - soft and non-tender, BS normal Extremities- no edema or cyanosis Neurological Exam : Mental status: eyes closed, not open to voice or pain, not answering orientation questions. Speech is severely dysarthric.  Cranial nerves: Pupils equal round reactive to light but against eye opening with me. No significant facial drop on exam. Motor exam: b/l upper extremity moving equally, against gravity, not following commands, in mittens bilaterally, 3/5 left lower extremity, 4/5 right lower extremities on pain stimulation. DTR 1+ and not cooperative on babinski testing. Sensory and coordination and gait not tested  IMAGING: I have personally reviewed the radiological images below and agree with the radiology interpretations. Ct Head Wo Contrast Result Date: 08/17/2017 IMPRESSION: 1. Decreased right hydrocephalus and improved right to left midline shift, now 1 mm previously 9 mm. Relatively unchanged large right temporal intraparenchymal hemorrhage and tiny parafalcine subdural hematoma. Small amount of left intraventricular hemorrhage now noted. 2. Atrophy, chronic small-vessel  white matter ischemic changes and remote infarcts.   Ct Head Code Stroke Wo Contrast Result Date: 08/16/2017 IMPRESSION: 1. Intraparenchymal hematoma in the anterior right temporal lobe with volume of 11 cubic cm with associated intraventricular extension filling the right lateral ventricle temporal and occipital horns. 2. Leftward midline shift measuring 5-9 mm with early/impending subfalcine herniation of the anterior cingulate gyrus.   Echocardiogram:                                               Study Conclusions - Left ventricle: The cavity size was normal. Systolic function was   normal. The estimated ejection fraction was in the range of 55%   to 60%. - Aortic valve: There was trivial regurgitation. - Mitral valve: Calcified annulus. Mildly thickened leaflets .   There was mild regurgitation.  Ct Head Wo Contrast  Result Date: 08/17/2017 CLINICAL DATA:  Follow-up hemorrhage. History of amyloid angiopathy. EXAM: CT HEAD WITHOUT CONTRAST TECHNIQUE: Contiguous axial images were obtained from the base of the skull through the vertex without intravenous contrast. COMPARISON:  CT HEAD August 17, 2017 at 1054 hours FINDINGS: BRAIN: Evolving RIGHT inferior basal ganglia/temporal lobe intraparenchymal hematoma with intraventricular extension with increasing vasogenic edema. Similar RIGHT greater than LEFT lateral ventricle dependent blood products. No hydrocephalus. 3 mm RIGHT to LEFT midline shift. Dense RIGHT frontal, RIGHT temporal and RIGHT parietal temporal lobe micro hemorrhages. LEFT parietal lobe and RIGHT parietoccipital lobe encephalomalacia. Patchy to confluent supratentorial white matter hypodensities. Old LEFT basal ganglia infarct. Old small cerebellar infarcts. 2 mm parafalcine acute subdural hematoma similar to decreased from prior CT. Basal cisterns are patent. VASCULAR: Moderate calcific atherosclerosis of the carotid siphons. SKULL: No skull fracture. No significant scalp soft  tissue swelling. SINUSES/ORBITS: The mastoid air-cells and included paranasal sinuses are well-aerated.The included ocular globes and orbital contents are non-suspicious. OTHER: None. IMPRESSION: 1. Evolving RIGHT basal ganglia/temporal lobe hemorrhage with intraventricular extension. Similar 3 mm RIGHT to LEFT midline shift. No hydrocephalus. 2. Acute trace parafalcine subdural hematoma. Evolving acute RIGHT cerebral micro hemorrhages seen with amyloid angiopathy. 3. Multiple old large vascular territory and small vessel infarcts. Electronically Signed   By: Michel Santee.D.  On: 08/17/2017 22:49   Ct Head Wo Contrast  Result Date: 08/17/2017 CLINICAL DATA:  84 year old male-follow-up intracranial hemorrhage. EXAM: CT HEAD WITHOUT CONTRAST TECHNIQUE: Contiguous axial images were obtained from the base of the skull through the vertex without intravenous contrast. COMPARISON:  08/16/2017 CT and prior studies FINDINGS: Brain: There has been little interval change of right temporal intraparenchymal hematoma with maximal transverse diameter of 2.8 cm (3:15). A large amount of right intraventricular hemorrhage is again identified. A small amount of hemorrhage within the left occipital horn is now noted. A tiny parafalcine subdural hematoma measuring 1 mm is unchanged. Right lateral ventricle size has decreased. Fullness of the left lateral ventricle is again noted. Right to left midline shift now measures 1 mm, previously 9 mm. Atrophy, chronic small-vessel white matter ischemic changes and remote left frontoparietal, right MCA and left cerebellar infarcts again noted. Vascular: Atherosclerotic calcifications again noted. Skull: No acute abnormality. Sinuses/Orbits: No acute abnormality Other: None IMPRESSION: 1. Decreased right hydrocephalus and improved right to left midline shift, now 1 mm previously 9 mm. Relatively unchanged large right temporal intraparenchymal hemorrhage and tiny parafalcine subdural  hematoma. Small amount of left intraventricular hemorrhage now noted. 2. Atrophy, chronic small-vessel white matter ischemic changes and remote infarcts. Electronically Signed   By: Harmon Pier M.D.   On: 08/17/2017 11:11   Ct Head Wo Contrast  Result Date: 08/19/2017 CLINICAL DATA:  84 y/o  M; follow-up of intracranial hemorrhage. EXAM: CT HEAD WITHOUT CONTRAST TECHNIQUE: Contiguous axial images were obtained from the base of the skull through the vertex without intravenous contrast. COMPARISON:  08/17/2017 CT head.  02/13/2017 MRI head. FINDINGS: Brain: Stable hematoma centered in right basal ganglia extending into the right lateral ventricle atrium temporal horn. Stable small volume of hemorrhage in the left atria. Stable edema surrounding the hematoma and associated mass effect with 3 mm right-to-left midline shift. No new acute hemorrhage or infarct identified. Stable foci of encephalomalacia in the left parietal and right occipital lobes. Vascular: Calcific atherosclerosis of carotid siphons. Skull: Normal. Negative for fracture or focal lesion. Sinuses/Orbits: Opacification of left mastoid tip and mild paranasal sinus mucosal thickening. Otherwise negative. Other: None. IMPRESSION: Stable hematoma in right basal ganglia and intraventricular hemorrhage in the right greater than left lateral ventricles. Stable associated edema and mass effect with 3 mm right-to-left midline shift. No new acute intracranial abnormality identified. Electronically Signed   By: Mitzi Hansen M.D.   On: 08/19/2017 13:07         IMPRESSION: [name] is a 84 y.o. male with PMH of atrial fibrillation on Eliquis presented for evaluation of acute onset of left-sided weakness, left facial droop, slurred speech and rightward gaze deviation. Examination revealed an NIH stroke scale of 18. CT scan shows right temporal lobe ICH with IVH.   Large Right temporal intraparenchymal hemorrhage and tiny  parafalcine subdural hematoma.  Suspected Etiology: Likely HTN hemorrhage  in the setting of underlying coagulopathy by Eliquis. Resultant Symptoms: fixed rightward gaze, left upper extremity plegia, left lower extremity paresis, dense sensory loss and neglect of the left side. Stroke Risk Factors: atrial fibrillation, hyperlipidemia and hypertension Other Stroke Risk Factors: Advanced age, Hx stroke  Outstanding Stroke Work-up Studies:     MRI brain  08/20/2017 ASSESSMENT:   Neuro exam stable with left sided weakness. Patient hallucinating on exam. Likely undiagnosed dementia at baseline. MRI pending. CCM consulted for medical issues. 3% in progress with femoral line. PICC line ordered so that patient  may participate in therapies once medically stable.   PLAN  08/20/2017: HOLD ASA until 24 hour post tPA neuroimaging is stable & without evidence of bleeding Will add statin in necessary Frequent neuro checks Telemetry monitoring PT/OT/SLP Consult PM & Rehab Consult Case Management /MSW Ongoing aggressive stroke risk factor management Patient's family counseled to be compliant with his antithrombotic medications Patient's family counseled on Lifestyle modifications including, Diet, Exercise, and Stress Follow up with [facility] in 6 weeks  HX OF STROKES: remote left frontoparietal, right MCA and left cerebellar infarcts  DYSPHAGIA: Passed SLP swallow evaluation Aspiration Precautions in progress  CEREBRAL EDEMA: 3% NS Infusion per order set. Sodium goal 150-155. Na Levels every 6 hrs No need for Neurosurgery Consultation at this time High risk for respiratory compromise & progression of cerebral edema next 2-3 days. Peak swelling from strokes is usually 3-5 days. Considerstatneurosurgical consultation for hemicraniectomy for mentation worsens or if there is any imaging evidence of herniation  AFIB, CHRONIC: Hx of atrial fibrillation on Eliquis last dose taken  the morning of 08/16/2017 reversal of eliquis with K-centra per pharmacy protocol and d/c Eliquis Rate control -will appreciate PCCM consult for medical management assistance while in ICU  MEDICAL ISSUES: Hypertensive Encephalopathy B/P's stable overnight Will continue to monitor closely  AKI IVF's in progress Repeat labs in AM  Anemia on Admission Stable this AM Repeat labs in AM Will continue to monitor  Hypo Calcium Replacement in progress Repeat Labs in AM  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 Home Meds: NONE  HYPERLIPIDEMIA:    Component Value Date/Time   CHOL 197 08/18/2017 2137   TRIG 163 (H) 08/20/2017 0517   HDL 61 08/18/2017 2137   CHOLHDL 3.2 08/18/2017 2137   VLDL 26 08/18/2017 2137   LDLCALC 110 (H) 08/18/2017 2137  -  PENDING Home Meds:  NONE LDL  goal < 70 Will start on statin if needed Continue statin at discharge, if needed  DIABETES: Lab Results  Component Value Date   HGBA1C 5.8 (H) 08/18/2017   - PENDING Recent Labs  Lab 08/19/17 1947 08/20/17 0020 08/20/17 0359 08/20/17 0815 08/20/17 1126  GLUCAP 152* 138* 154* 158* 124*  HgbA1c goal < 7.0  Other Active Problems: Principal Problem:   Hemorrhage of right temporal lobe (HCC) Active Problems:   Hydrocephalus   Intraventricular hemorrhage (HCC)   Subdural hematoma (HCC)   Dysarthria due to acute stroke (HCC)   Bacteriuria with pyuria    Hospital day # 4 VTE prophylaxis:SCD's  Diet : Diet NPO time specified   FAMILY UPDATES: No family at bedside  TEAM UPDATES: Marvel Plan, MD STATUS:  DNR     Prior Home Stroke Medications:   aspirin 81 mg daily and Eliquis (apixaban) daily  Discharge Stroke Meds:  Please discharge patient on TBD   Disposition: 01-Home or Self Care Therapy Recs:               PENDING Home Equipment:         PENDING Follow Up:  Follow-up Information    Micki Riley, MD. Schedule an appointment as soon as possible for a visit in 6 week(s).  Specialties:  Neurology, Radiology Contact information: [address] [place] [address] [phone]          [facility] Follow up in 1-2 weeks       Attending note:   I have personally examined this patient, reviewed notes, independently viewed imaging studies, participated in medical decision making and plan of care. ROS completed by me personally and pertinent positives fully documented  I have made any additions or clarifications directly to the above note. Agree with note above.    84 year old male with history of stroke without residual, A. fib on Eliquis admitted for left-sided weakness headache and right-sided gaze, left-sided neglect.  CT showed right basal ganglia and temporal lobe ICH with IVH, as well as chronic left MCA infarct.  Status post Kcentra reversed.  Repeat CT x 2 showed stable hemorrhage and midline shift as well as IVH.  MRI not able to be done due to agitation.  EF 55-60%.  LDL 110 and A1c 5.8.  He still had intermittent agitation, but not requiring Ativan. Given seroquel last night 50mg .  Still drowsy sleepy during round, but also developed stoke-cheyne breathing.  Na 152, d/c 3% saline and salt tablet.  Still on low dose Cleviprex for BP control, add BP po meds and taper off cleviprex as able.  On TF.  Leukocytosis today and will check CXR and UA. Continue Rocephin.   I had long discussion with son at bedside, updated pt current condition, treatment plan and potential poor prognosis. He expressed understanding and appreciation. He is going to discuss with his sisters regarding further  plan. We have called palliative care consult also.    Marvel PlanJindong Iyonnah Ferrante, MD PhD Stroke Neurology 08/20/2017 1:27 PM  This patient is critically ill and at significant risk of neurological worsening, death and care requires constant monitoring of vital signs, hemodynamics,respiratory and cardiac monitoring, extensive review of multiple databases, frequent neurological assessment, discussion with family, other specialists and medical decision making of high complexity.I have made any additions or clarifications directly to the above note. I had long discussion with son and daughter in law at bedside, updated pt current condition, treatment plan and potential prognosis. They expressed understanding and appreciation. I spent 40 minutes of neurocritical care time  in the care of  this patient.      To contact Stroke Continuity provider, please refer to [email or web address]. After hours, contact General Neurology

## 2017-08-20 NOTE — Progress Notes (Signed)
OT Cancellation    08/20/17 1000  OT Visit Information  Last OT Received On 08/20/17  Reason Eval/Treat Not Completed Patient not medically ready. Per RN not medically appropriate. Will return as schedule allows and pt medically appropriate. Thank you.   Michaelangelo Mittelman MSOT, OTR/L Acute Rehab Pager: [phone] Office: [phone]

## 2017-08-20 NOTE — Progress Notes (Signed)
SLP Cancellation Note  Patient Details Name: [name] MRN: [id number] DOB: [birth year]   Cancelled treatment:       Reason Eval/Treat Not Completed: Medical issues which prohibited therapy. Per RN, patient not alert/responsive enough for po trials.   Ferdinand LangoLeah Revonda Menter MA, CCC-SLP [phone]    Ferdinand LangoMcCoy Nikolette Reindl Meryl 08/20/2017, 12:02 PM

## 2017-08-21 DIAGNOSIS — Z515 Encounter for palliative care: Secondary | ICD-10-CM

## 2017-08-21 DIAGNOSIS — Z7189 Other specified counseling: Secondary | ICD-10-CM

## 2017-08-21 LAB — BASIC METABOLIC PANEL
ANION GAP: 14 (ref 5–15)
BUN: 33 mg/dL — ABNORMAL HIGH (ref 6–20)
CALCIUM: 7.6 mg/dL — AB (ref 8.9–10.3)
CO2: 22 mmol/L (ref 22–32)
Chloride: 114 mmol/L — ABNORMAL HIGH (ref 101–111)
Creatinine, Ser: 0.72 mg/dL (ref 0.61–1.24)
GLUCOSE: 310 mg/dL — AB (ref 65–99)
POTASSIUM: 2.9 mmol/L — AB (ref 3.5–5.1)
Sodium: 150 mmol/L — ABNORMAL HIGH (ref 135–145)

## 2017-08-21 LAB — CBC
HEMATOCRIT: 42.2 % (ref 39.0–52.0)
Hemoglobin: 13.8 g/dL (ref 13.0–17.0)
MCH: 31.2 pg (ref 26.0–34.0)
MCHC: 32.7 g/dL (ref 30.0–36.0)
MCV: 95.3 fL (ref 78.0–100.0)
Platelets: 233 10*3/uL (ref 150–400)
RBC: 4.43 MIL/uL (ref 4.22–5.81)
RDW: 14.7 % (ref 11.5–15.5)
WBC: 13.4 10*3/uL — AB (ref 4.0–10.5)

## 2017-08-21 LAB — GLUCOSE, CAPILLARY
GLUCOSE-CAPILLARY: 106 mg/dL — AB (ref 65–99)
GLUCOSE-CAPILLARY: 113 mg/dL — AB (ref 65–99)
GLUCOSE-CAPILLARY: 118 mg/dL — AB (ref 65–99)
GLUCOSE-CAPILLARY: 94 mg/dL (ref 65–99)
Glucose-Capillary: 136 mg/dL — ABNORMAL HIGH (ref 65–99)
Glucose-Capillary: 111 mg/dL — ABNORMAL HIGH (ref 65–99)
Glucose-Capillary: 129 mg/dL — ABNORMAL HIGH (ref 65–99)

## 2017-08-21 LAB — FOLATE RBC
FOLATE, HEMOLYSATE: 351.3 ng/mL
FOLATE, RBC: 896 ng/mL (ref >498)
HEMATOCRIT: 39.2 % (ref 37.5–51.0)

## 2017-08-21 MED ORDER — POTASSIUM CHLORIDE 10 MEQ/50ML IV SOLN
10.0000 meq | INTRAVENOUS | Status: AC
  Administered 2017-08-21 (×2): 10 meq via INTRAVENOUS
  Filled 2017-08-21 (×4): qty 50

## 2017-08-21 MED ORDER — HALOPERIDOL LACTATE 5 MG/ML IJ SOLN
2.0000 mg | Freq: Four times a day (QID) | INTRAMUSCULAR | Status: DC | PRN
Start: 2017-08-21 — End: 2017-08-22
  Administered 2017-08-21 – 2017-08-22 (×4): 2 mg via INTRAVENOUS
  Filled 2017-08-21 (×4): qty 1

## 2017-08-21 MED ORDER — PANCRELIPASE (LIP-PROT-AMYL) 12000-38000 UNITS PO CPEP
24000.0000 [IU] | ORAL_CAPSULE | Freq: Once | ORAL | Status: AC
  Administered 2017-08-21: 24000 [IU] via ORAL
  Filled 2017-08-21: qty 2

## 2017-08-21 MED ORDER — PANTOPRAZOLE SODIUM 40 MG PO TBEC
40.0000 mg | DELAYED_RELEASE_TABLET | Freq: Every day | ORAL | Status: DC
  Administered 2017-08-22 – 2017-08-24 (×2): 40 mg via ORAL
  Filled 2017-08-21 (×3): qty 1

## 2017-08-21 MED ORDER — LIDOCAINE 5 % EX PTCH
1.0000 | MEDICATED_PATCH | CUTANEOUS | Status: DC
Start: 2017-08-21 — End: 2017-08-25
  Administered 2017-08-21 – 2017-08-25 (×5): 1 via TRANSDERMAL
  Filled 2017-08-21 (×5): qty 1

## 2017-08-21 MED ORDER — POTASSIUM CHLORIDE 10 MEQ/50ML IV SOLN
10.0000 meq | INTRAVENOUS | Status: AC
  Administered 2017-08-21 (×4): 10 meq via INTRAVENOUS
  Filled 2017-08-21 (×5): qty 50

## 2017-08-21 MED ORDER — LABETALOL HCL 5 MG/ML IV SOLN: 10.0000 mg | INTRAVENOUS | Status: DC | PRN

## 2017-08-21 MED ORDER — MORPHINE SULFATE (PF) 2 MG/ML IV SOLN: 1.0000 mg | INTRAVENOUS | Status: DC | PRN

## 2017-08-21 MED ORDER — SODIUM BICARBONATE 650 MG PO TABS
650.0000 mg | ORAL_TABLET | Freq: Once | ORAL | Status: AC
  Administered 2017-08-21: 650 mg via ORAL
  Filled 2017-08-21: qty 1

## 2017-08-21 MED ORDER — KETOROLAC TROMETHAMINE 15 MG/ML IJ SOLN
15.0000 mg | Freq: Four times a day (QID) | INTRAMUSCULAR | Status: DC | PRN
  Administered 2017-08-21 (×2): 15 mg via INTRAVENOUS
  Filled 2017-08-21 (×2): qty 1

## 2017-08-21 NOTE — Progress Notes (Signed)
Pt's feeding tube clogged during night shift.  Unclogging protocol attempted but was unsuccessful.  MD notified.  Will hold meds for now.  Cortrex team will be available to attempt to unclog line 1/16.  Pt is becoming more restless and attempting to pull at lines and tubes.  MD notified, PRN order and restraints ordered.  Will continue to monitor.

## 2017-08-21 NOTE — Progress Notes (Signed)
NEUROHOSPITALISTS STROKE TEAM - DAILY PROGRESS NOTE   SUBJECTIVE (INTERVAL HISTORY) Daughters are at the bedside. Pt seems a little more awake alert, open eyes briefly on voice. Severe dysarthria, able to greet me verbally but intelligible. Able to tell me his last name and one of his daughters name, but not know his age or place or situation. cortrak clogged and IVF increased. Still has mild agitation after working with PT/OT, on restraint. Breathing better than yesterday but still has obstructive apnea. Palliative consulted. Daughters and son are very reasonable.     OBJECTIVE Lab Results: CBC:  Recent Labs  Lab 08/18/17 0638 08/20/17 0517 08/21/17 0500  WBC 9.7 13.6* 13.4*  HGB 13.4 14.0 13.8  HCT 40.3 42.4 42.2  MCV 92.2 94.0 95.3  PLT 231 234 233   BMP: Recent Labs  Lab 08/16/17 2240 08/16/17 2250  08/18/17 0528 08/18/17 1610 08/18/17 2137 08/19/17 0318 08/19/17 0915 08/19/17 1515 08/19/17 2134 08/20/17 0517 08/21/17 0500  NA 136 142   < > 145 146* 147*  146* 148* 146* 150* 151* 152* 150*  K 3.5 3.7  --  3.0* 3.2*  --   --   --   --   --  3.3* 2.9*  CL 103 104  --  116* 115*  --   --   --   --   --  121* 114*  CO2 25  --   --  22 23  --   --   --   --   --  24 22  GLUCOSE 80 80  --  110* 126*  --   --   --   --   --  175* 310*  BUN 15 18  --  9 9  --   --   --   --   --  29* 33*  CREATININE 1.16 1.10  --  0.79 0.81  --   --   --   --   --  0.84 0.72  CALCIUM 8.8*  --   --  8.5* 8.9  --   --   --   --   --  9.3 7.6*  MG  --   --   --  1.9 2.0 2.2 2.1  --   --   --   --   --   PHOS  --   --   --  2.4* 2.5 2.6 2.6  --   --   --   --   --    < > = values in this interval not displayed.   Liver Function Tests:  Recent Labs  Lab 08/16/17 2240  AST 23  ALT 27  ALKPHOS 74  BILITOT 0.9  PROT 6.7  ALBUMIN 3.8   No results for input(s): APTT, INR in the last 72 hours. Urine Drug Screen:     Component Value  Date/Time   LABOPIA NONE DETECTED 02/13/2017 0908   COCAINSCRNUR NONE DETECTED 02/13/2017 0908   LABBENZ NONE DETECTED 02/13/2017 0908   AMPHETMU NONE DETECTED 02/13/2017 0908   THCU NONE DETECTED 02/13/2017 0908   LABBARB NONE DETECTED 02/13/2017 0908    PHYSICAL EXAM Temp:  [98.7 F (37.1 C)-100.7 F (38.2 C)] 100.4 F (38 C) (01/15 0800) Pulse Rate:  [86-130] 86 (01/15 1200) Resp:  [18-42] 24 (01/15 1200) BP: (107-179)/(60-117) 154/72 (01/15 1200) SpO2:  [88 %-99 %] 96 % (01/15 1200) Weight:  [170 lb 6.7 oz (77.3 kg)] 170 lb 6.7 oz (77.3 kg) (  01/15 0500) General - Well nourished, well developed elderly Caucasian male, not in acute distress, breathing improved from yesterday but still has obstructive sleep apnea  HEENT-  Normocephalic, Normal external eye/conjunctiva.  Normal external ears. Normal external nose, mucus membranes and septum.   Cardiovascular - Regular rate and rhythm  Neurological Exam : Mental status: eyes closed, briefly open to voice, orientated to self and people but not to time or place. Severe dysarthria with intelligible words..  Cranial nerves: Pupils equal round reactive to light but against eye opening with me. No significant facial drop on exam. Motor exam: b/l upper extremity moving equally, against gravity, not following commands, in mittens bilaterally, 3/5 left lower extremity, 4/5 right lower extremities on pain stimulation. DTR 1+ and not cooperative on babinski testing. Sensory and coordination and gait not tested  IMAGING: I have personally reviewed the radiological images below and agree with the radiology interpretations. Ct Head Wo Contrast Result Date: 08/17/2017 IMPRESSION: 1. Decreased right hydrocephalus and improved right to left midline shift, now 1 mm previously 9 mm. Relatively unchanged large right temporal intraparenchymal hemorrhage and tiny parafalcine subdural hematoma. Small amount of left intraventricular hemorrhage now noted. 2.  Atrophy, chronic small-vessel white matter ischemic changes and remote infarcts.   Ct Head Code Stroke Wo Contrast Result Date: 08/16/2017 IMPRESSION: 1. Intraparenchymal hematoma in the anterior right temporal lobe with volume of 11 cubic cm with associated intraventricular extension filling the right lateral ventricle temporal and occipital horns. 2. Leftward midline shift measuring 5-9 mm with early/impending subfalcine herniation of the anterior cingulate gyrus.   Echocardiogram:                                               Study Conclusions - Left ventricle: The cavity size was normal. Systolic function was   normal. The estimated ejection fraction was in the range of 55%   to 60%. - Aortic valve: There was trivial regurgitation. - Mitral valve: Calcified annulus. Mildly thickened leaflets .   There was mild regurgitation.  Ct Head Wo Contrast  Result Date: 08/17/2017 CLINICAL DATA:  Follow-up hemorrhage. History of amyloid angiopathy. EXAM: CT HEAD WITHOUT CONTRAST TECHNIQUE: Contiguous axial images were obtained from the base of the skull through the vertex without intravenous contrast. COMPARISON:  CT HEAD August 17, 2017 at 1054 hours FINDINGS: BRAIN: Evolving RIGHT inferior basal ganglia/temporal lobe intraparenchymal hematoma with intraventricular extension with increasing vasogenic edema. Similar RIGHT greater than LEFT lateral ventricle dependent blood products. No hydrocephalus. 3 mm RIGHT to LEFT midline shift. Dense RIGHT frontal, RIGHT temporal and RIGHT parietal temporal lobe micro hemorrhages. LEFT parietal lobe and RIGHT parietoccipital lobe encephalomalacia. Patchy to confluent supratentorial white matter hypodensities. Old LEFT basal ganglia infarct. Old small cerebellar infarcts. 2 mm parafalcine acute subdural hematoma similar to decreased from prior CT. Basal cisterns are patent. VASCULAR: Moderate calcific atherosclerosis of the carotid siphons. SKULL: No skull fracture.  No significant scalp soft tissue swelling. SINUSES/ORBITS: The mastoid air-cells and included paranasal sinuses are well-aerated.The included ocular globes and orbital contents are non-suspicious. OTHER: None. IMPRESSION: 1. Evolving RIGHT basal ganglia/temporal lobe hemorrhage with intraventricular extension. Similar 3 mm RIGHT to LEFT midline shift. No hydrocephalus. 2. Acute trace parafalcine subdural hematoma. Evolving acute RIGHT cerebral micro hemorrhages seen with amyloid angiopathy. 3. Multiple old large vascular territory and small vessel infarcts. Electronically Signed  By: Awilda Metro M.D.   On: 08/17/2017 22:49   Ct Head Wo Contrast  Result Date: 08/17/2017 CLINICAL DATA:  84 year old male-follow-up intracranial hemorrhage. EXAM: CT HEAD WITHOUT CONTRAST TECHNIQUE: Contiguous axial images were obtained from the base of the skull through the vertex without intravenous contrast. COMPARISON:  08/16/2017 CT and prior studies FINDINGS: Brain: There has been little interval change of right temporal intraparenchymal hematoma with maximal transverse diameter of 2.8 cm (3:15). A large amount of right intraventricular hemorrhage is again identified. A small amount of hemorrhage within the left occipital horn is now noted. A tiny parafalcine subdural hematoma measuring 1 mm is unchanged. Right lateral ventricle size has decreased. Fullness of the left lateral ventricle is again noted. Right to left midline shift now measures 1 mm, previously 9 mm. Atrophy, chronic small-vessel white matter ischemic changes and remote left frontoparietal, right MCA and left cerebellar infarcts again noted. Vascular: Atherosclerotic calcifications again noted. Skull: No acute abnormality. Sinuses/Orbits: No acute abnormality Other: None IMPRESSION: 1. Decreased right hydrocephalus and improved right to left midline shift, now 1 mm previously 9 mm. Relatively unchanged large right temporal intraparenchymal hemorrhage and  tiny parafalcine subdural hematoma. Small amount of left intraventricular hemorrhage now noted. 2. Atrophy, chronic small-vessel white matter ischemic changes and remote infarcts. Electronically Signed   By: Harmon Pier M.D.   On: 08/17/2017 11:11   Ct Head Wo Contrast  Result Date: 08/19/2017 CLINICAL DATA:  84 y/o  M; follow-up of intracranial hemorrhage. EXAM: CT HEAD WITHOUT CONTRAST TECHNIQUE: Contiguous axial images were obtained from the base of the skull through the vertex without intravenous contrast. COMPARISON:  08/17/2017 CT head.  02/13/2017 MRI head. FINDINGS: Brain: Stable hematoma centered in right basal ganglia extending into the right lateral ventricle atrium temporal horn. Stable small volume of hemorrhage in the left atria. Stable edema surrounding the hematoma and associated mass effect with 3 mm right-to-left midline shift. No new acute hemorrhage or infarct identified. Stable foci of encephalomalacia in the left parietal and right occipital lobes. Vascular: Calcific atherosclerosis of carotid siphons. Skull: Normal. Negative for fracture or focal lesion. Sinuses/Orbits: Opacification of left mastoid tip and mild paranasal sinus mucosal thickening. Otherwise negative. Other: None. IMPRESSION: Stable hematoma in right basal ganglia and intraventricular hemorrhage in the right greater than left lateral ventricles. Stable associated edema and mass effect with 3 mm right-to-left midline shift. No new acute intracranial abnormality identified. Electronically Signed   By: Mitzi Hansen M.D.   On: 08/19/2017 13:07         IMPRESSION: Mr. [name] is a 84 y.o. male with PMH of atrial fibrillation on Eliquis presented for evaluation of acute onset of left-sided weakness, left facial droop, slurred speech and rightward gaze deviation. Examination revealed an NIH stroke scale of 18. CT scan shows right temporal lobe ICH with IVH.   Large Right temporal  intraparenchymal hemorrhage and tiny parafalcine subdural hematoma.  Suspected Etiology: Likely HTN hemorrhage  in the setting of underlying coagulopathy by Eliquis. Resultant Symptoms: fixed rightward gaze, left upper extremity plegia, left lower extremity paresis, dense sensory loss and neglect of the left side. Stroke Risk Factors: atrial fibrillation, hyperlipidemia and hypertension Other Stroke Risk Factors: Advanced age, Hx stroke  Outstanding Stroke Work-up Studies:     MRI brain  08/21/2017 ASSESSMENT:   Neuro exam stable with left sided weakness. Patient hallucinating on exam. Likely undiagnosed dementia at baseline. MRI pending. CCM consulted for medical issues. 3% in progress with femoral  line. PICC line ordered so that patient may participate in therapies once medically stable.   PLAN  08/21/2017: HOLD ASA until 24 hour post tPA neuroimaging is stable & without evidence of bleeding Will add statin in necessary Frequent neuro checks Telemetry monitoring PT/OT/SLP Consult PM & Rehab Consult Case Management /MSW Ongoing aggressive stroke risk factor management Patient's family counseled to be compliant with his antithrombotic medications Patient's family counseled on Lifestyle modifications including, Diet, Exercise, and Stress Follow up with [facility] in 6 weeks  HX OF STROKES: remote left frontoparietal, right MCA and left cerebellar infarcts  DYSPHAGIA: Passed SLP swallow evaluation Aspiration Precautions in progress  CEREBRAL EDEMA: 3% NS Infusion per order set. Sodium goal 150-155. Na Levels every 6 hrs No need for Neurosurgery Consultation at this time High risk for respiratory compromise & progression of cerebral edema next 2-3 days. Peak swelling from strokes is usually 3-5 days. Considerstatneurosurgical consultation for hemicraniectomy for mentation worsens or if there is any imaging evidence of herniation  AFIB, CHRONIC: Hx of atrial  fibrillation on Eliquis last dose taken the morning of 08/16/2017 reversal of eliquis with K-centra per pharmacy protocol and d/c Eliquis Rate control -will appreciate PCCM consult for medical management assistance while in ICU  MEDICAL ISSUES: Hypertensive Encephalopathy B/P's stable overnight Will continue to monitor closely  AKI IVF's in progress Repeat labs in AM  Anemia on Admission Stable this AM Repeat labs in AM Will continue to monitor  Hypo Calcium Replacement in progress Repeat Labs in AM  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 Home Meds: NONE  HYPERLIPIDEMIA:    Component Value Date/Time   CHOL 197 08/18/2017 2137   TRIG 163 (H) 08/20/2017 0517   HDL 61 08/18/2017 2137   CHOLHDL 3.2 08/18/2017 2137   VLDL 26 08/18/2017 2137   LDLCALC 110 (H) 08/18/2017 2137  -  PENDING Home Meds:  NONE LDL  goal < 70 Will start on statin if needed Continue statin at discharge, if needed  DIABETES: Lab Results  Component Value Date   HGBA1C 5.8 (H) 08/18/2017   - PENDING Recent Labs  Lab 08/20/17 2051 08/20/17 2312 08/21/17 0343 08/21/17 0812 08/21/17 1203  GLUCAP 150* 111* 118* 113* 129*  HgbA1c goal < 7.0  Other Active Problems: Principal Problem:   Hemorrhage of right temporal lobe (HCC) Active Problems:   Hydrocephalus   Intraventricular hemorrhage (HCC)   Subdural hematoma (HCC)   Dysarthria due to acute stroke (HCC)   Bacteriuria with pyuria    Hospital day # 5 VTE prophylaxis:SCD's  Diet : Diet NPO time specified   FAMILY UPDATES: No family at bedside  TEAM UPDATES: Marvel Plan, MD STATUS:  DNR      Prior Home Stroke Medications:  aspirin 81 mg daily and Eliquis (apixaban) daily  Discharge Stroke Meds:  Please discharge patient on TBD   Disposition: 01-Home or Self Care Therapy Recs:               PENDING Home Equipment:         PENDING Follow Up:  Follow-up Information    Micki Riley, MD. Schedule an appointment as soon as possible for  a visit in 6 week(s).   Specialties:  Neurology, Radiology Contact information: [address] [place] [address] [phone]          [facility] Follow up in 1-2 weeks       Attending note:   I have personally examined this patient, reviewed notes, independently viewed imaging studies, participated in medical decision making and plan of care. ROS completed by me personally and pertinent positives fully documented  I have made any additions or clarifications directly to the above note. Agree with note above.    84 year old male with history of stroke without residual, A. fib on Eliquis admitted for left-sided weakness headache and right-sided gaze, left-sided neglect.  CT showed right basal ganglia and temporal lobe ICH with IVH, as well as chronic left MCA infarct.  Status post Kcentra reversed.  Repeat CT x 2 showed stable hemorrhage and midline shift as well as IVH.  MRI not able to be done due to agitation.  EF 55-60%.  LDL 110 and A1c 5.8.  He a little more awake alert but still had intermittent agitation, on restrain.  Na 150, off 3% saline and salt tablet.  cortrak clogged on IVF. K low with supplement via PICC line.   Leukocytosis stable today. CXR and UA unremarkable. Continue Rocephin. Palliative care on board. Need cortrak revision tomorrow.  I had long discussion with daughters at bedside, updated pt current condition, treatment plan and potential poor prognosis. They are very reasonable and agree with comfort care if declines.    Marvel Plan, MD PhD Stroke Neurology 08/21/2017 12:48  PM  This patient is critically ill and at significant risk of neurological worsening, death and care requires constant monitoring of vital signs, hemodynamics,respiratory and cardiac monitoring, extensive review of multiple databases, frequent neurological assessment, discussion with family, other specialists and medical decision making of high complexity.I have made any additions or clarifications directly to the above note. I had long discussion with daughters at bedside, updated pt current condition, treatment plan and potential prognosis. They expressed understanding and appreciation. I spent 40 minutes of neurocritical care time  in the care of  this patient.      To contact Stroke Continuity provider, please refer to [email or web address]. After hours, contact General Neurology

## 2017-08-21 NOTE — Consult Note (Signed)
Consultation Note Date: 08/21/2017   Patient Name: [name]  DOB: [birth year]  MRN: [id number]  Age / Sex: 84 y.o., male  PCP: [facility] [place] Referring Physician: Rosalin Hawking, MD  Reason for Consultation: Establishing goals of care  HPI/Patient Profile: 84 y.o. male  with past medical history of a fib on Eliquis, CVA 02/2017,  admitted on 08/16/2017 with L facial droop, L sided weakness, found to have large R temporal hematoma with intraventricular hemorrhage. Most recent CT on 1/13 showed 36m R to left midline shift. Has had some changing breathing patterns that have been concerning, periods of apnea- chest xray is clear. Has NG tube in place- currently clogged- plan for revision tomorrow. Has not completed SLP eval. Showing slight improvements daily in mental status, but overall prognosis currently unknown, could be poor. [place] medicine [place]   Clinical Assessment and Goals of Care:  I have reviewed medical records including EPIC notes, labs and imaging, assessed the patient and then met at the bedside along with patient's HCPOA- MSharyn Lull daughter LLaretta Alstrom son- TKonrad Dolores and girlfriend- AWebb Silversmith to discuss diagnosis prognosis, [place] E[place] wishes, disposition and options.  I introduced Palliative Medicine as specialized medical care for people living with serious illness. It focuses on providing relief from the symptoms and stress of a serious illness. The goal is to improve quality of life for both the patient and the family.  We discussed a brief life review of the patient. 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 He completed two cross country motorcycle trips in his 89's   As far as functional and nutritional status - prior to admission he was completely independent. He had driven to [place]  house to dinner before this incident.   We discussed their current illness and what it means in the larger context of their on-going co-morbidities.  Natural disease trajectory and expectations at EOL were discussed.  I attempted to elicit values and goals of care important to the patient.  He has always told his family that if he cannot "get back to himself"- cannot recognize and interact with his family- then he would not want life prolonging measures. He would not want to be artificially fed. However, family does not want him to suffer. They note he has been afraid of dying and don't want him to have anxiety if he were to be heading towards end of life.   The difference between aggressive medical intervention and comfort care was considered in light of the patient's goals of care.   Advanced directives, concepts specific to code status, artifical feeding and hydration, and rehospitalization were considered and discussed. We discussed possibility of residual dysphagia, risk for aspiration, and decisions that may come up regarding treating other comorbidities that may occur including aspiration pneumonia.   Questions and concerns were addressed.  Hard Choices booklet left for review. The family was encouraged to call with questions or concerns.  Primary Decision Maker HCPOA- Silverio Lay (daughter), and Zacharias Ridling (son)    SUMMARY OF RECOMMENDATIONS -Plan to continue current level of care for next few days -If patient were to decline or medical providers were to tell family that there was little chance that he will recover mental status- they would prefer to transition to comfort measures only -Pt needs SLP swallow complete- they would likely not choose long term artificial feeding if meaningful functional recovery not possible    Code Status/Advance Care Planning:  DNR    Symptom Management:   Patient has history of hip pain- will order 15 mg Toradol q 6hr IV prn d/t cortrak  being clogged. Also start lidocaine patches to hip- apply for 12 hr, remove for 12 hr, reapply.  Palliative Prophylaxis:   Aspiration, Delirium Protocol and Frequent Pain Assessment  Additional Recommendations (Limitations, Scope, Preferences):  Full Scope Treatment  Prognosis:    Unable to determine  Discharge Planning: To Be Determined  Primary Diagnoses: Present on Admission: . Subdural hematoma ([place]) . Dysarthria due to acute stroke ([place]) . Bacteriuria with pyuria . Hemorrhage of right temporal lobe ([place])   I have reviewed the medical record, interviewed the patient and family, and examined the patient. The following aspects are pertinent.  Past Medical History:  Diagnosis Date  . A-fib ([place])   . GERD (gastroesophageal reflux disease)   . Hypercholesteremia   . Stroke [facility])    Social History   Socioeconomic History  . Marital status: Widowed    Spouse name: None  . Number of children: None  . Years of education: None  . Highest education level: None  Social Needs  . Financial resource strain: None  . Food insecurity - worry: None  . Food insecurity - inability: None  . Transportation needs - medical: None  . Transportation needs - non-medical: None  Occupational History  . None  Tobacco Use  . Smoking status: Never Smoker  . Smokeless tobacco: Never Used  Substance and Sexual Activity  . Alcohol use: No  . Drug use: No  . Sexual activity: None  Other Topics Concern  . None  Social History Narrative   Lives by himself, independent at baseline   Family History  Problem Relation Age of Onset  . Hypertension Father    Scheduled Meds: .  stroke: mapping our early stages of recovery book   Does not apply Once  . amLODipine  10 mg Oral Daily  . atorvastatin  20 mg Oral QPM  . Chlorhexidine Gluconate Cloth  6 each Topical Daily  . Chlorhexidine Gluconate Cloth  6 each Topical Q0600  . feeding supplement (PRO-STAT SUGAR FREE 64)  30 mL Per Tube Daily    . finasteride  5 mg Oral Daily  . lidocaine  1 patch Transdermal Q24H  . losartan  100 mg Oral Daily  . mouth rinse  15 mL Mouth Rinse BID  . mupirocin ointment  1 application Nasal BID  . pantoprazole  40 mg Oral Daily  . senna-docusate  1 tablet Oral BID  . sertraline  25 mg Oral Daily  . sodium chloride flush  10-40 mL Intracatheter Q12H  . tamsulosin  0.4 mg Oral QHS   Continuous Infusions: . sodium chloride 75 mL/hr at 08/21/17 1319  . cefTRIAXone (ROCEPHIN)  IV Stopped (08/21/17 0250)  . feeding supplement (VITAL 1.5 CAL) Stopped (08/21/17 0700)  . potassium chloride Stopped (08/21/17 1621)   PRN Meds:.acetaminophen **OR** acetaminophen (TYLENOL) oral liquid 160 mg/5  mL **OR** acetaminophen, haloperidol lactate, ketorolac, labetalol, sodium chloride flush Medications Prior to Admission:  Prior to Admission medications   Medication Sig Start Date End Date Taking? Authorizing Provider  apixaban (ELIQUIS) 5 MG TABS tablet Take 1 tablet (5 mg total) by mouth 2 (two) times daily. 03/02/17  Yes Love, Ivan Anchors, PA-C  aspirin EC 81 MG tablet Take 81 mg by mouth daily.   Yes [provider]  atorvastatin (LIPITOR) 40 MG tablet Take 20 mg by mouth every evening.   Yes [provider]  docusate sodium (COLACE) 100 MG capsule Take 1 capsule (100 mg total) by mouth daily as needed for mild constipation. 03/02/17  Yes Love, Ivan Anchors, PA-C  ferrous sulfate 325 (65 FE) MG tablet Take 1 tablet (325 mg total) by mouth daily with breakfast. 03/02/17  Yes Love, Ivan Anchors, PA-C  finasteride (PROSCAR) 5 MG tablet Take 1 tablet (5 mg total) by mouth daily. 03/02/17  Yes Love, Ivan Anchors, PA-C  omeprazole (PRILOSEC) 20 MG capsule Take 20 mg by mouth daily.   Yes [provider]  sertraline (ZOLOFT) 25 MG tablet Take 1 tablet (25 mg total) by mouth daily. 03/02/17  Yes Love, Ivan Anchors, PA-C  tamsulosin (FLOMAX) 0.4 MG CAPS capsule Take 0.4 mg by mouth at bedtime.   Yes [provider]  atorvastatin (LIPITOR) 80 MG tablet Take 1 tablet (80 mg total) by mouth daily. Patient not taking: Reported on 08/17/2017 03/02/17   Love, Ivan Anchors, PA-C  cyclobenzaprine (FLEXERIL) 5 MG tablet Take 1 tablet (5 mg total) by mouth 3 (three) times daily as needed for muscle spasms. Patient not taking: Reported on 08/17/2017 03/02/17   Bary Leriche, PA-C   No Known Allergies Review of Systems  Unable to perform ROS: Mental status change    Physical Exam  Constitutional: He appears well-developed and well-nourished.  Cardiovascular: Normal rate.  Pulmonary/Chest:  tachypneic at times  Abdominal: Soft.  Neurological:  Lethargic, does not follow commands  Skin: Skin is warm and dry.  Psychiatric:  Not talking  Nursing note and vitals reviewed.   Vital Signs: BP (!) 167/71 (BP Location: Left Arm)   Pulse 91   Temp 98.2 F (36.8 C) (Oral)   Resp (!) 22   Ht 5' 11"  (1.803 m)   Wt 77.3 kg (170 lb 6.7 oz)   SpO2 97%   BMI 23.77 kg/m  Pain Assessment: Faces   Pain Score: Asleep   SpO2: SpO2: 97 % O2 Device:SpO2: 97 % O2 Flow Rate: .O2 Flow Rate (L/min): 4 L/min  IO: Intake/output summary:   Intake/Output Summary (Last 24 hours) at 08/21/2017 1642 Last data filed at 08/21/2017 1300 Gross per 24 hour  Intake 2114.08 ml  Output 48364 ml  Net -46249.92 ml    LBM: Last BM Date: 08/20/17 Baseline Weight: Weight: 89 kg (196 lb 3.4 oz) Most recent weight: Weight: 77.3 kg (170 lb 6.7 oz)     Palliative Assessment/Data:     Thank you for this consult. Palliative medicine will continue to follow and assist as needed.   Time In: 1515 Time Out: 1700 Time Total: 105 minutes Prolong services billed: yes Greater than 50%  of this time was spent counseling and coordinating care related to the above assessment and plan.  Signed by: Mariana Kaufman, AGNP-C Palliative Medicine    Please contact Palliative Medicine Team phone at [phone] for questions and concerns.    For individual provider: See Shea Evans

## 2017-08-21 NOTE — Progress Notes (Signed)
SLP Cancellation Note  Patient Details Name: [name] MRN: [id number] DOB: 11-Feb-1934   Cancelled treatment:       Reason Eval/Treat Not Completed: Pt unavailable x 2 attempts.  Will f/u next date.    Blenda MountsCouture, Chestina Komatsu Laurice 08/21/2017, 4:39 PM

## 2017-08-21 NOTE — Progress Notes (Signed)
Occupational Therapy Treatment Patient Details Name: [name] MRN: [id number] DOB: 03-22-1934 Today's Date: 08/21/2017    History of present illness 84 y.o. male admitted on 08/16/17 for left sided weakness.  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 1/12-1/13 respiratory difficulty. Pt with significant PMH of stroke, A-fib, and L THA.   OT comments  Pt progressing towards established OT goals. However, continues to demonstrate poor cognition, balance, and functional use of LUE. Pt requiring Max A for grooming tasks with support in sitting. Pt maintaining sitting at EOB with Max A due to significant posterior lean. Pt requiring Max A +2 for stand pivot transfer to recliner with bil knees blocked. Continue to recommend post-acute OT and will continue to follow acutely to facilitate safe dc.    Follow Up Recommendations  CIR;Supervision/Assistance - 24 hour    Equipment Recommendations  Hospital bed;Wheelchair cushion (measurements OT);Wheelchair (measurements OT)    Recommendations for Other Services Rehab consult    Precautions / Restrictions Precautions Precautions: Fall Precaution Comments: no visual tracking, decreased left sided movement and attention Restrictions Weight Bearing Restrictions: No       Mobility Bed Mobility Overal bed mobility: Needs Assistance Bed Mobility: Supine to Sit     Supine to sit: +2 for physical assistance;Max assist     General bed mobility comments: Two person max assist to get to EOB.  Assist to support trunk and bring both legs over EOB.  HOB 30 degrees. Pt not following commands to assist and significant posterior lean /push throughout  Transfers Overall transfer level: Needs assistance Equipment used: 2 person hand held assist Transfers: Sit to/from [employer] Sit to Stand: Total assist;+2 physical assistance;From  elevated surface Stand pivot transfers: Max assist;+2 physical assistance       General transfer comment: stand x2 from elevated surface with max assist for anterior translation and rise with bil knees blocked and assist of pad and belt. With pivot to left pt slightly moving feet but maintains crouched posture throughout standing    Balance Overall balance assessment: Needs assistance Sitting-balance support: Feet supported;Bilateral upper extremity supported;No upper extremity supported;Single extremity supported Sitting balance-Leahy Scale: Zero Sitting balance - Comments: max to total assist seated EOB. Pt with decreased posterior push with seated assist to pt's right with RUE support on PT leg Postural control: Posterior lean Standing balance support: Bilateral upper extremity supported Standing balance-Leahy Scale: Zero Standing balance comment: 2 person assist with crouched posture                           ADL either performed or assessed with clinical judgement   ADL Overall ADL's : Needs assistance/impaired     Grooming: Maximal assistance;Wash/dry face;Sitting Grooming Details (indicate cue type and reason): Pt performing grooming with supported seating in recliner. Pt with poor following of commands and required Max A to facilitate grooming; good grasp through R hand to hold wash cloth. Pt requiring increased cues to let go of wash cloth once finished.             Lower Body Dressing: Total assistance Lower Body Dressing Details (indicate cue type and reason): Total A for donning socks Toilet Transfer: Maximal assistance;+2 for physical assistance;Stand-pivot(Simulated to recliner) Toilet Transfer Details (indicate cue type and reason): requiring Max A +2 to perform stand pivot. Pt very fearfull and with poor awareness. Continues to state "dont  let me go" throughout transfer         Functional mobility during ADLs: Maximal assistance;+2 for physical  assistance General ADL Comments: Max-Total A thoughout session. Maintaining sitting at EOB with Max A +2 for maintaining upright posture; pt with tendency for posterior pushing.     Vision   Vision Assessment?: Yes;Vision impaired- to be further tested in functional context   Perception     Praxis      Cognition Arousal/Alertness: Lethargic Behavior During Therapy: Restless;Impulsive Overall Cognitive Status: Impaired/Different from baseline Area of Impairment: Orientation;Attention;Memory;Following commands;Safety/judgement;Awareness;Problem solving                 Orientation Level: Disoriented to;Place;Time;Situation Current Attention Level: Focused Memory: Decreased recall of precautions;Decreased short-term memory Following Commands: Follows one step commands inconsistently;Follows one step commands with increased time Safety/Judgement: Decreased awareness of safety;Decreased awareness of deficits Awareness: Intellectual Problem Solving: Slow processing;Decreased initiation;Difficulty sequencing;Requires verbal cues;Requires tactile cues General Comments: pt with significant posterior lean with assist throughout sitting and standing. Pt with only partial eye opening throughout session. Pt able to state name, "I'm supposed to be 84" and otherwise replying "yes maam" or "please don't let me go" Pt with decreased following of commands and required significant amount of time and cues.        Exercises     Shoulder Instructions       General Comments Son and two daughters present throughout session    Pertinent Vitals/ Pain       Pain Assessment: Faces Faces Pain Scale: No hurt Pain Intervention(s): Monitored during session  Home Living                                          Prior Functioning/Environment              Frequency  Min 2X/week        Progress Toward Goals  OT Goals(current goals can now be found in the care plan  section)  Progress towards OT goals: Progressing toward goals  Acute Rehab OT Goals Patient Stated Goal: dtr goal is for pt to return home with family if possible OT Goal Formulation: With family Time For Goal Achievement: 08/31/17 Potential to Achieve Goals: Good ADL Goals Pt Will Perform Grooming: with min assist;sitting Pt Will Perform Upper Body Bathing: with min assist;sitting Pt Will Transfer to Toilet: with mod assist;stand pivot transfer;bedside commode Additional ADL Goal #1: Pt will demonstrate sustained attention during ADL task in nondistrating environemtn with mod redirectional cues  Plan Discharge plan remains appropriate    Co-evaluation    PT/OT/SLP Co-Evaluation/Treatment: Yes Reason for Co-Treatment: Complexity of the patient's impairments (multi-system involvement);For patient/therapist safety PT goals addressed during session: Mobility/safety with mobility;Balance OT goals addressed during session: ADL's and self-care      AM-PAC PT "6 Clicks" Daily Activity     Outcome Measure   Help from another person eating meals?: Total Help from another person taking care of personal grooming?: Total Help from another person toileting, which includes using toliet, bedpan, or urinal?: Total Help from another person bathing (including washing, rinsing, drying)?: Total Help from another person to put on and taking off regular upper body clothing?: Total Help from another person to put on and taking off regular lower body clothing?: Total 6 Click Score: 6    End of Session Equipment Utilized During Treatment: Gait belt  OT Visit Diagnosis: Other abnormalities of gait and mobility (R26.89);Muscle weakness (generalized) (M62.81);Low vision, both eyes (H54.2);Other symptoms and signs involving cognitive function   Activity Tolerance Patient limited by lethargy(limited by cognition)   Patient Left with call bell/phone within reach;in chair;with chair alarm set;with  family/visitor present;with restraints reapplied   Nurse Communication Mobility status;Need for lift equipment        Time: 1017-1055 OT Time Calculation (min): 38 min  Charges: OT General Charges $OT Visit: 1 Visit OT Treatments $Self Care/Home Management : 8-22 mins $Therapeutic Activity: 8-22 mins  Terriah Reggio MSOT, OTR/L Acute Rehab Pager: [phone] Office: [phone]   Theodoro GristCharis M Rathana Viveros 08/21/2017, 1:54 PM

## 2017-08-21 NOTE — Progress Notes (Signed)
Pt admitted to the unit as a transfer from 4N. Pt drowsy and sleeping. Right hand remains in restraint; condom cath on; bed alarm on; call light within reach. Family at bedside; VSS; telemetry applied and verified with CCMD; NT called to second verify. Will closely monitor pt. Dionne BucyP. Amo Dezerae Freiberger RN

## 2017-08-21 NOTE — Progress Notes (Signed)
No charge note:   Palliative consult received.   Meeting arranged for today at 3pm.   Thank you for allowing Palliative Medicine to assist in the care of this patient.  Ocie BobKasie Mahan, AGNP-C Palliative Medicine  Please call Palliative Medicine team phone with any questions [phone]. For individual providers please see [facility].

## 2017-08-21 NOTE — Progress Notes (Signed)
Physical Therapy Treatment Patient Details Name: [name] MRN: [id number] DOB: 1934-08-06 Today's Date: 08/21/2017    History of Present Illness 84 y.o. male admitted on 08/16/17 for left sided weakness.  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 1/12-1/13 respiratory difficulty. Pt with significant PMH of stroke, A-fib, and L THA.    PT Comments    Pt responding to questions for self and stating "please don't let me go" pt with significant posterior lean limiting his mobility along with cognition. Pt with very strong right UE and benefits from engagement of Right hand at all times to prevent pushing and grabbing lines. Family present throughout session and eager to see pt mobilize. REcommend lift equipment for further transfers until balance in midline with mod assist. Will continue to follow.     Follow Up Recommendations  CIR;Supervision/Assistance - 24 hour     Equipment Recommendations  Wheelchair (measurements PT);Wheelchair cushion (measurements PT);Hospital bed    Recommendations for Other Services       Precautions / Restrictions Precautions Precautions: Fall Precaution Comments: no visual tracking, decreased left sided movement and attention    Mobility  Bed Mobility Overal bed mobility: Needs Assistance Bed Mobility: Supine to Sit     Supine to sit: +2 for physical assistance;Max assist     General bed mobility comments: Two person max assist to get to EOB.  Assist to support trunk and bring both legs over EOB.  HOB 30 degrees. Pt not following commands to assist and significant posterior lean /push throughout  Transfers Overall transfer level: Needs assistance   Transfers: Sit to/from BJ'sStand;Stand Pivot Transfers Sit to Stand: Total assist;+2 physical assistance;From elevated surface Stand pivot transfers: Max assist;+2 physical assistance       General  transfer comment: stand x2 from elevated surface with max assist for anterior translation and rise with bil knees blocked and assist of pad and belt. With pivot to left pt slightly moving feet but maintains crouched posture throughout standing  Ambulation/Gait             General Gait Details: unable   Stairs            Wheelchair Mobility    Modified Rankin (Stroke Patients Only)       Balance Overall balance assessment: Needs assistance Sitting-balance support: Feet supported;Bilateral upper extremity supported;No upper extremity supported;Single extremity supported Sitting balance-Leahy Scale: Zero Sitting balance - Comments: max to total assist seated EOB. Pt with decreased posterior push with seated assist to pt's right with RUE support on PT leg Postural control: Posterior lean Standing balance support: Bilateral upper extremity supported Standing balance-Leahy Scale: Zero Standing balance comment: 2 person assist with crouched posture                            Cognition Arousal/Alertness: Lethargic Behavior During Therapy: Restless;Impulsive Overall Cognitive Status: Impaired/Different from baseline Area of Impairment: Orientation;Attention;Memory;Following commands;Safety/judgement;Awareness;Problem solving                 Orientation Level: Disoriented to;Place;Time;Situation Current Attention Level: Focused Memory: Decreased recall of precautions;Decreased short-term memory Following Commands: Follows one step commands inconsistently;Follows one step commands with increased time Safety/Judgement: Decreased awareness of safety;Decreased awareness of deficits   Problem Solving: Slow processing;Decreased initiation;Difficulty sequencing;Requires verbal cues;Requires tactile cues General Comments: pt with significant posterior lean with assist throughout sitting and standing. Pt with only  partial eye opening throughout session. PT able to  state name, "I'm supposed to be 84" and otherwise replying "yes maam" or "please don't let me go"      Exercises      General Comments        Pertinent Vitals/Pain Pain Assessment: No/denies pain    Home Living                      Prior Function            PT Goals (current goals can now be found in the care plan section) Progress towards PT goals: Progressing toward goals    Frequency           PT Plan Current plan remains appropriate    Co-evaluation PT/OT/SLP Co-Evaluation/Treatment: Yes Reason for Co-Treatment: Complexity of the patient's impairments (multi-system involvement);For patient/therapist safety PT goals addressed during session: Mobility/safety with mobility;Balance        AM-PAC PT "6 Clicks" Daily Activity  Outcome Measure  Difficulty turning over in bed (including adjusting bedclothes, sheets and blankets)?: Unable Difficulty moving from lying on back to sitting on the side of the bed? : Unable Difficulty sitting down on and standing up from a chair with arms (e.g., wheelchair, bedside commode, etc,.)?: Unable Help needed moving to and from a bed to chair (including a wheelchair)?: Total Help needed walking in hospital room?: Total Help needed climbing 3-5 steps with a railing? : Total 6 Click Score: 6    End of Session Equipment Utilized During Treatment: Gait belt Activity Tolerance: Patient limited by fatigue;Patient limited by lethargy Patient left: with chair alarm set;with family/visitor present;with call bell/phone within reach;in chair Nurse Communication: Mobility status;Need for lift equipment PT Visit Diagnosis: Muscle weakness (generalized) (M62.81);Difficulty in walking, not elsewhere classified (R26.2);Other symptoms and signs involving the nervous system (R29.898);Hemiplegia and hemiparesis;Other abnormalities of gait and mobility (R26.89);Unsteadiness on feet (R26.81) Hemiplegia - caused by: Nontraumatic  intracerebral hemorrhage     Time: 1610-9604 PT Time Calculation (min) (ACUTE ONLY): 37 min  Charges:  $Therapeutic Activity: 8-22 mins                    G Codes:       Delaney Meigs, PT [phone]    Tiena Manansala B Anella Nakata 08/21/2017, 11:05 AM

## 2017-08-22 DIAGNOSIS — R410 Disorientation, unspecified: Secondary | ICD-10-CM

## 2017-08-22 DIAGNOSIS — Z515 Encounter for palliative care: Secondary | ICD-10-CM

## 2017-08-22 LAB — BASIC METABOLIC PANEL
ANION GAP: 10 (ref 5–15)
BUN: 39 mg/dL — AB (ref 6–20)
CO2: 21 mmol/L — AB (ref 22–32)
Calcium: 8.5 mg/dL — ABNORMAL LOW (ref 8.9–10.3)
Chloride: 127 mmol/L — ABNORMAL HIGH (ref 101–111)
Creatinine, Ser: 0.95 mg/dL (ref 0.61–1.24)
GFR calc Af Amer: >60 mL/min (ref >60)
GLUCOSE: 106 mg/dL — AB (ref 65–99)
POTASSIUM: 3.3 mmol/L — AB (ref 3.5–5.1)
Sodium: 158 mmol/L — ABNORMAL HIGH (ref 135–145)

## 2017-08-22 LAB — GLUCOSE, CAPILLARY
GLUCOSE-CAPILLARY: 107 mg/dL — AB (ref 65–99)
GLUCOSE-CAPILLARY: 118 mg/dL — AB (ref 65–99)
Glucose-Capillary: 98 mg/dL (ref 65–99)
Glucose-Capillary: 95 mg/dL (ref 65–99)

## 2017-08-22 LAB — CBC
HCT: 40.9 % (ref 39.0–52.0)
Hemoglobin: 13 g/dL (ref 13.0–17.0)
MCH: 30.7 pg (ref 26.0–34.0)
MCHC: 31.8 g/dL (ref 30.0–36.0)
MCV: 96.7 fL (ref 78.0–100.0)
PLATELETS: 232 10*3/uL (ref 150–400)
RBC: 4.23 MIL/uL (ref 4.22–5.81)
RDW: 14.7 % (ref 11.5–15.5)
WBC: 9.6 10*3/uL (ref 4.0–10.5)

## 2017-08-22 LAB — SODIUM: SODIUM: 156 mmol/L — AB (ref 135–145)

## 2017-08-22 MED ORDER — QUETIAPINE FUMARATE 25 MG PO TABS
25.0000 mg | ORAL_TABLET | Freq: Two times a day (BID) | ORAL | Status: DC
  Administered 2017-08-22: 25 mg via ORAL
  Filled 2017-08-22 (×3): qty 1

## 2017-08-22 MED ORDER — POTASSIUM CHLORIDE 10 MEQ/50ML IV SOLN
10.0000 meq | INTRAVENOUS | Status: AC
  Administered 2017-08-22 (×2): 10 meq via INTRAVENOUS
  Filled 2017-08-22 (×2): qty 50

## 2017-08-22 MED ORDER — FREE WATER
200.0000 mL | Status: DC
  Administered 2017-08-22 – 2017-08-23 (×4): 200 mL

## 2017-08-22 MED ORDER — TAMSULOSIN HCL 0.4 MG PO CAPS
0.4000 mg | ORAL_CAPSULE | Freq: Once | ORAL | Status: AC
  Administered 2017-08-22: 0.4 mg via ORAL
  Filled 2017-08-22: qty 1

## 2017-08-22 MED ORDER — SENNOSIDES-DOCUSATE SODIUM 8.6-50 MG PO TABS
2.0000 | ORAL_TABLET | Freq: Two times a day (BID) | ORAL | Status: DC
  Administered 2017-08-22 – 2017-08-24 (×5): 2 via ORAL
  Filled 2017-08-22 (×5): qty 2

## 2017-08-22 MED ORDER — LABETALOL HCL 5 MG/ML IV SOLN
5.0000 mg | Freq: Once | INTRAVENOUS | Status: DC
  Filled 2017-08-22: qty 4

## 2017-08-22 MED ORDER — SODIUM CHLORIDE 0.45 % IV SOLN
INTRAVENOUS | Status: DC
  Administered 2017-08-22: 09:00:00 via INTRAVENOUS

## 2017-08-22 MED ORDER — ORAL CARE MOUTH RINSE
15.0000 mL | Freq: Three times a day (TID) | OROMUCOSAL | Status: DC
  Administered 2017-08-22 – 2017-08-25 (×8): 15 mL via OROMUCOSAL

## 2017-08-22 MED ORDER — POTASSIUM CHLORIDE 20 MEQ/15ML (10%) PO SOLN
40.0000 meq | ORAL | Status: AC
  Administered 2017-08-22 (×2): 40 meq via ORAL
  Filled 2017-08-22 (×2): qty 30

## 2017-08-22 MED ORDER — FREE WATER: 200.0000 mL | Freq: Three times a day (TID) | Status: DC

## 2017-08-22 MED ORDER — QUETIAPINE FUMARATE 25 MG PO TABS: 25.0000 mg | ORAL_TABLET | Freq: Every day | ORAL | Status: DC

## 2017-08-22 NOTE — Progress Notes (Signed)
  Speech Language Pathology Treatment: Dysphagia;Cognitive-Linquistic  Patient Details Name: [name] MRN: [id number] DOB: 1933-09-16 Today's Date: 08/22/2017 Time: 0810-0900 SLP Time Calculation (min) (ACUTE ONLY): 50 min  Assessment / Plan / Recommendation Clinical Impression  Pt presents with a severe decline from initial presentation after being given sedation for cognitive impairment and significant restlessness. Pt has been NPO with a Cortrak since being given sedation after initial SLP assessment on 1/11. Upon initial assessment pt was able to consume purees and thin liquids without signs of aspiration, but after sedation pt has not been alert enough to safely consume PO and as a result has severely dry, cracked and coated palate, lingual surface and likely coated pharynx. SLP removed 1cm thick layer of dried secretions from all mucosa. Pt received haldol during the night and despite SLP interventions, would not open his eyes today. He did begin to become aware of tactile and verbal cues and eventually accepted ice chips, puree, nectar thick liquids and water with intermittent coughing, multiple swallows and expectoration of pieces of dried mucous.   Swallow function again appears in tact, but aspiration risk is present due to delirium and and poor oral hygiene. Pt will not progress with ongoing use of sedation to manage cognitive impairment following right CVA. He has excellent potential to consume PO and participate in rehabilitation, but will need to be alert and participatory. Suggest use of 1:1 supervision for safety. Discussed with Palliative care NP and [name] and [name]. Will allow family and [name] to provide ice chips today and reassess for diet advancement tomorrow.     HPI HPI: [name] a 84 y.o.malepast medical history of old stroke with no residual deficit, atrial fibrillation on Eliquis last dose taken the morning of 08/16/2017, witnessed last  known normal day or so ago but drove to his lady friend's house this evening around 7 PM for dinner and was not behaving like his normal self. Some point during that time he started having left-sided weakness for which EMS was called. When EMS arrived, his systolic blood pressure was in the 220s-30s. He was taken for noncontrast CT of the head that showed a right temporal ICH with IVH. Midline shift with impending subfalcine herniation of the anterior cingulate gyrus.      SLP Plan  Continue with current plan of care       Recommendations  Diet recommendations: (ice chips) Liquids provided via: Teaspoon Medication Administration: Crushed with puree Supervision: Trained caregiver to feed patient;Staff to assist with self feeding(ice) Compensations: Slow rate;Small sips/bites;Minimize environmental distractions Postural Changes and/or Swallow Maneuvers: Seated upright 90 degrees;Upright 30-60 min after meal                General recommendations: Rehab consult Oral Care Recommendations: Oral care QID Follow up Recommendations: Inpatient Rehab SLP Visit Diagnosis: Attention and concentration deficit Plan: Continue with current plan of care       GO               [facility] DistrictBonnie Tasmia Blumer, MA CCC-SLP [phone]  [name], [name] 08/22/2017, 9:46 AM

## 2017-08-22 NOTE — Care Management Note (Signed)
Case Management Note  Patient Details  Name: Gregory BailGeorge Thomas [name] MRN: [id number] Date of Birth: 16-Oct-1933  Subjective/Objective:    Pt admitted with hemorrhage of rt temporal lobe. He is from home alone.               Action/Plan: Pt with Cortrak and tube feedings. Pt received Haldol last pm and has restraints. Plan is to see how progresses to see if patient will need rehab vs palliative care. CM following.   Expected Discharge Date:                  Expected Discharge Plan:  IP Rehab Facility  In-House Referral:  Clinical Social Work  Discharge planning Services  CM Consult  Post Acute Care Choice:    Choice offered to:     DME Arranged:    DME Agency:     HH Arranged:    HH Agency:     Status of Service:  In process, will continue to follow  If discussed at Long Length of Stay Meetings, dates discussed:    Additional Comments:  Kermit BaloKelli F Briahnna Harries, RN 08/22/2017, 11:29 AM

## 2017-08-22 NOTE — Progress Notes (Signed)
Daily Progress Note   Patient Name: [name]       Date: 08/22/2017 DOB: 1934/06/30  Age: 84 y.o. MRN#: [id number] Attending Physician: Marvel PlanXu, Jindong, MD [facility] Date: 08/16/2017  Reason for Consultation/Follow-up: Establishing goals of care  Subjective: Patient in bed. Feeding patient ice chips and applesauce.  Speech eval showed patient with great deal of dried secretions in mouth and airway. Encouraging po intake of liquids, ice chips. Patient swallowing. More alert today.  Family remains hopeful for continued improvement.  Per family patient gets fearful at night- they are going to attempt to stay with him this evening.   Review of Systems  Unable to perform ROS: Acuity of condition    Length of Stay: 6  Current Medications: Scheduled Meds:  .  stroke: mapping our early stages of recovery book   Does not apply Once  . amLODipine  10 mg Oral Daily  . atorvastatin  20 mg Oral QPM  . Chlorhexidine Gluconate Cloth  6 each Topical Daily  . Chlorhexidine Gluconate Cloth  6 each Topical Q0600  . feeding supplement (PRO-STAT SUGAR FREE 64)  30 mL Per Tube Daily  . finasteride  5 mg Oral Daily  . free water  200 mL Per Tube Q4H  . lidocaine  1 patch Transdermal Q24H  . losartan  100 mg Oral Daily  . mouth rinse  15 mL Mouth Rinse Q8H  . mupirocin ointment  1 application Nasal BID  . pantoprazole  40 mg Oral Daily  . potassium chloride  40 mEq Oral Q4H  . QUEtiapine  25 mg Oral BID  . senna-docusate  2 tablet Oral BID  . sertraline  25 mg Oral Daily  . sodium chloride flush  10-40 mL Intracatheter Q12H  . tamsulosin  0.4 mg Oral QHS    Continuous Infusions: . sodium chloride 50 mL/hr at 08/22/17 1230  . cefTRIAXone  (ROCEPHIN)  IV Stopped (08/22/17 0300)  . feeding supplement (VITAL 1.5 CAL) Stopped (08/21/17 0700)    PRN Meds: acetaminophen **OR** acetaminophen (TYLENOL) oral liquid 160 mg/5 mL **OR** acetaminophen, ketorolac, labetalol, sodium chloride flush  Physical Exam  Constitutional: He appears well-developed and well-nourished.  Pulmonary/Chest: Effort normal and breath sounds normal.  Abdominal: Soft.  Neurological: He is alert.  Swallowing,  moving RUE and RLE, some neglect of L side at times, attempting to talk, family reports was moving L side today and talking earlier today  Skin: Skin is warm and dry.  Nursing note and vitals reviewed.           Vital Signs: BP (!) 153/81 (BP Location: Left Arm)   Pulse 92   Temp 98.8 F (37.1 C) (Axillary)   Resp (!) 24   Ht 5\' 11"  (1.803 m)   Wt 77 kg (169 lb 12.1 oz)   SpO2 97%   BMI 23.68 kg/m  SpO2: SpO2: 97 % O2 Device: O2 Device: Not Delivered O2 Flow Rate: O2 Flow Rate (L/min): 4 L/min  Intake/output summary:   Intake/Output Summary (Last 24 hours) at 08/22/2017 1719 Last data filed at 08/22/2017 0650 Gross per 24 hour  Intake 910 ml  Output 1050 ml  Net -140 ml   LBM: Last BM Date: 08/20/17 Baseline Weight: Weight: 89 kg (196 lb 3.4 oz) Most recent weight: Weight: 77 kg (169 lb 12.1 oz)       Palliative Assessment/Data:    Flowsheet Rows     Most Recent Value  Intake Tab  Referral Department  Hospitalist  Unit at Time of Referral  Med/Surg Unit  Palliative Care Primary Diagnosis  Neurology  Date Notified  08/20/17  Palliative Care Type  New Palliative care  Reason for referral  Clarify Goals of Care  Date of Admission  08/16/17  Date first seen by Palliative Care  08/21/17  # of days Palliative referral response time  1 Day(s)  # of days IP prior to Palliative referral  4  Clinical Assessment  Psychosocial & Spiritual Assessment  Palliative Care Outcomes      Patient Active Problem List   Diagnosis Date  Noted  . Advance care planning   . Goals of care, counseling/discussion   . Palliative care by specialist   . Hemorrhage of right temporal lobe (HCC) 08/18/2017  . Subdural hematoma (HCC) 08/17/2017  . Dysarthria due to acute stroke (HCC) 08/17/2017  . Bacteriuria with pyuria 08/17/2017  . Intraventricular hemorrhage (HCC) 08/16/2017  . Left shoulder pain 02/17/2017  . 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 Hydrocephalus 02/13/2017  . BPH (benign prostatic hyperplasia) 05/01/2016  . Paroxysmal atrial fibrillation (HCC) 05/01/2016  . Hyperlipidemia, unspecified 05/01/2016    Palliative Care Assessment & Plan   Patient Profile: 84 y.o. male  with past medical history of a fib on Eliquis, CVA 02/2017,  admitted on 08/16/2017 with L facial droop, L sided weakness, found to have large R temporal hematoma with intraventricular hemorrhage. Most recent CT on 1/13 showed 3mm R to left midline shift. Has had some changing breathing patterns that have been concerning, periods of apnea- chest xray is clear. Has NG tube in place- currently clogged- plan for revision tomorrow. Has not completed SLP eval. Showing slight improvements daily in mental status, but overall prognosis currently unknown, could be poor. Palliative medicine consulted for GOC.   Assessment/Recommendations/Plan   Recommend decreasing sedating medications and treating agitation with alternative methods-   Could consider Trazadone vs seroquel at night? (better tolerated in geriatric population with less side effects during the day)  Sitter when family is not available  Offer IV Toradol or acetaminophen when patient is agitated due to history of hip pain per family report  Patient also has history of urinary retention and chronic self cath with recurrent UTI's- recommend UA  Music  therapy may be beneficial- recommend playing some of patient's favorite music or music associated with  nostalgic or calming memories   PMT will continue to follow and offer support to family  Goals of Care and Additional Recommendations:  Limitations on Scope of Treatment: Full Scope Treatment  Code Status:  DNR  Prognosis:   Unable to determine  Discharge Planning:  To Be Determined  Care plan was discussed with SLP, patient's family, patient's nurse.   Thank you for allowing the Palliative Medicine Team to assist in the care of this patient.   Time In: 1430 Time Out: 1515 Total Time 45 mins Prolonged Time Billed no      Greater than 50%  of this time was spent counseling and coordinating care related to the above assessment and plan.  Ocie Bob, AGNP-C Palliative Medicine   Please contact Palliative Medicine Team phone at [phone] for questions and concerns.

## 2017-08-22 NOTE — Progress Notes (Signed)
BLADDER SCAN REVEALED 350--500 CC URINE IN BLADDER. 16 FR FOLEY CATH INSERTED TO OBTAIN RETURN. 455 CC YEALLOW URINE OBTAINED ON INSERTION. PLACED TO GRAVITY. TOLERATED WELL, NO OBVIOUS BLEEDING/TRAUMA NOTED. Jorene GuestL. SYLVA RN TO ASSIST.

## 2017-08-22 NOTE — Progress Notes (Signed)
Pt observed not have voided, bladder re scanned, , straight cath done, drained out, peri care done. [name], [name]

## 2017-08-22 NOTE — Progress Notes (Signed)
NEUROHOSPITALISTS STROKE TEAM - DAILY PROGRESS NOTE   SUBJECTIVE (INTERVAL HISTORY) Daughters are at the bedside. Patient remains confused. Multiple doses of Haldol given overnight. Cortrak clogged and RN aware of need to repair today. IVF changed to 1/2 NS due to elevated Na today. Continues to work with PT/OT, on restraint. OSA Breathing pattern when sleeping. Palliative following, appreciate assistance.    OBJECTIVE Lab Results: CBC:  Recent Labs  Lab 08/20/17 0517 08/21/17 0500 08/22/17 0448  WBC 13.6* 13.4* 9.6  HGB 14.0 13.8 13.0  HCT 42.4 42.2 40.9  MCV 94.0 95.3 96.7  PLT 234 233 232   BMP: Recent Labs  Lab 08/18/17 0528 08/18/17 0638 08/18/17 2137 08/19/17 0318  08/19/17 1515 08/19/17 2134 08/20/17 0517 08/21/17 0500 08/22/17 0448  NA 145 146* 147*  146* 148*   < > 150* 151* 152* 150* 158*  K 3.0* 3.2*  --   --   --   --   --  3.3* 2.9* 3.3*  CL 116* 115*  --   --   --   --   --  121* 114* 127*  CO2 22 23  --   --   --   --   --  24 22 21*  GLUCOSE 110* 126*  --   --   --   --   --  175* 310* 106*  BUN 9 9  --   --   --   --   --  29* 33* 39*  CREATININE 0.79 0.81  --   --   --   --   --  0.84 0.72 0.95  CALCIUM 8.5* 8.9  --   --   --   --   --  9.3 7.6* 8.5*  MG 1.9 2.0 2.2 2.1  --   --   --   --   --   --   PHOS 2.4* 2.5 2.6 2.6  --   --   --   --   --   --    < > = values in this interval not displayed.   Liver Function Tests:  Recent Labs  Lab 08/16/17 2240  AST 23  ALT 27  ALKPHOS 74  BILITOT 0.9  PROT 6.7  ALBUMIN 3.8   No results for input(s): APTT, INR in the last 72 hours. Urine Drug Screen:     Component Value Date/Time   LABOPIA NONE DETECTED 02/13/2017 0908   COCAINSCRNUR NONE DETECTED 02/13/2017 0908   LABBENZ NONE DETECTED 02/13/2017 0908   AMPHETMU NONE DETECTED 02/13/2017 0908   THCU NONE DETECTED 02/13/2017 0908   LABBARB NONE DETECTED 02/13/2017 0908    PHYSICAL  EXAM Temp:  [98.7 F (37.1 C)-99.6 F (37.6 C)] 98.8 F (37.1 C) (01/16 0630) Pulse Rate:  [88-92] 92 (01/16 0630) Resp:  [20-24] 24 (01/16 0630) BP: (153-172)/(74-102) 153/81 (01/16 1029) SpO2:  [97 %-100 %] 97 % (01/16 1029) Weight:  [77 kg (169 lb 12.1 oz)] 77 kg (169 lb 12.1 oz) (01/15 2300) General - Well nourished, well developed elderly Caucasian male, not in acute distress, breathing improved from yesterday but still has obstructive sleep apnea  HEENT-  Normocephalic, Normal external eye/conjunctiva.  Normal external ears. Normal external nose, mucus membranes and septum.   Cardiovascular - Regular rate and rhythm  Neurological Exam : Mental status: eyes closed, briefly open to voice, orientated to self and people but not to time or place. Severe dysarthria with intelligible words..  Cranial  nerves: Pupils equal round reactive to light but against eye opening with me. No significant facial drop on exam. Motor exam: b/l upper extremity moving equally, against gravity, not following commands, in mittens bilaterally, 3/5 left lower extremity, 4/5 right lower extremities on pain stimulation. DTR 1+ and not cooperative on babinski testing. Sensory and coordination and gait not tested  IMAGING: I have personally reviewed the radiological images below and agree with the radiology interpretations. Ct Head Wo Contrast Result Date: 08/17/2017 IMPRESSION: 1. Decreased right hydrocephalus and improved right to left midline shift, now 1 mm previously 9 mm. Relatively unchanged large right temporal intraparenchymal hemorrhage and tiny parafalcine subdural hematoma. Small amount of left intraventricular hemorrhage now noted. 2. Atrophy, chronic small-vessel white matter ischemic changes and remote infarcts.   Ct Head Code Stroke Wo Contrast Result Date: 08/16/2017 IMPRESSION: 1. Intraparenchymal hematoma in the anterior right temporal lobe with volume of 11 cubic cm with associated intraventricular  extension filling the right lateral ventricle temporal and occipital horns. 2. Leftward midline shift measuring 5-9 mm with early/impending subfalcine herniation of the anterior cingulate gyrus.   Echocardiogram:       02/14/2017                                        Study Conclusions - Left ventricle: The cavity size was normal. Systolic function was   normal. The estimated ejection fraction was in the range of 55%   to 60%. - Aortic valve: There was trivial regurgitation. - Mitral valve: Calcified annulus. Mildly thickened leaflets .   There was mild regurgitation.  PORTABLE CHEST 1 VIEW IMPRESSION: No active disease. 08/20/2017   Ct Head Wo Contrast Result Date: 08/17/2017 IMPRESSION: 1. Evolving RIGHT basal ganglia/temporal lobe hemorrhage with intraventricular extension. Similar 3 mm RIGHT to LEFT midline shift. No hydrocephalus. 2. Acute trace parafalcine subdural hematoma. Evolving acute RIGHT cerebral micro hemorrhages seen with amyloid angiopathy. 3. Multiple old large vascular territory and small vessel infarcts. Electronically Signed   By: Awilda Metro M.D.   On: 08/17/2017 22:49   Ct Head Wo Contrast Result Date: 08/17/2017 IMPRESSION: 1. Decreased right hydrocephalus and improved right to left midline shift, now 1 mm previously 9 mm. Relatively unchanged large right temporal intraparenchymal hemorrhage and tiny parafalcine subdural hematoma. Small amount of left intraventricular hemorrhage now noted. 2. Atrophy, chronic small-vessel white matter ischemic changes and remote infarcts. Electronically Signed   By: Harmon Pier M.D.   On: 08/17/2017 11:11   Ct Head Wo Contrast Result Date: 08/19/2017  IMPRESSION: Stable hematoma in right basal ganglia and intraventricular hemorrhage in the right greater than left lateral ventricles. Stable associated edema and mass effect with 3 mm right-to-left midline shift. No new acute intracranial abnormality identified. Electronically Signed    By: Mitzi Hansen M.D.   On: 08/19/2017 13:07      IMPRESSION: [name] is a 84 y.o. male with PMH of stroke without residual, A. fib on Eliquis admitted for left-sided weakness headache and right-sided gaze, left-sided neglect.  CT showed right basal ganglia and temporal lobe ICH with IVH, as well as chronic left MCA infarct.  Status post Kcentra reversed.  Repeat CT x 2 showed stable hemorrhage and midline shift as well as IVH.  MRI not able to be done due to agitation.  EF 55-60%.  LDL 110 and A1c 5.8.  Large Right temporal  intraparenchymal hemorrhage and tiny parafalcine subdural hematoma.  Suspected Etiology: Likely HTN hemorrhage  setting of underlying coagulopathy by Eliquis. Resultant Symptoms: fixed rightward gaze, left upper extremity plegia, left lower extremity paresis, dense sensory loss and neglect of the left side. Stroke Risk Factors: atrial fibrillation, hyperlipidemia and hypertension Other Stroke Risk Factors: Advanced age, Hx stroke  Outstanding Stroke Work-up Studies:    Work up completed at this time  08/21/16: He a little more awake alert but still had intermittent agitation, on restraints.  Na 150, off 3% saline and salt tablet.  cortrak clogged on IVF. K low with supplement via PICC line.   Leukocytosis stable today. CXR and UA unremarkable. Continue Rocephin. Palliative care on board. Need cortrak revision tomorrow. I had long discussion with daughters at bedside, updated pt current condition, treatment plan and potential poor prognosis. They are very reasonable and agree with comfort care if declines.   08/22/2017 ASSESSMENT:   Neuro exam stable with left sided weakness. Medicated multiple times overnight with Haldol. Remains confused. Family at bedside. Pt with history of OSA with no CPAP since hospitalization. Multiple episodes of Urinary retention reported by nursing. No leukocytosis today and remains afebrile.   PLAN   08/22/2017: Frequent neuro checks Telemetry monitoring PT/OT/SLP Consult PM & Rehab - awaiting decision CIR vs SNF Consult Case Management /MSW Ongoing aggressive stroke risk factor management Patient's family counseled to be compliant with his antithrombotic medications Patient's family counseled on Lifestyle modifications including, Diet, Exercise, and Stress Follow up with [facility] in 6 weeks  HX OF STROKES: remote left frontoparietal, right MCA and left cerebellar infarcts  MEDICAL ISSUES: DYSPHAGIA: Passed SLP swallow evaluation but would like patient to be more awake prior to feeding Continue Cortrek and TF's for now, [employer] nurse to unclog tube today Aspiration Precautions in progress  CEREBRAL EDEMA: 3% NS Infusion Discontinued Na 158 today  Last Head CT with stable findings Will continue to follow closely  AFIB, CHRONIC: Hx of atrial fibrillation on Eliquis last dose taken the morning of 08/16/2017 reversal of eliquis with K-centra per pharmacy protocol and d/c Eliquis Rate control  Hypertensive Encephalopathy B/P's stable overnight Will continue to monitor closely  AKI- Resolved IVF's in progress  Anemia on Admission-Resolved Will continue to monitor  Hypernatremia Changed IVF's to 1/2 NS and decreased rate Added Free water to TF's Will continue to monitor closely Repeat labs in AM  Hypokalemia Replacement in progress Repeat labs in AM  Hypo Calcium Replaced, remains stable, will continue to monitor  OSA w/CPAP RT consulted Continuous O2 sat in progress No need for Oxygen supplementation thus far  Likely Baseline Dementia with sundowning Will add Seroquel back at low dose at HS Family to be at bedside tonight Ativan and Haldol discontinued Will continue to monitor closely Encourage family to stay with patient at all times  BPH with Urinary Retention Home meds restarted Discontinue foley when possible Urinalysis- mostly  negative on 08/20/2017 Foley reinserted today due to urinary retention Extra dose of Flomax given  HYPERTENSION: Stable. Episode of elevated B/P this AM due to no B/P meds due to no Cortrek PRN Labatelol IV given with good effects BP goal: 140 SBP range Norvasc and Cozaar started Long term BP goal normotensive. Home Meds: NONE  HYPERLIPIDEMIA:    Component Value Date/Time   CHOL 197 08/18/2017 2137   TRIG 163 (H) 08/20/2017 0517   HDL 61 08/18/2017 2137   CHOLHDL 3.2 08/18/2017 2137   VLDL 26 08/18/2017  2137   LDLCALC 110 (H) 08/18/2017 2137  Home Meds:  NONE LDL  goal < 70 Started on Lipitor 20 mg daily Continue statin at discharge  R/O DIABETES: Lab Results  Component Value Date   HGBA1C 5.8 (H) 08/18/2017  HgbA1c goal < 7.0  Other Active Problems: Principal Problem:   Hemorrhage of right temporal lobe (HCC) Active Problems:   Hydrocephalus   Intraventricular hemorrhage (HCC)   Subdural hematoma (HCC)   Dysarthria due to acute stroke (HCC)   Bacteriuria with pyuria   Advance care planning   Goals of care, counseling/discussion   Palliative care by specialist    Hospital day # 6 VTE prophylaxis:SCD's  Diet : Diet NPO time specified   FAMILY UPDATES: No family at bedside  TEAM UPDATES: [name], Gregory Vukelich, MD STATUS:  DNR     Prior Home Stroke Medications:  aspirin 81 mg daily and Eliquis (apixaban) daily  Discharge Stroke Meds:  Please discharge patient on TBD   Disposition: 01-Home or Self Care Therapy Recs:               CIR vs SNF Home Equipment:         PENDING Follow Up:  Follow-up Information    Micki RileySethi, Pramod S, MD. Schedule an appointment as soon as possible for a visit in 6 week(s).   Specialties:  Neurology, Radiology Contact information: [address] [place] [address] [phone]          [facility] Follow up in 1-2 weeks       Brita RompMary A Costello, ANP-C Stroke Neurology Team 08/22/2017 3:59  PM  Attending note:   I have personally examined this patient, reviewed notes, independently viewed imaging studies, participated in medical decision making and plan of care. I have made any additions or clarifications directly to the above note. Agree with note above.    Transferred to floor yesterday, still has intermittent agitation, on restrain and received haldol over night.  now on seroquel bid. Although off 3% saline and salt tablet, pt Na 158 today, likely due to dehydration with cortrak clogged yesterday. It has been fixed by cortrak team today and will start tube feeding, free water, also initiated 1/2 NS IVF. K low with supplement via PICC line again.  Urine culture showed Klebsiella, sensitive to rocephine. Will continue Rocephin for total 7 days (day 5 today). Palliative care on board, appreciate recs.   Gregory PlanJindong Thoma Paulsen, MD PhD Stroke Neurology 08/22/2017 5:29 PM    To contact Stroke Continuity provider, please refer to [email or web address]. After hours, contact General Neurology

## 2017-08-22 NOTE — Progress Notes (Signed)
Pt said not to have passed urine since foley catheter was removed prior to transfer from ICU, bladder scanned to a volume of 601 at 0030, about 500cc of clear urine was drained out, pt very agitated and restless, Dr Laurence SlateAroor paged and notified as his scheduled Haldol is not due till 0106, okayed to give it now, same given at 0037, repositioned and made comfortable in bed, will however continue to monitor. Obasogie-Asidi, Raphael Fitzpatrick Efe

## 2017-08-23 ENCOUNTER — Inpatient Hospital Stay (HOSPITAL_COMMUNITY): Payer: Medicare Other

## 2017-08-23 DIAGNOSIS — I629 Nontraumatic intracranial hemorrhage, unspecified: Secondary | ICD-10-CM

## 2017-08-23 DIAGNOSIS — I4891 Unspecified atrial fibrillation: Secondary | ICD-10-CM

## 2017-08-23 DIAGNOSIS — E870 Hyperosmolality and hypernatremia: Secondary | ICD-10-CM

## 2017-08-23 DIAGNOSIS — D72829 Elevated white blood cell count, unspecified: Secondary | ICD-10-CM

## 2017-08-23 DIAGNOSIS — A419 Sepsis, unspecified organism: Secondary | ICD-10-CM

## 2017-08-23 DIAGNOSIS — J189 Pneumonia, unspecified organism: Secondary | ICD-10-CM

## 2017-08-23 DIAGNOSIS — Z4659 Encounter for fitting and adjustment of other gastrointestinal appliance and device: Secondary | ICD-10-CM

## 2017-08-23 DIAGNOSIS — Z789 Other specified health status: Secondary | ICD-10-CM

## 2017-08-23 DIAGNOSIS — R509 Fever, unspecified: Secondary | ICD-10-CM

## 2017-08-23 DIAGNOSIS — R0602 Shortness of breath: Secondary | ICD-10-CM

## 2017-08-23 LAB — URINALYSIS, ROUTINE W REFLEX MICROSCOPIC
Bilirubin Urine: NEGATIVE
GLUCOSE, UA: NEGATIVE mg/dL
Ketones, ur: NEGATIVE mg/dL
LEUKOCYTES UA: NEGATIVE
Nitrite: NEGATIVE
PH: 5 (ref 5.0–8.0)
Protein, ur: 30 mg/dL — AB
Specific Gravity, Urine: 1.025 (ref 1.005–1.030)

## 2017-08-23 LAB — CBC WITH DIFFERENTIAL/PLATELET
BASOS PCT: 1 %
Basophils Absolute: 0.1 10*3/uL (ref 0.0–0.1)
EOS ABS: 0 10*3/uL (ref 0.0–0.7)
EOS PCT: 0 %
HCT: 43.3 % (ref 39.0–52.0)
HEMOGLOBIN: 13.9 g/dL (ref 13.0–17.0)
Lymphocytes Relative: 18 %
Lymphs Abs: 1.9 10*3/uL (ref 0.7–4.0)
MCH: 31.2 pg (ref 26.0–34.0)
MCHC: 32.1 g/dL (ref 30.0–36.0)
MCV: 97.3 fL (ref 78.0–100.0)
Monocytes Absolute: 1.5 10*3/uL — ABNORMAL HIGH (ref 0.1–1.0)
Monocytes Relative: 13 %
NEUTROS PCT: 68 %
Neutro Abs: 7.5 10*3/uL (ref 1.7–7.7)
PLATELETS: 242 10*3/uL (ref 150–400)
RBC: 4.45 MIL/uL (ref 4.22–5.81)
RDW: 14.5 % (ref 11.5–15.5)
WBC: 11 10*3/uL — AB (ref 4.0–10.5)

## 2017-08-23 LAB — GLUCOSE, CAPILLARY
GLUCOSE-CAPILLARY: 138 mg/dL — AB (ref 65–99)
GLUCOSE-CAPILLARY: 143 mg/dL — AB (ref 65–99)
Glucose-Capillary: 118 mg/dL — ABNORMAL HIGH (ref 65–99)
Glucose-Capillary: 133 mg/dL — ABNORMAL HIGH (ref 65–99)
Glucose-Capillary: 118 mg/dL — ABNORMAL HIGH (ref 65–99)
Glucose-Capillary: 124 mg/dL — ABNORMAL HIGH (ref 65–99)

## 2017-08-23 LAB — COMPREHENSIVE METABOLIC PANEL
ALBUMIN: 3 g/dL — AB (ref 3.5–5.0)
ALK PHOS: 87 U/L (ref 38–126)
ALT: 81 U/L — AB (ref 17–63)
AST: 72 U/L — ABNORMAL HIGH (ref 15–41)
Anion gap: 11 (ref 5–15)
BUN: 37 mg/dL — ABNORMAL HIGH (ref 6–20)
CO2: 20 mmol/L — ABNORMAL LOW (ref 22–32)
CREATININE: 1.2 mg/dL (ref 0.61–1.24)
Calcium: 8.7 mg/dL — ABNORMAL LOW (ref 8.9–10.3)
Chloride: 124 mmol/L — ABNORMAL HIGH (ref 101–111)
GFR calc Af Amer: >60 mL/min (ref >60)
GFR calc non Af Amer: 55 mL/min — ABNORMAL LOW (ref >60)
Glucose, Bld: 128 mg/dL — ABNORMAL HIGH (ref 65–99)
POTASSIUM: 3.8 mmol/L (ref 3.5–5.1)
SODIUM: 155 mmol/L — AB (ref 135–145)
TOTAL PROTEIN: 6.7 g/dL (ref 6.5–8.1)
Total Bilirubin: 1.2 mg/dL (ref 0.3–1.2)

## 2017-08-23 LAB — PROTIME-INR
INR: 1.16
Prothrombin Time: 14.7 seconds (ref 11.4–15.2)

## 2017-08-23 LAB — STREP PNEUMONIAE URINARY ANTIGEN: Strep Pneumo Urinary Antigen: NEGATIVE

## 2017-08-23 LAB — URINALYSIS, MICROSCOPIC (REFLEX)

## 2017-08-23 LAB — LACTIC ACID, PLASMA
LACTIC ACID, VENOUS: 1.4 mmol/L (ref 0.5–1.9)
LACTIC ACID, VENOUS: 1.4 mmol/L (ref 0.5–1.9)

## 2017-08-23 LAB — PROCALCITONIN: Procalcitonin: <0.1 ng/mL

## 2017-08-23 LAB — APTT: APTT: 37 s — AB (ref 24–36)

## 2017-08-23 LAB — SODIUM: Sodium: 155 mmol/L — ABNORMAL HIGH (ref 135–145)

## 2017-08-23 MED ORDER — VANCOMYCIN HCL IN DEXTROSE 750-5 MG/150ML-% IV SOLN
750.0000 mg | Freq: Two times a day (BID) | INTRAVENOUS | Status: DC
  Administered 2017-08-23 – 2017-08-24 (×3): 750 mg via INTRAVENOUS
  Filled 2017-08-23 (×3): qty 150

## 2017-08-23 MED ORDER — PIPERACILLIN-TAZOBACTAM 3.375 G IVPB
3.3750 g | Freq: Three times a day (TID) | INTRAVENOUS | Status: DC
  Administered 2017-08-23 – 2017-08-25 (×7): 3.375 g via INTRAVENOUS
  Filled 2017-08-23 (×8): qty 50

## 2017-08-23 MED ORDER — FREE WATER
300.0000 mL | Status: DC
  Administered 2017-08-23 – 2017-08-25 (×10): 300 mL

## 2017-08-23 MED ORDER — FREE WATER
200.0000 mL | Status: DC
  Administered 2017-08-23 (×2): 200 mL

## 2017-08-23 MED ORDER — SODIUM CHLORIDE 0.9 % IV SOLN
INTRAVENOUS | Status: DC
  Administered 2017-08-23: 05:00:00 via INTRAVENOUS

## 2017-08-23 MED ORDER — SODIUM CHLORIDE 0.9 % IV SOLN
INTRAVENOUS | Status: DC
  Administered 2017-08-23: 07:00:00 via INTRAVENOUS

## 2017-08-23 MED ORDER — IPRATROPIUM-ALBUTEROL 0.5-2.5 (3) MG/3ML IN SOLN
3.0000 mL | Freq: Four times a day (QID) | RESPIRATORY_TRACT | Status: DC
  Administered 2017-08-23 – 2017-08-24 (×5): 3 mL via RESPIRATORY_TRACT
  Filled 2017-08-23 (×5): qty 3

## 2017-08-23 NOTE — Progress Notes (Signed)
Physical Therapy Treatment Patient Details Name: [name] MRN: [id number] DOB: 24-Jun-1934 Today's Date: 08/23/2017    History of Present Illness 84 y.o. male admitted on 08/16/17 for left sided weakness.  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 1/12-1/13 respiratory difficulty. Pt with significant PMH of stroke, A-fib, and L THA.    PT Comments    Patient with limited participation due to lethargy with rough night per family with high fever.  Able to sit EOB about 10 minutes with max cues for eyes open, to lift head (but pt using hand to lift his head,) and for leaning to L .  Patient currently appropriate for SNF level rehab.  Will continue skilled PT as tolerated in acute setting.   Follow Up Recommendations  SNF;Supervision/Assistance - 24 hour     Equipment Recommendations  Wheelchair (measurements PT);Wheelchair cushion (measurements PT);Hospital bed    Recommendations for Other Services       Precautions / Restrictions Precautions Precautions: Fall    Mobility  Bed Mobility Overal bed mobility: Needs Assistance Bed Mobility: Rolling;Sit to Sidelying;Sit to Supine     Supine to sit: +2 for physical assistance;Total assist;HOB elevated Sit to supine: +2 for physical assistance;Total assist   General bed mobility comments: assist to get feet off bed and to lift trunk  Transfers                 General transfer comment: NT, unsafe due to lethargy and pt leaning L  Ambulation/Gait                 Stairs            Wheelchair Mobility    Modified Rankin (Stroke Patients Only) Modified Rankin (Stroke Patients Only) Pre-Morbid Rankin Score: No symptoms Modified Rankin: Severe disability     Balance Overall balance assessment: Needs assistance Sitting-balance support: Feet supported;Bilateral upper extremity supported Sitting balance-Leahy  Scale: Zero Sitting balance - Comments: intermittently needing max to mod A.  Pt leans R, easily distracted so  can correct some with multimodal cues and assist, then degrades  Postural control: Posterior lean;Right lateral lean Standing balance support: Single extremity supported;No upper extremity supported                                Cognition Arousal/Alertness: Lethargic Behavior During Therapy: Restless Overall Cognitive Status: Impaired/Different from baseline Area of Impairment: Orientation;Attention;Memory;Following commands;Safety/judgement;Awareness;Problem solving                 Orientation Level: Disoriented to;Place;Time;Situation Current Attention Level: Focused   Following Commands: Follows one step commands inconsistently;Follows one step commands with increased time Safety/Judgement: Decreased awareness of safety;Decreased awareness of deficits Awareness: Intellectual Problem Solving: Slow processing;Decreased initiation;Difficulty sequencing;Requires verbal cues;Requires tactile cues General Comments: Family report pt with fever overnight and they worked to lower with cloths.  Pt sleeping when PT arrived, much assist to rouse pt only once up at EOB. difficulty following commands for balance/posture/trunk strenght work at [employer]      Exercises      General Comments General comments (skin integrity, edema, etc.): one daughter in room during session      Pertinent Vitals/Pain Faces Pain Scale: No hurt    Home Living                      Prior  Function            PT Goals (current goals can now be found in the care plan section) Progress towards PT goals: Not progressing toward goals - comment(due to lethargy, limited prognosis)    Frequency    Min 3X/week      PT Plan Discharge plan needs to be updated;Frequency needs to be updated    Co-evaluation              AM-PAC PT "6 Clicks" Daily Activity  Outcome  Measure    Difficulty moving from lying on back to sitting on the side of the bed? : Unable Difficulty sitting down on and standing up from a chair with arms (e.g., wheelchair, bedside commode, etc,.)?: Unable Help needed moving to and from a bed to chair (including a wheelchair)?: Total Help needed walking in hospital room?: Total Help needed climbing 3-5 steps with a railing? : Total 6 Click Score: 5    End of Session   Activity Tolerance: Patient limited by fatigue;Patient limited by lethargy Patient left: in bed;with bed alarm set;with family/visitor present   PT Visit Diagnosis: Muscle weakness (generalized) (M62.81);Difficulty in walking, not elsewhere classified (R26.2);Other symptoms and signs involving the nervous system (R29.898);Hemiplegia and hemiparesis;Other abnormalities of gait and mobility (R26.89);Unsteadiness on feet (R26.81) Hemiplegia - Right/Left: Left Hemiplegia - dominant/non-dominant: Non-dominant Hemiplegia - caused by: Nontraumatic intracerebral hemorrhage     Time: 2956-2130 PT Time Calculation (min) (ACUTE ONLY): 29 min  Charges:  $Therapeutic Activity: 23-37 mins                    G CodesSheran Lawless, [place] [phone] 08/23/2017    Elray Mcgregor 08/23/2017, 12:25 PM

## 2017-08-23 NOTE — Progress Notes (Addendum)
Pharmacy Antibiotic Note  Gregory BailGeorge Thomas [name] is a 84 y.o. male admitted on 08/16/2017 with ICH.  Pharmacy has been consulted for Vancomycin/Zosyn dosing for possible aspiration PNA. Pt febrile up to 102. WBC 9.6. Renal function age appropriate. CXR with new left lung base opacity.   Plan: Vancomycin 750 mg IV q12h Zosyn 3.375G IV q8h to be infused over 4 hours Trend WBC, temp, renal function  F/U infectious work-up Drug levels as indicated   Height: 5\' 11"  (180.3 cm) Weight: 169 lb 12.1 oz (77 kg) IBW/kg (Calculated) : 75.3  Temp (24hrs), Avg:100.4 F (38 C), Min:98.8 F (37.1 C), Max:102 F (38.9 C)  Recent Labs  Lab 08/16/17 2240  08/18/17 0528 08/18/17 0638 08/20/17 0517 08/21/17 0500 08/22/17 0448  WBC 7.3  --   --  9.7 13.6* 13.4* 9.6  CREATININE 1.16   < > 0.79 0.81 0.84 0.72 0.95   < > = values in this interval not displayed.    Estimated Creatinine Clearance: 65 mL/min (by C-G formula based on SCr of 0.95 mg/dL).    No Known Allergies   [name], [name] 08/23/2017 3:12 AM

## 2017-08-23 NOTE — Progress Notes (Addendum)
Dr. Laurence SlateAroor notified pt remains febrile/tachypneic & minimally responsive to painful stimuli. MD to come assess pt. Daughter remains @ bedside. Will continue to monitor.

## 2017-08-23 NOTE — Progress Notes (Addendum)
Dr. Laurence SlateAroor notified spike in temp,102.3 oral, with associated tachypnea & minimal response to painful stimuli. Rectal Tylenol administered as ordered. MD aware of CXR findings & labs. Will continue to monitor.

## 2017-08-23 NOTE — Progress Notes (Signed)
Occupational Therapy Treatment Patient Details Name: [name] MRN: [id number] DOB: 04-20-1934 Today's Date: 08/23/2017    History of present illness 84 y.o. male admitted on 08/16/17 for left sided weakness.  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 1/12-1/13 respiratory difficulty. Pt with significant PMH of stroke, A-fib, and L THA.   OT comments  This 84 yo male admitted with above presents to acute OT not making progress towards goals (did have a fever over night and earlier this AM). Pt lethargic and only minimal arousal. Feel based of pt current status SNF level of rehab now more appropriate than CIR. We will continue to follow.  Follow Up Recommendations  SNF;Supervision/Assistance - 24 hour    Equipment Recommendations  Hospital bed;Wheelchair cushion (measurements OT);Wheelchair (measurements OT)       Precautions / Restrictions Precautions Precautions: Fall Restrictions Weight Bearing Restrictions: No              ADL either performed or assessed with clinical judgement   ADL                                         General ADL Comments: Placed cold and then warm wash cloth in pt's RUE and asked him to wash face, no response other than moving RUE some but not towards head, threw washcloth on bed first time and did mumble. Tired hand over hand, pt did not attempt to A.     Vision   Additional Comments: no significant eye opening, roused x1          Cognition Arousal/Alertness: Lethargic Behavior During Therapy: Restless Overall Cognitive Status: Impaired/Different from baseline Area of Impairment: Orientation;Attention;Memory;Following commands;Safety/judgement;Awareness;Problem solving                 Orientation Level: Disoriented to;Place;Time;Situation Current Attention Level: Focused   Following Commands: Follows one step  commands inconsistently;Follows one step commands with increased time Safety/Judgement: Decreased awareness of safety;Decreased awareness of deficits Awareness: Intellectual Problem Solving: Slow processing;Decreased initiation;Difficulty sequencing;Requires verbal cues;Requires tactile cues General Comments: pt not responding to commands of open eyes, squeeze hand. He responded only to nail bed pressure Bil UEs (moved RUE and grimaced when nail bed pressure applied); grimaced and reached with RUE towards LUE when nail bed pressure applied to LUE). Noted spontaneous movement of RUE but none of LUE                   Pertinent Vitals/ Pain       Faces Pain Scale: No hurt. Pt did respond to nail bed pressure but not to sternal rub     Prior Functioning/Environment              Frequency  Min 2X/week        Progress Toward Goals  OT Goals(current goals can now be found in the care plan section)  Progress towards OT goals: Not progressing toward goals - comment(unable to arouse, only responding to pain for nailbed pressure and some spontaneous movement of RUE)     Plan Discharge plan needs to be updated       AM-PAC PT "6 Clicks" Daily Activity     Outcome Measure   Help from another person eating meals?: Total Help from another person taking care of personal grooming?: Total Help from another person toileting,  which includes using toliet, bedpan, or urinal?: Total Help from another person bathing (including washing, rinsing, drying)?: Total Help from another person to put on and taking off regular upper body clothing?: Total Help from another person to put on and taking off regular lower body clothing?: Total 6 Click Score: 6    End of Session    OT Visit Diagnosis: Other abnormalities of gait and mobility (R26.89);Muscle weakness (generalized) (M62.81);Low vision, both eyes (H54.2);Other symptoms and signs involving cognitive function   Activity Tolerance Patient  limited by lethargy   Patient Left in bed;with bed alarm set;with family/visitor present   Nurse Communication (RN to look for pillow to put under pt's LUE)        Time: [id number] OT Time Calculation (min): 17 min  Charges: OT General Charges $OT Visit: 1 Visit OT Treatments $Therapeutic Activity: 8-22 mins  Ignacia PalmaCathy Alisyn Lequire, OTR/L [phone] 08/23/2017

## 2017-08-23 NOTE — Consult Note (Signed)
Medical Consultation   [name]  ZOX:[id number]  DOB: 1933/09/03  DOA: 08/16/2017  PCP: [facility]   Requesting physician: Dr. Laurence Slate   Reason for consultation:  Sepsis   History of Present Illness: [name] is an 84 y.o. 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  He developed a UTI and was started on Rocephin.  Cultures have since finalized with Klebsiella, sensitive to Rocephin.  He had been progressing as expected and until development of sepsis with acute respiratory distress tonight.    Patient had reportedly been agitated earlier in the evening, requiring Haldol, but there had not been any apparent respiratory distress.  Overnight, he developed a fever to 38.9 degrees C with tachycardia and tachypnea.  Primary physician was notified, obtained stat blood cultures and chest x-ray that features a left-sided opacity concerning for pneumonia, possibly aspiration.  The patient was appropriately started on empiric vancomycin and Zosyn.  With concern for aspiration, the tube feeds were appropriately stopped by the RN.  Fever was treated with Tylenol.    Review of Systems:  ROS As per HPI otherwise 10 point review of systems negative.   Past Medical History: Past Medical History:  Diagnosis Date  . A-fib (HCC)   . GERD (gastroesophageal reflux disease)   . Hypercholesteremia   . Stroke [facility])     Past Surgical History: Past Surgical History:  Procedure Laterality Date  . TOTAL HIP ARTHROPLASTY Left      Allergies:  No Known Allergies   Social History:  reports that  has never smoked. he has never used smokeless tobacco. He reports that he does not drink alcohol or use drugs.   Family History: Family History    Problem Relation Age of Onset  . Hypertension Father     Physical Exam: Vitals:   08/22/17 1029 08/22/17 1732 08/22/17 2023 08/23/17 0103  BP: (!) 153/81 (!) 154/77 (!) 159/82 (!) 160/90  Pulse:  (!) 102 99 (!) 107  Resp:   18 (!) 51  Temp:  (!) 101.5 F (38.6 C) 99.2 F (37.3 C) (!) 102 F (38.9 C)  TempSrc:  Axillary Axillary Axillary  SpO2: 97% 96% 96% 97%  Weight:      Height:        Constitutional: Obtunded, tachypneic, no pallor, no diaphoresis Eyes: PERLA, EOMI, irises appear normal, anicteric sclera,  ENMT: external ears and nose appear normal. Lips appears normal, oropharynx mucosa, tongue, posterior pharynx appear normal  Neck: neck appears normal, no masses, normal ROM, no thyromegaly, no JVD  CVS: Rate ~110 and irregular, no significant JVD   Respiratory:  Tachypneic, increased WOB. Coarse rhonchi at bilateral bases L>R. No pallor or cyanosis.  Abdomen: soft, nontender, nondistended, active bowel sounds  Musculoskeletal: : no cyanosis, clubbing or edema noted bilaterally Psych: Difficult to assess given the clinical scenario Skin: no rashes or lesions or ulcers, no induration or nodules    Data reviewed:  I have personally reviewed following labs and imaging studies Labs:  CBC: Recent Labs  Lab 08/16/17 2240 08/16/17 2250 08/18/17 0638 08/18/17 2137 08/20/17 0517 08/21/17 0500 08/22/17 0448  WBC 7.3  --  9.7  --  13.6* 13.4* 9.6  NEUTROABS 3.9  --   --   --   --   --   --  HGB 13.2 13.6 13.4  --  14.0 13.8 13.0  HCT 39.8 40.0 40.3 39.2 42.4 42.2 40.9  MCV 92.3  --  92.2  --  94.0 95.3 96.7  PLT 245  --  231  --  234 233 232    Basic Metabolic Panel: Recent Labs  Lab 08/18/17 0528 08/18/17 0638 08/18/17 2137 08/19/17 0318  08/19/17 2134 08/20/17 0517 08/21/17 0500 08/22/17 0448 08/22/17 1911  NA 145 146* 147*  146* 148*   < > 151* 152* 150* 158* 156*  K 3.0* 3.2*  --   --   --   --  3.3* 2.9* 3.3*  --   CL 116* 115*  --   --   --   --   121* 114* 127*  --   CO2 22 23  --   --   --   --  24 22 21*  --   GLUCOSE 110* 126*  --   --   --   --  175* 310* 106*  --   BUN 9 9  --   --   --   --  29* 33* 39*  --   CREATININE 0.79 0.81  --   --   --   --  0.84 0.72 0.95  --   CALCIUM 8.5* 8.9  --   --   --   --  9.3 7.6* 8.5*  --   MG 1.9 2.0 2.2 2.1  --   --   --   --   --   --   PHOS 2.4* 2.5 2.6 2.6  --   --   --   --   --   --    < > = values in this interval not displayed.   GFR Estimated Creatinine Clearance: 65 mL/min (by C-G formula based on SCr of 0.95 mg/dL). Liver Function Tests: Recent Labs  Lab 08/16/17 2240  AST 23  ALT 27  ALKPHOS 74  BILITOT 0.9  PROT 6.7  ALBUMIN 3.8   No results for input(s): LIPASE, AMYLASE in the last 168 hours. No results for input(s): AMMONIA in the last 168 hours. Coagulation profile Recent Labs  Lab 08/16/17 2240  INR 1.04    Cardiac Enzymes: No results for input(s): CKTOTAL, CKMB, CKMBINDEX, TROPONINI in the last 168 hours. BNP: Invalid input(s): POCBNP CBG: Recent Labs  Lab 08/22/17 0403 08/22/17 1218 08/22/17 1730 08/22/17 2026 08/23/17 0102  GLUCAP 98 95 107* 118* 143*   D-Dimer No results for input(s): DDIMER in the last 72 hours. Hgb A1c No results for input(s): HGBA1C in the last 72 hours. Lipid Profile Recent Labs    08/20/17 0517  TRIG 163*   Thyroid function studies No results for input(s): TSH, T4TOTAL, T3FREE, THYROIDAB in the last 72 hours.  Invalid input(s): FREET3 Anemia work up No results for input(s): VITAMINB12, FOLATE, FERRITIN, TIBC, IRON, RETICCTPCT in the last 72 hours. Urinalysis    Component Value Date/Time   COLORURINE YELLOW 08/20/2017 1435   APPEARANCEUR HAZY (A) 08/20/2017 1435   APPEARANCEUR Clear 05/16/2012 1535   LABSPEC 1.019 08/20/2017 1435   LABSPEC 1.005 05/16/2012 1535   PHURINE 5.0 08/20/2017 1435   GLUCOSEU NEGATIVE 08/20/2017 1435   GLUCOSEU Negative 05/16/2012 1535   HGBUR SMALL (A) 08/20/2017 1435    BILIRUBINUR NEGATIVE 08/20/2017 1435   BILIRUBINUR Negative 05/16/2012 1535   KETONESUR NEGATIVE 08/20/2017 1435   PROTEINUR 100 (A) 08/20/2017 1435   NITRITE NEGATIVE 08/20/2017 1435  LEUKOCYTESUR SMALL (A) 08/20/2017 1435   LEUKOCYTESUR Negative 05/16/2012 1535     Microbiology Recent Results (from the past 240 hour(s))  MRSA PCR Screening     Status: Abnormal   Collection Time: 08/17/17  3:39 AM  Result Value Ref Range Status   MRSA by PCR POSITIVE (A) NEGATIVE Final    Comment:        The GeneXpert MRSA Assay (FDA approved for NASAL specimens only), is one component of a comprehensive MRSA colonization surveillance program. It is not intended to diagnose MRSA infection nor to guide or monitor treatment for MRSA infections. RESULT CALLED TO, READ BACK BY AND VERIFIED WITH: B HESTER,RN AT 0924 08/17/17 BY L BENFIELD   Culture, Urine     Status: Abnormal   Collection Time: 08/17/17  6:20 PM  Result Value Ref Range Status   Specimen Description URINE, RANDOM  Final   Special Requests NONE  Final   Culture 50,000 COLONIES/mL KLEBSIELLA OXYTOCA (A)  Final   Report Status 08/20/2017 FINAL  Final   Organism ID, Bacteria KLEBSIELLA OXYTOCA (A)  Final      Susceptibility   Klebsiella oxytoca - MIC*    AMPICILLIN RESISTANT Resistant     CEFAZOLIN <=4 SENSITIVE Sensitive     CEFTRIAXONE <=1 SENSITIVE Sensitive     CIPROFLOXACIN <=0.25 SENSITIVE Sensitive     GENTAMICIN <=1 SENSITIVE Sensitive     IMIPENEM <=0.25 SENSITIVE Sensitive     NITROFURANTOIN <=16 SENSITIVE Sensitive     TRIMETH/SULFA <=20 SENSITIVE Sensitive     AMPICILLIN/SULBACTAM 4 SENSITIVE Sensitive     PIP/TAZO <=4 SENSITIVE Sensitive     Extended ESBL NEGATIVE Sensitive     * 50,000 COLONIES/mL KLEBSIELLA OXYTOCA       Inpatient Medications:   Scheduled Meds: .  stroke: mapping our early stages of recovery book   Does not apply Once  . amLODipine  10 mg Oral Daily  . atorvastatin  20 mg Oral QPM  .  Chlorhexidine Gluconate Cloth  6 each Topical Q0600  . feeding supplement (PRO-STAT SUGAR FREE 64)  30 mL Per Tube Daily  . finasteride  5 mg Oral Daily  . ipratropium-albuterol  3 mL Nebulization Q6H  . lidocaine  1 patch Transdermal Q24H  . losartan  100 mg Oral Daily  . mouth rinse  15 mL Mouth Rinse Q8H  . mupirocin ointment  1 application Nasal BID  . pantoprazole  40 mg Oral Daily  . QUEtiapine  25 mg Oral BID  . senna-docusate  2 tablet Oral BID  . sertraline  25 mg Oral Daily  . sodium chloride flush  10-40 mL Intracatheter Q12H  . tamsulosin  0.4 mg Oral QHS   Continuous Infusions: . sodium chloride 50 mL/hr at 08/22/17 1230  . sodium chloride       Radiological Exams on Admission: Dg Chest Port 1 View  Result Date: 08/23/2017 CLINICAL DATA:  Dyspnea, fever EXAM: PORTABLE CHEST 1 VIEW COMPARISON:  08/20/2017 chest radiograph. FINDINGS: Right PICC terminates at the cavoatrial junction. Enteric tube enters stomach with the tip not seen on this image. Stable cardiomediastinal silhouette with normal heart size. No pneumothorax. No pleural effusion. New mild hazy left lung base opacity. No pulmonary edema. IMPRESSION: New mild hazy left lung base opacity, which could represent atelectasis, aspiration and/or pneumonia. Recommend attention on follow-up chest radiographs. Electronically Signed   By: Delbert PhenixJason A Poff M.D.   On: 08/23/2017 02:28    Impression/Recommendations  1. Sepsis, suspected  secondary to PNA  - Developed fever to 38.9 C with tachycardia and tachypnea overnight  - Primary team obtained stat blood cultures and CXR that suggests PNA, possibly aspiration  - Has been on Rocephin for Klebsiella UTI; has hx of MRSA carriage  - Agree with antibiotic selection, continue vancomycin and Zosyn pending culture data  - Follow blood cultures, check/trend lactate and procalcitonin, check strep pneumo and legionella antigen, repeat UA with culture - Tube feeds appropriately stopped  by RN  - Currently hypertensive and slightly tachycardic in setting of fever; will give 30 cc/kg NS bolus is lactate >4   2. Atrial fibrillation  - CHADS-VASc at least 4 (age x2, CVA x2)  - No AC in light of ICH   3. Hypernatremia  - Had been on hypertonic saline, since discontinued  - Continue 1/2 NS and frequent sodium levels  4. ICH  - Per primary team; under consideration for CIR vs SNF    Thank you for this consultation.  Our [facility] hospitalist team will follow the patient with you.   Time Spent: 64 minutes  Briscoe Deutscher M.D. [facility] 08/23/2017, 2:54 AM

## 2017-08-23 NOTE — Plan of Care (Signed)
  Not Progressing Education: Knowledge of General Education information will improve 08/23/2017 1253 - Not Progressing by Quentin CornwallMadison, Jhoselyn Ruffini, RN Health Behavior/Discharge Planning: Ability to manage health-related needs will improve 08/23/2017 1253 - Not Progressing by Quentin CornwallMadison, Phenix Grein, RN Clinical Measurements: Ability to maintain clinical measurements within normal limits will improve 08/23/2017 1253 - Not Progressing by Quentin CornwallMadison, Azarel Banner, RN Will remain free from infection 08/23/2017 1253 - Not Progressing by Quentin CornwallMadison, Nikayla Madaris, RN Diagnostic test results will improve 08/23/2017 1253 - Not Progressing by Quentin CornwallMadison, Briahnna Harries, RN Respiratory complications will improve 08/23/2017 1253 - Not Progressing by Quentin CornwallMadison, Shakora Nordquist, RN Cardiovascular complication will be avoided 08/23/2017 1253 - Not Progressing by Quentin CornwallMadison, Kennetha Pearman, RN Activity: Risk for activity intolerance will decrease 08/23/2017 1253 - Not Progressing by Quentin CornwallMadison, Jennipher Weatherholtz, RN Nutrition: Adequate nutrition will be maintained 08/23/2017 1253 - Not Progressing by Quentin CornwallMadison, Wiktoria Hemrick, RN Elimination: Will not experience complications related to bowel motility 08/23/2017 1253 - Not Progressing by Quentin CornwallMadison, Silas Sedam, RN Will not experience complications related to urinary retention 08/23/2017 1253 - Not Progressing by Quentin CornwallMadison, Zhara Gieske, RN Pain Managment: General experience of comfort will improve 08/23/2017 1253 - Not Progressing by Quentin CornwallMadison, Rosy Estabrook, RN Skin Integrity: Risk for impaired skin integrity will decrease 08/23/2017 1253 - Not Progressing by Quentin CornwallMadison, Zyonna Vardaman, RN Education: Knowledge of disease or condition will improve 08/23/2017 1253 - Not Progressing by Quentin CornwallMadison, Caitlan Chauca, RN Knowledge of secondary prevention will improve 08/23/2017 1253 - Not Progressing by Quentin CornwallMadison, Cherysh Epperly, RN Knowledge of patient specific risk factors addressed and post discharge goals established will improve 08/23/2017 1253 - Not Progressing by Quentin CornwallMadison, Ileigh Mettler, RN Coping: Will  verbalize positive feelings about self 08/23/2017 1253 - Not Progressing by Quentin CornwallMadison, Tanette Chauca, RN Will identify appropriate support needs 08/23/2017 1253 - Not Progressing by Quentin CornwallMadison, Kylah Maresh, RN Health Behavior/Discharge Planning: Ability to manage health-related needs will improve 08/23/2017 1253 - Not Progressing by Quentin CornwallMadison, Jaslin Novitski, RN Self-Care: Ability to participate in self-care as condition permits will improve 08/23/2017 1253 - Not Progressing by Quentin CornwallMadison, Kayela Humphres, RN Verbalization of feelings and concerns over difficulty with self-care will improve 08/23/2017 1253 - Not Progressing by Quentin CornwallMadison, Ailynn Gow, RN Ability to communicate needs accurately will improve 08/23/2017 1253 - Not Progressing by Quentin CornwallMadison, Addaleigh Nicholls, RN Nutrition: Risk of aspiration will decrease 08/23/2017 1253 - Not Progressing by Quentin CornwallMadison, Norissa Bartee, RN Dietary intake will improve 08/23/2017 1253 - Not Progressing by Quentin CornwallMadison, Sara Keys, RN Intracerebral Hemorrhage Tissue Perfusion: Complications of Intracerebral Hemorrhage will be minimized 08/23/2017 1253 - Not Progressing by Quentin CornwallMadison, Tylicia Sherman, RN

## 2017-08-23 NOTE — Progress Notes (Signed)
NEUROHOSPITALISTS STROKE TEAM - DAILY PROGRESS NOTE   SUBJECTIVE (INTERVAL HISTORY) Fever, Tachycardia and tachypnea reported overnight. IV Antibiotics in progress. Daughters are at the bedside. Patient remains confused. No sedation given overnight. Cortrak unclogged yesterday but TF's on hold due to fever. Unable to work with PT/OT today due to mentation. Continues to have OSA Breathing pattern when sleeping. Palliative and Hospitalists following,  appreciate assistance.    OBJECTIVE Lab Results: CBC:  Recent Labs  Lab 08/21/17 0500 08/22/17 0448 08/23/17 0253  WBC 13.4* 9.6 11.0*  HGB 13.8 13.0 13.9  HCT 42.2 40.9 43.3  MCV 95.3 96.7 97.3  PLT 233 232 242   BMP: Recent Labs  Lab 08/18/17 0528 08/18/17 0638 08/18/17 2137 08/19/17 0318  08/20/17 0517 08/21/17 0500 08/22/17 0448 08/22/17 1911 08/23/17 0253  NA 145 146* 147*  146* 148*   < > 152* 150* 158* 156* 155*  K 3.0* 3.2*  --   --   --  3.3* 2.9* 3.3*  --  3.8  CL 116* 115*  --   --   --  121* 114* 127*  --  124*  CO2 22 23  --   --   --  24 22 21*  --  20*  GLUCOSE 110* 126*  --   --   --  175* 310* 106*  --  128*  BUN 9 9  --   --   --  29* 33* 39*  --  37*  CREATININE 0.79 0.81  --   --   --  0.84 0.72 0.95  --  1.20  CALCIUM 8.5* 8.9  --   --   --  9.3 7.6* 8.5*  --  8.7*  MG 1.9 2.0 2.2 2.1  --   --   --   --   --   --   PHOS 2.4* 2.5 2.6 2.6  --   --   --   --   --   --    < > = values in this interval not displayed.   Liver Function Tests:  Recent Labs  Lab 08/16/17 2240 08/23/17 0253  AST 23 72*  ALT 27 81*  ALKPHOS 74 87  BILITOT 0.9 1.2  PROT 6.7 6.7  ALBUMIN 3.8 3.0*   Recent Labs    08/23/17 0253  APTT 37*  INR 1.16   PHYSICAL EXAM Temp:  [98.6 F (37 C)-102.3 F (39.1 C)] 98.6 F (37 C) (01/17 1233) Pulse Rate:  [87-107] 87 (01/17 1233) Resp:  [18-51] 26 (01/17 1233) BP: (114-160)/(59-91) 155/59 (01/17 1015) SpO2:  [95 %-99  %] 98 % (01/17 1233) Weight:  [103.8 kg (228 lb 14.4 oz)] 103.8 kg (228 lb 14.4 oz) (01/17 0356) General - Well nourished, well developed elderly Caucasian male, not in acute distress, breathing improved from yesterday but still has obstructive sleep apnea breathing pattern HEENT-  Normocephalic,    Cardiovascular - Regular rate and rhythm  Neurological Exam : Mental status: eyes closed, briefly open to voice, orientated to self and people but not to time or place. Severe dysarthria with intelligible words..  Cranial nerves: Pupils equal round reactive to light but against eye opening with me. No significant facial drop on exam. Motor exam: b/l upper extremity moving equally, against gravity, not following commands, in mittens bilaterally, 3/5 left lower extremity, 4/5 right lower extremities on pain stimulation. DTR 1+ and not cooperative on babinski testing. Sensory and coordination and gait not tested  IMAGING: I  have personally reviewed the radiological images below and agree with the radiology interpretations. Ct Head Wo Contrast Result Date: 08/17/2017 IMPRESSION: 1. Decreased right hydrocephalus and improved right to left midline shift, now 1 mm previously 9 mm. Relatively unchanged large right temporal intraparenchymal hemorrhage and tiny parafalcine subdural hematoma. Small amount of left intraventricular hemorrhage now noted. 2. Atrophy, chronic small-vessel white matter ischemic changes and remote infarcts.   Ct Head Code Stroke Wo Contrast Result Date: 08/16/2017 IMPRESSION: 1. Intraparenchymal hematoma in the anterior right temporal lobe with volume of 11 cubic cm with associated intraventricular extension filling the right lateral ventricle temporal and occipital horns. 2. Leftward midline shift measuring 5-9 mm with early/impending subfalcine herniation of the anterior cingulate gyrus.   Echocardiogram:       02/14/2017                                        Study Conclusions - Left  ventricle: The cavity size was normal. Systolic function was   normal. The estimated ejection fraction was in the range of 55%   to 60%. - Aortic valve: There was trivial regurgitation. - Mitral valve: Calcified annulus. Mildly thickened leaflets .   There was mild regurgitation.  PORTABLE CHEST 1 VIEW IMPRESSION: No active disease. 08/20/2017   PORTABLE CHEST 1 VIEW       08/23/2017   IMPRESSION: IMPRESSION: New mild hazy left lung base opacity, which could represent atelectasis, aspiration and/or pneumonia. Recommend attention on follow-up chest radiographs.  Ct Head Wo Contrast Result Date: 08/17/2017 IMPRESSION: 1. Evolving RIGHT basal ganglia/temporal lobe hemorrhage with intraventricular extension. Similar 3 mm RIGHT to LEFT midline shift. No hydrocephalus. 2. Acute trace parafalcine subdural hematoma. Evolving acute RIGHT cerebral micro hemorrhages seen with amyloid angiopathy. 3. Multiple old large vascular territory and small vessel infarcts. Electronically Signed   By: Awilda Metro M.D.   On: 08/17/2017 22:49   Ct Head Wo Contrast Result Date: 08/17/2017 IMPRESSION: 1. Decreased right hydrocephalus and improved right to left midline shift, now 1 mm previously 9 mm. Relatively unchanged large right temporal intraparenchymal hemorrhage and tiny parafalcine subdural hematoma. Small amount of left intraventricular hemorrhage now noted. 2. Atrophy, chronic small-vessel white matter ischemic changes and remote infarcts. Electronically Signed   By: Harmon Pier M.D.   On: 08/17/2017 11:11   Ct Head Wo Contrast Result Date: 08/19/2017  IMPRESSION: Stable hematoma in right basal ganglia and intraventricular hemorrhage in the right greater than left lateral ventricles. Stable associated edema and mass effect with 3 mm right-to-left midline shift. No new acute intracranial abnormality identified. Electronically Signed   By: Mitzi Hansen M.D.   On: 08/19/2017 13:07    Ct  Head Wo Contrast Result Date: 08/23/2017 IMPRESSION: 1. Slowly evolving right temporal lobe parenchymal hemorrhage with stable to minimally decreased parenchymal and intraventricular blood volume. 2. Stable ventricle size.  Stable mild intracranial mass effect. 3. No new intracranial abnormality.    IMPRESSION: Mr. [name] is a 84 y.o. male with PMH of stroke without residual, A. fib on Eliquis admitted for left-sided weakness headache and right-sided gaze, left-sided neglect.  CT showed right basal ganglia and temporal lobe ICH with IVH, as well as chronic left MCA infarct.  Status post Kcentra reversed.  Repeat CT x 2 showed stable hemorrhage and midline shift as well as IVH.  MRI not able to be done due  to agitation.  EF 55-60%.  LDL 110 and A1c 5.8.  Large Right temporal intraparenchymal hemorrhage and tiny parafalcine subdural hematoma.  Suspected Etiology: Likely HTN hemorrhage  setting of underlying coagulopathy by Eliquis. Resultant Symptoms: fixed rightward gaze, left upper extremity plegia, left lower extremity paresis, dense sensory loss and neglect of the left side. Stroke Risk Factors: atrial fibrillation, hyperlipidemia and hypertension Other Stroke Risk Factors: Advanced age, Hx stroke  Outstanding Stroke Work-up Studies:    Work up completed at this time  08/21/16: He a little more awake alert but still had intermittent agitation, on restraints.  Na 150, off 3% saline and salt tablet.  cortrak clogged on IVF. K low with supplement via PICC line.   Leukocytosis stable today. CXR and UA unremarkable. Continue Rocephin. Palliative care on board. Need cortrak revision tomorrow. I had long discussion with daughters at bedside, updated pt current condition, treatment plan and potential poor prognosis. They are very reasonable and agree with comfort care if declines.   08/22/2017: Neuro exam stable with left sided weakness. Medicated multiple times overnight with  Haldol. Remains confused. Family at bedside. Pt with history of OSA with no CPAP since hospitalization. Multiple episodes of Urinary retention reported by nursing. No leukocytosis today and remains afebrile.   08/23/2017 ASSESSMENT:   Neuro exam unchanged. Per nursing patient now afebrile. IV antibiotics in progress. No sedation given overnight except one dose of Toradol. Family supportive at bedside and very hopeful for meaningful recovery. No reports of desats with snoring.  PLAN  08/23/2017: Frequent neuro checks Telemetry monitoring PT/OT/SLP Consult PM & Rehab - awaiting decision CIR vs SNF Consult Case Management /MSW Ongoing aggressive stroke risk factor management Patient's family counseled to be compliant with his antithrombotic medications Patient's family counseled on Lifestyle modifications including, Diet, Exercise, and Stress Follow up with [facility] in 6 weeks  HX OF STROKES: remote left frontoparietal, right MCA and left cerebellar infarcts  MEDICAL ISSUES: Sepsis - Probable Aspiration Pneumonia Afebrile at this time, WBC trending down Vanc and Zosyn in progress Hospitalists following, Appreciate assistance   DYSPHAGIA: Passed SLP swallow evaluation but SLP would like patient to be more awake prior to feeding Continue Cortrek and resume TF's when possible Aspiration Precautions in progress  CEREBRAL EDEMA: 3% NS Infusion Discontinued Na 155 today  Free water flushes resumed  Last Head CT 1/13 with stable findings Will continue to follow closely  AFIB, CHRONIC: Hx of atrial fibrillation on Eliquis last dose taken the morning of 08/16/2017 reversal of eliquis with K-centra per pharmacy protocol and d/c Eliquis Rate control  Hypertensive Encephalopathy B/P's stable overnight Will continue to monitor closely  AKI- Resolved Free water flushes in progress  Anemia on Admission-Resolved Will continue to monitor  Hypernatremia - Na 155  today IVF's discontinued Resumed Free water flushes Will continue to monitor closely Repeat labs in AM  Hypokalemia - Resolved Replaced Repeat labs in AM  Hypo Calcium Replaced, remains stable, will continue to monitor  OSA w/CPAP RT consulted Continuous O2 sat in progress No need for Oxygen supplementation thus far  Likely Baseline Dementia with sundowning Discontinued Seroquel due to decreased mentation Family to be at bedside tonight Ativan, Haldol and Toradol discontinued Will continue to monitor closely Encourage family to stay with patient at all times  BPH with Urinary Retention Home meds restarted Discontinue foley when possible Foley reinserted 1/16 due to urinary retention Extra dose of Flomax given 1/16  UTI Urine culture showed Klebsiella, sensitive to  rocephin.  Will continue Rocephin for total 7 days (day 5 today).  Repeat urinalysis 1/16 negative  HYPERTENSION: Stable. Episode of elevated B/P this AM due to no B/P meds due to no Cortrek PRN Labatelol IV given with good effects BP goal: 140 SBP range Norvasc and Cozaar started Long term BP goal normotensive. Home Meds: NONE  HYPERLIPIDEMIA:    Component Value Date/Time   CHOL 197 08/18/2017 2137   TRIG 163 (H) 08/20/2017 0517   HDL 61 08/18/2017 2137   CHOLHDL 3.2 08/18/2017 2137   VLDL 26 08/18/2017 2137   LDLCALC 110 (H) 08/18/2017 2137  Home Meds:  NONE LDL  goal < 70 Started on Lipitor 20 mg daily Continue statin at discharge  R/O DIABETES: Lab Results  Component Value Date   HGBA1C 5.8 (H) 08/18/2017  HgbA1c goal < 7.0  Other Active Problems: Principal Problem:   Hemorrhage of right temporal lobe (HCC) Active Problems:   Hydrocephalus   Intraventricular hemorrhage (HCC)   Subdural hematoma (HCC)   Dysarthria due to acute stroke (HCC)   Bacteriuria with pyuria   Advance care planning   Goals of care, counseling/discussion   Palliative care by specialist   Sepsis due to  pneumonia (HCC)   Intracranial hemorrhage (HCC)   Atrial fibrillation (HCC)   Hypernatremia    Hospital day # 7 VTE prophylaxis:SCD's  Diet : Diet NPO time specified   FAMILY UPDATES: No family at bedside  TEAM UPDATES: Marvel Plan, MD STATUS:  DNR     Prior Home Stroke Medications:  aspirin 81 mg daily and Eliquis (apixaban) daily  Discharge Stroke Meds:  Please discharge patient on TBD   Disposition: 01-Home or Self Care Therapy Recs:               CIR vs SNF Home Equipment:         PENDING Follow Up:  Follow-up Information    Micki Riley, MD. Schedule an appointment as soon as possible for a visit in 6 week(s).   Specialties:  Neurology, Radiology Contact information: [address] [place] [address] [phone]          [facility] Follow up in 1-2 weeks       Brita Romp Stroke Neurology Team 08/23/2017 1:19 PM  Attending note:  I have personally examined this patient, reviewed notes, independently viewed imaging studies, participated in medical decision making and plan of care. I have made any additions or clarifications directly to the above note. Agree with note above.   Pt had overnight event with spiking fever, up to 102.3, with intermittent agitation, received ativan over night.neurology and medicine on board, started on zosyn and vanco, discontinued rocephin. Put on IVF, stopped TF. Na this am 155. Pt sill has intermittent agitation, but open eyes on voice, not following commands but mumbling words. No significant neuro changes. Daughters at bedside, will continue current care and observe. Appreciate palliative following. Still has OSA pattern breathing, BP stable.   Marvel Plan, MD PhD Stroke Neurology 08/23/2017 7:00 PM  I spent  35 minutes in total face-to-face time with the patient, more than 50% of which was spent in counseling and coordination of care, reviewing test results, images and  medication, and discussing the diagnosis of fever, sepsis, agitation, ICH, treatment plan and potential prognosis. This patient's care requiresreview of multiple databases, neurological assessment, discussion with family, other specialists and medical decision making of high complexity.  To contact Stroke Continuity provider, please refer to [email or web address] After hours, contact General Neurology

## 2017-08-23 NOTE — Progress Notes (Signed)
Per Dr. Roda ShuttersXu, we will hold off on re-starting tube feeds at this time. Continue with just water flushes and all meds through tube.

## 2017-08-23 NOTE — Progress Notes (Signed)
Nutrition Follow-up  DOCUMENTATION CODES:   Not applicable  INTERVENTION:  Recommend advance cortrak tube to post pyloric. Will prevent any possibility of aspiration PNA from tubefeeding.  If tubefeed is restarted, recommend begin Jevity 1.2 at 8460mL/hr, Pro-stat 30mL BID  Provides 1928 calories, 110 gm protein, 1162mL free water  Continue 200mL free water every 4 hours per MD, provides 2362mL total free water  NUTRITION DIAGNOSIS:   Inadequate oral intake related to inability to eat, lethargy/confusion as evidenced by NPO status. -ongoing  GOAL:   Patient will meet greater than or equal to 90% of their needs -not meeting currently  MONITOR:   Diet advancement, Weight trends, I & O's, TF tolerance  ASSESSMENT:   84 y/o male PMHx CVA w/o deficit, Afib, GERD. Developed AMS and Left sided weakness at [facility]. EMS found pt to have elevated BP. Imaging in ED revealed ICH w/ IVH. RD consulted for small bore NGT placement and initiation of TF.   Discussed with RN Ginger. Patient's TF was turned off last night due to possible aspiration PNA noted on CXray, patient febrile overnight.  Labs reviewed:  Na 155  Medications reviewed and include:  Senokot-S   Intake/Output Summary (Last 24 hours) at 08/23/2017 1357 Last data filed at 08/23/2017 1201 Gross per 24 hour  Intake 845 ml  Output 1300 ml  Net -455 ml   Diet Order:  Diet NPO time specified  EDUCATION NEEDS:   No education needs have been identified at this time  Skin:  Skin Assessment: Skin Integrity Issues: Skin Integrity Issues:: Other (Comment) Other: Ecchymosis to L and R arm  Last BM:  08/21/2017  Height:   Ht Readings from Last 1 Encounters:  08/16/17 5\' 11"  (1.803 m)    Weight:   Wt Readings from Last 1 Encounters:  08/23/17 228 lb 14.4 oz (103.8 kg)    Ideal Body Weight:  78.18 kg  BMI:  Body mass index is 31.93 kg/m.  Estimated Nutritional Needs:   Kcal:  1850-2100 (23-26 kcal/kg  bw)  Protein:  97g-113g Pro (1.2-1.4 g/kg bw)  Fluid:  >2 L (25 ml/kg bw)   Gregory AnoWilliam M. Lylliana Kitamura, MS, RD LDN Inpatient Clinical Dietitian Pager [phone]

## 2017-08-23 NOTE — Progress Notes (Signed)
Daily Progress Note   Patient Name: [name]       Date: 08/23/2017 DOB: [birth year]  Age: 84 y.o. MRN#: [id number] Attending Physician: Marvel Plan, MD Primary Care Physician: [facility] Admit Date: 08/16/2017  Reason for Consultation/Follow-up: Establishing goals of care  Subjective: Events of overnight noted. Family at bedside. Patient noted to have increased respirations, opens eyes to voice. Not verbalizing.  Family wishes to continue antibiotics and current plan of care for next 24-48 hours and see how patient progresses. He is DNR/DNI.   ROS  Length of Stay: 7  Current Medications: Scheduled Meds:  .  stroke: mapping our early stages of recovery book   Does not apply Once  . amLODipine  10 mg Oral Daily  . atorvastatin  20 mg Oral QPM  . Chlorhexidine Gluconate Cloth  6 each Topical Q0600  . feeding supplement (PRO-STAT SUGAR FREE 64)  30 mL Per Tube Daily  . finasteride  5 mg Oral Daily  . free water  200 mL Per Tube Q4H  . ipratropium-albuterol  3 mL Nebulization Q6H  . lidocaine  1 patch Transdermal Q24H  . losartan  100 mg Oral Daily  . mouth rinse  15 mL Mouth Rinse Q8H  . mupirocin ointment  1 application Nasal BID  . pantoprazole  40 mg Oral Daily  . senna-docusate  2 tablet Oral BID  . sertraline  25 mg Oral Daily  . sodium chloride flush  10-40 mL Intracatheter Q12H  . tamsulosin  0.4 mg Oral QHS    Continuous Infusions: . piperacillin-tazobactam (ZOSYN)  IV 3.375 g (08/23/17 1357)  . vancomycin Stopped (08/23/17 0739)    PRN Meds: acetaminophen **OR** acetaminophen (TYLENOL) oral liquid 160 mg/5 mL **OR** acetaminophen, labetalol, sodium chloride flush  Physical Exam  Constitutional: He appears well-developed and  well-nourished.  Cardiovascular: Normal rate and regular rhythm.  Pulmonary/Chest:  tachypneic     Neurological:  Lethargic, opens eyes but not verbalizing  Skin: There is pallor.  Nursing note and vitals reviewed.           Vital Signs: BP (!) 155/59 (BP Location: Left Arm)   Pulse 87   Temp 98.6 F (37 C) (Oral)   Resp (!) 26   Ht 5\' 11"  (1.803 m)   Wt 103.8 kg (  228 lb 14.4 oz)   SpO2 98%   BMI 31.93 kg/m  SpO2: SpO2: 98 % O2 Device: O2 Device: Nasal Cannula O2 Flow Rate: O2 Flow Rate (L/min): 2 L/min  Intake/output summary:   Intake/Output Summary (Last 24 hours) at 08/23/2017 1546 Last data filed at 08/23/2017 1403 Gross per 24 hour  Intake 1045 ml  Output 1300 ml  Net -255 ml   LBM: Last BM Date: 08/21/17 Baseline Weight: Weight: 89 kg (196 lb 3.4 oz) Most recent weight: Weight: 103.8 kg (228 lb 14.4 oz)       Palliative Assessment/Data: PPS: 20%    Flowsheet Rows     Most Recent Value  Intake Tab  Referral Department  Hospitalist  Unit at Time of Referral  Med/Surg Unit  Palliative Care Primary Diagnosis  Neurology  Date Notified  08/20/17  Palliative Care Type  New Palliative care  Reason for referral  Clarify Goals of Care  Date of Admission  08/16/17  Date first seen by Palliative Care  08/21/17  # of days Palliative referral response time  1 Day(s)  # of days IP prior to Palliative referral  4  Clinical Assessment  Psychosocial & Spiritual Assessment  Palliative Care Outcomes      Patient Active Problem List   Diagnosis Date Noted  . Sepsis due to pneumonia (HCC)   . Intracranial hemorrhage (HCC)   . Atrial fibrillation (HCC)   . Hypernatremia   . Advance care planning   . Goals of care, counseling/discussion   . Palliative care by specialist   . Hemorrhage of right temporal lobe (HCC) 08/18/2017  . Subdural hematoma (HCC) 08/17/2017  . Dysarthria due to acute stroke (HCC) 08/17/2017  . Bacteriuria with pyuria 08/17/2017  .  Intraventricular hemorrhage (HCC) 08/16/2017  . Left shoulder pain 02/17/2017  . 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 Hydrocephalus 02/13/2017  . BPH (benign prostatic hyperplasia) 05/01/2016  . Paroxysmal atrial fibrillation (HCC) 05/01/2016  . Hyperlipidemia, unspecified 05/01/2016    Palliative Care Assessment & Plan   Patient Profile: 84 y.o. male  with past medical history of a fib on Eliquis, CVA 02/2017,  admitted on 08/16/2017 with L facial droop, L sided weakness, found to have large R temporal hematoma with intraventricular hemorrhage. Most recent CT on 1/13 showed 3mm R to left midline shift. Has had some changing breathing patterns that have been concerning, periods of apnea- chest xray is clear. Has NG tube in place- currently clogged- plan for revision tomorrow. Has not completed SLP eval. Showing slight improvements daily in mental status, but overall prognosis currently unknown, could be poor. Palliative medicine consulted for GOC.  Assessment/Recommendations/Plan   Continue current care- PMT will continue to follow and support family- they are aware of possibility that patient may not recover- if patient's respiratory status worsens or fails to improve over 48hrs they are considering comfort care  Goals of Care and Additional Recommendations:  Limitations on Scope of Treatment: Full Scope Treatment  Code Status:  DNR  Prognosis:   Unable to determine  Discharge Planning:  To Be Determined  Care plan was discussed with patient's daughters.   Thank you for allowing the Palliative Medicine Team to assist in the care of this patient.   Time In: 1515 Time Out: 1550 Total Time 35 mins Prolonged Time Billed no      Greater than 50%  of this time was spent counseling and coordinating care related to the above assessment  and plan.  Ocie Bob, AGNP-C Palliative Medicine   Please contact Palliative Medicine Team phone at  [phone] for questions and concerns.

## 2017-08-23 NOTE — Progress Notes (Signed)
Agree with assessment plan as per Dr. Antionette Charpyd who consulted on this patient earlier today  5083 A. fib chads score >2, prior CVA 02/13/2017-left eye affected-at that admission E. coli UTI, GERD, bipolar, left carotid endarterectomy 2010, prior subdural hematoma 2013, possible baseline dementia, BPH with retention Admitted acute code stroke Noble fixed right right gaze left upper extremity plegia as well as lower and neglect left side--Eliquis was reversed with Armandina StammerK Centra and he had cerebral edema so hyperosmolar therapy was recommended--cor track was placed for feeds-during hospital stay found to have hypertensive encephalopathy likely being the cause of hemorrhage while on Eliquis was hyponatremic secondary to management of cerebral edema and has been confused--palliative care was consulted and family is aware of poor prognosis if something were to transpire Patient was progressing until 1/17 a.m. when had temperature of 38.9 Celsius tachycardia tachypnea chest x-ray revealed left-sided pneumonia Started on appropriate antibiotics   BP (!) 155/59 (BP Location: Left Arm)   Pulse 89   Temp 99 F (37.2 C) (Axillary)   Resp (!) 22   Ht 5\' 11"  (1.803 m)   Wt 103.8 kg (228 lb 14.4 oz)   SpO2 99%   BMI 31.93 kg/m  o/e Drowsy with nasal cannula in, some pauses his breathing, S1-S2 no murmur rub or gallop, chest has mild crackles Abdomen soft nontender  Agree with plan as per my partner I would continue antibiotics-if patient does not improve over 24-48 hours giving some time for the antibiotics to work, family concurs that the debilitating stroke may be there than that takes him from them I will follow peripherally and make recommendations as needed and thank you for the consult

## 2017-08-23 NOTE — Progress Notes (Signed)
SLP Cancellation Note  Patient Details Name: Mickie BailGeorge Thomas Kall MRN: [id number] DOB: 03/05/1934   Cancelled treatment:       Reason Eval/Treat Not Completed: Patient's level of consciousness. Performed oral care and discussed pt with family. He will open eyes, but is still quite delirious. Not appropriate for SLP assessment today. Ok to call if pt improves later in the day.   Harlon DittyBonnie Agam Tuohy, [place] CCC-SLP [phone]  [name], Yizel Canby Caroline 08/23/2017, 10:32 AM

## 2017-08-23 NOTE — Progress Notes (Signed)
Interim note  Called by nurse around 1 am the patient was febrile. Temperature was 102.1.  Tylenol was administered per patient continued to remain febrile. Patient also was tachypneic and noted to be wheezing.  On assessing the patient, the patient appeared to be less responsive and not speaking. Patient was satting at 95% and blood pressure was 150 systolic.  Obtain stat chest x-ray showed left lower lobe consolidation possibly pneumonia. Start patient on vancomycin and Zosyn for broad-spectrum coverage and to empirically cover for aspiration pneumonia. Also order blood cultures, lactic acid. CT head ordered. Spoke to daughter and confirmed patient would not want to be intubated and is DNR/DNI  Called medicine team, appreciate and will follow recommendations.      Georgiana SpinnerSushanth Tristain Daily MD [facility] [phone]  If 7pm to 7am, please call on call as listed on AMION.

## 2017-08-23 NOTE — Progress Notes (Addendum)
Dr. Laurence SlateAroor notified temp 102 ax, 103.1 oral; RR-51. BP/O2 stable. Tylenol administered as directed PRN. Daughter at bedside. Will continue to monitor.

## 2017-08-23 NOTE — Progress Notes (Addendum)
Accompanied transport to CT due to change in status overnight. CCMD notified. Remained stable throughout transport and during scan. Returned to room @ approx 7:50am. Family remains at bedside; updated on POC. Pt remains lethargic/difficult to arouse. Bedside report given to oncoming RN and pt left in her care at this time.

## 2017-08-24 LAB — BASIC METABOLIC PANEL
Anion gap: 12 (ref 5–15)
BUN: 36 mg/dL — ABNORMAL HIGH (ref 6–20)
CHLORIDE: 117 mmol/L — AB (ref 101–111)
CO2: 23 mmol/L (ref 22–32)
CREATININE: 1.4 mg/dL — AB (ref 0.61–1.24)
Calcium: 8.6 mg/dL — ABNORMAL LOW (ref 8.9–10.3)
GFR calc non Af Amer: 46 mL/min — ABNORMAL LOW (ref >60)
GFR, EST AFRICAN AMERICAN: 53 mL/min — AB (ref >60)
GLUCOSE: 145 mg/dL — AB (ref 65–99)
Potassium: 3.3 mmol/L — ABNORMAL LOW (ref 3.5–5.1)
Sodium: 152 mmol/L — ABNORMAL HIGH (ref 135–145)

## 2017-08-24 LAB — GLUCOSE, CAPILLARY
GLUCOSE-CAPILLARY: 161 mg/dL — AB (ref 65–99)
GLUCOSE-CAPILLARY: 125 mg/dL — AB (ref 65–99)
GLUCOSE-CAPILLARY: 147 mg/dL — AB (ref 65–99)
Glucose-Capillary: 132 mg/dL — ABNORMAL HIGH (ref 65–99)
Glucose-Capillary: 121 mg/dL — ABNORMAL HIGH (ref 65–99)

## 2017-08-24 LAB — CBC
HEMATOCRIT: 41.4 % (ref 39.0–52.0)
HEMOGLOBIN: 13.3 g/dL (ref 13.0–17.0)
MCH: 31.3 pg (ref 26.0–34.0)
MCHC: 32.1 g/dL (ref 30.0–36.0)
MCV: 97.4 fL (ref 78.0–100.0)
Platelets: 218 10*3/uL (ref 150–400)
RBC: 4.25 MIL/uL (ref 4.22–5.81)
RDW: 14.2 % (ref 11.5–15.5)
WBC: 10.6 10*3/uL — ABNORMAL HIGH (ref 4.0–10.5)

## 2017-08-24 LAB — LEGIONELLA PNEUMOPHILA SEROGP 1 UR AG: L. PNEUMOPHILA SEROGP 1 UR AG: NEGATIVE

## 2017-08-24 LAB — MAGNESIUM: Magnesium: 2.7 mg/dL — ABNORMAL HIGH (ref 1.7–2.4)

## 2017-08-24 LAB — PROCALCITONIN

## 2017-08-24 LAB — SODIUM
SODIUM: 152 mmol/L — AB (ref 135–145)
Sodium: 150 mmol/L — ABNORMAL HIGH (ref 135–145)

## 2017-08-24 LAB — PHOSPHORUS: PHOSPHORUS: 6.4 mg/dL — AB (ref 2.5–4.6)

## 2017-08-24 MED ORDER — IPRATROPIUM-ALBUTEROL 0.5-2.5 (3) MG/3ML IN SOLN
3.0000 mL | Freq: Two times a day (BID) | RESPIRATORY_TRACT | Status: DC
  Administered 2017-08-25: 3 mL via RESPIRATORY_TRACT
  Filled 2017-08-24: qty 3

## 2017-08-24 MED ORDER — SODIUM CHLORIDE 0.9% FLUSH: 10.0000 mL | INTRAVENOUS | Status: DC | PRN

## 2017-08-24 MED ORDER — MORPHINE SULFATE (PF) 2 MG/ML IV SOLN
2.0000 mg | Freq: Once | INTRAVENOUS | Status: AC
  Administered 2017-08-24: 2 mg via INTRAVENOUS
  Filled 2017-08-24: qty 1

## 2017-08-24 MED ORDER — MORPHINE SULFATE (PF) 4 MG/ML IV SOLN
2.0000 mg | INTRAVENOUS | Status: DC | PRN
  Administered 2017-08-24: 2 mg via INTRAVENOUS
  Filled 2017-08-24: qty 1

## 2017-08-24 NOTE — Progress Notes (Signed)
Cortrak Tube Team Note:  Cortrak team consulted due to clogged feeding tube.   On RD arrival, the tube would flush, but required much pressure. Tube felt to be minimally patent. Opted to replace.   A 10 F Cortrak tube was placed in the L nare and secured with a nasal bridle at 95 cm. Per the Cortrak monitor reading the tube tip is located in distal duodenum, approximately D3/D4.   No x-ray is required. RN may begin using tube.    If the tube becomes dislodged please keep the tube and contact the Cortrak team at [email or web address] ([email or web address]) for replacement.  If after hours and replacement cannot be delayed, place a NG tube and confirm placement with an abdominal x-ray.   Christophe LouisNathan Faraaz Wolin RD, LDN, CNSC Clinical Nutrition Pager: [phone] 08/24/2017 1:11 PM

## 2017-08-24 NOTE — Progress Notes (Signed)
NEUROHOSPITALISTS STROKE TEAM - DAILY PROGRESS NOTE   SUBJECTIVE (INTERVAL HISTORY)  Two daughters are at bedside. Intermittent high grade fever overnight, no significant change of mentation but more lethargic and less agitation today. Eyes open but not interactive. Still on abx, Na improved on free water. BP stable with BP meds. Palliative care on board, family waiting for another sister to come this pm. Likely comfort care tomorrow.     OBJECTIVE Lab Results: CBC:  Recent Labs  Lab 08/22/17 0448 08/23/17 0253 08/24/17 0401  WBC 9.6 11.0* 10.6*  HGB 13.0 13.9 13.3  HCT 40.9 43.3 41.4  MCV 96.7 97.3 97.4  PLT 232 242 218   BMP: Recent Labs  Lab 08/18/17 0528 08/18/17 0638 08/18/17 2137 08/19/17 0318  08/20/17 0517 08/21/17 0500 08/22/17 0448 08/22/17 1911 08/23/17 0253 08/23/17 1433 08/24/17 0401  NA 145 146* 147*  146* 148*   < > 152* 150* 158* 156* 155* 155* 152*  K 3.0* 3.2*  --   --   --  3.3* 2.9* 3.3*  --  3.8  --  3.3*  CL 116* 115*  --   --   --  121* 114* 127*  --  124*  --  117*  CO2 22 23  --   --   --  24 22 21*  --  20*  --  23  GLUCOSE 110* 126*  --   --   --  175* 310* 106*  --  128*  --  145*  BUN 9 9  --   --   --  29* 33* 39*  --  37*  --  36*  CREATININE 0.79 0.81  --   --   --  0.84 0.72 0.95  --  1.20  --  1.40*  CALCIUM 8.5* 8.9  --   --   --  9.3 7.6* 8.5*  --  8.7*  --  8.6*  MG 1.9 2.0 2.2 2.1  --   --   --   --   --   --   --  2.7*  PHOS 2.4* 2.5 2.6 2.6  --   --   --   --   --   --   --  6.4*   < > = values in this interval not displayed.   Liver Function Tests:  Recent Labs  Lab 08/23/17 0253  AST 72*  ALT 81*  ALKPHOS 87  BILITOT 1.2  PROT 6.7  ALBUMIN 3.0*   Recent Labs    08/23/17 0253  APTT 37*  INR 1.16   PHYSICAL EXAM Temp:  [98.2 F (36.8 C)-101.8 F (38.8 C)] 101.2 F (38.4 C) (01/18 1117) Pulse Rate:  [82-107] 97 (01/18 1117) Resp:  [19-44] 19 (01/18  1117) BP: (117-164)/(58-73) 147/71 (01/18 1117) SpO2:  [97 %-100 %] 99 % (01/18 1117) Weight:  [233 lb 0.4 oz (105.7 kg)] 233 lb 0.4 oz (105.7 kg) (01/18 0503) General - Well nourished, well developed elderly Caucasian male, not in acute distress, breathing improved from yesterday but still has obstructive sleep apnea breathing pattern HEENT-  Normocephalic,    Cardiovascular - Regular rate and rhythm  Neurological Exam : Mental status: eyes closed, briefly open to voice, orientated to self and people but not to time or place. Severe dysarthria with intelligible words..  Cranial nerves: Pupils equal round reactive to light but against eye opening with me. No significant facial drop on exam. Motor exam: b/l upper extremity moving equally,  against gravity, not following commands, in mittens bilaterally, 3/5 left lower extremity, 4/5 right lower extremities on pain stimulation. DTR 1+ and not cooperative on babinski testing. Sensory and coordination and gait not tested  IMAGING:  I have personally reviewed the radiological images below and agree with the radiology interpretations.  Ct Head Wo Contrast Result Date: 08/17/2017 IMPRESSION:  1. Decreased right hydrocephalus and improved right to left midline shift, now 1 mm previously 9 mm. Relatively unchanged large right temporal intraparenchymal hemorrhage and tiny parafalcine subdural hematoma. Small amount of left intraventricular hemorrhage now noted.  2. Atrophy, chronic small-vessel white matter ischemic changes and remote infarcts.   Ct Head Code Stroke Wo Contrast Result Date: 08/16/2017 IMPRESSION:  1. Intraparenchymal hematoma in the anterior right temporal lobe with volume of 11 cubic cm with associated intraventricular extension filling the right lateral ventricle temporal and occipital horns.  2. Leftward midline shift measuring 5-9 mm with early/impending subfalcine herniation of the anterior cingulate gyrus.   Echocardiogram:         02/14/2017                                        Study Conclusions - Left ventricle: The cavity size was normal. Systolic function was   normal. The estimated ejection fraction was in the range of 55%   to 60%. - Aortic valve: There was trivial regurgitation. - Mitral valve: Calcified annulus. Mildly thickened leaflets .   There was mild regurgitation.  PORTABLE CHEST 1 VIEW IMPRESSION: No active disease. 08/20/2017   PORTABLE CHEST 1 VIEW       08/23/2017   IMPRESSION: New mild hazy left lung base opacity, which could represent atelectasis, aspiration and/or pneumonia. Recommend attention on follow-up chest radiographs.  Ct Head Wo Contrast Result Date: 08/17/2017 IMPRESSION: 1. Evolving RIGHT basal ganglia/temporal lobe hemorrhage with intraventricular extension. Similar 3 mm RIGHT to LEFT midline shift. No hydrocephalus. 2. Acute trace parafalcine subdural hematoma. Evolving acute RIGHT cerebral micro hemorrhages seen with amyloid angiopathy. 3. Multiple old large vascular territory and small vessel infarcts. Electronically Signed   By: Awilda Metro M.D.   On: 08/17/2017 22:49   Ct Head Wo Contrast Result Date: 08/17/2017 IMPRESSION: 1. Decreased right hydrocephalus and improved right to left midline shift, now 1 mm previously 9 mm. Relatively unchanged large right temporal intraparenchymal hemorrhage and tiny parafalcine subdural hematoma. Small amount of left intraventricular hemorrhage now noted. 2. Atrophy, chronic small-vessel white matter ischemic changes and remote infarcts. Electronically Signed   By: Harmon Pier M.D.   On: 08/17/2017 11:11   Ct Head Wo Contrast Result Date: 08/19/2017  IMPRESSION: Stable hematoma in right basal ganglia and intraventricular hemorrhage in the right greater than left lateral ventricles. Stable associated edema and mass effect with 3 mm right-to-left midline shift. No new acute intracranial abnormality identified. Electronically Signed    By: Mitzi Hansen M.D.   On: 08/19/2017 13:07    Ct Head Wo Contrast Result Date: 08/23/2017 IMPRESSION: 1. Slowly evolving right temporal lobe parenchymal hemorrhage with stable to minimally decreased parenchymal and intraventricular blood volume. 2. Stable ventricle size.  Stable mild intracranial mass effect. 3. No new intracranial abnormality.     IMPRESSION: Mr. [name] is a 84 y.o. male with PMH of stroke without residual, A. fib on Eliquis admitted for left-sided weakness headache and right-sided gaze, left-sided neglect.  CT showed  right basal ganglia and temporal lobe ICH with IVH, as well as chronic left MCA infarct.  Status post Kcentra reversed.  Repeat CT x 2 showed stable hemorrhage and midline shift as well as IVH.  MRI not able to be done due to agitation.  EF 55-60%.  LDL 110 and A1c 5.8.  Large Right temporal intraparenchymal hemorrhage and tiny parafalcine subdural hematoma.  Suspected Etiology: Likely HTN hemorrhage  setting of underlying coagulopathy by Eliquis. Resultant Symptoms: fixed rightward gaze, left upper extremity plegia, left lower extremity paresis, dense sensory loss and neglect of the left side. Stroke Risk Factors: atrial fibrillation, hyperlipidemia and hypertension Other Stroke Risk Factors: Advanced age, Hx stroke  Outstanding Stroke Work-up Studies:    Work up completed at this time  08/21/16: He a little more awake alert but still had intermittent agitation, on restraints.  Na 150, off 3% saline and salt tablet.  cortrak clogged on IVF. K low with supplement via PICC line.   Leukocytosis stable today. CXR and UA unremarkable. Continue Rocephin. Palliative care on board. Need cortrak revision tomorrow. I had long discussion with daughters at bedside, updated pt current condition, treatment plan and potential poor prognosis. They are very reasonable and agree with comfort care if declines.   08/22/2017: Neuro exam stable with  left sided weakness. Medicated multiple times overnight with Haldol. Remains confused. Family at bedside. Pt with history of OSA with no CPAP since hospitalization. Multiple episodes of Urinary retention reported by nursing. No leukocytosis today and remains afebrile.   08/24/2017 ASSESSMENT:   Neuro exam unchanged. Per nursing patient now afebrile. IV antibiotics in progress. No sedation given overnight except one dose of Toradol. Family supportive at bedside and very hopeful for meaningful recovery. No reports of desats with snoring.  PLAN  08/24/2017: Frequent neuro checks Telemetry monitoring PT/OT/SLP Consult PM & Rehab - awaiting decision CIR vs SNF Consult Case Management /MSW Ongoing aggressive stroke risk factor management Patient's family counseled to be compliant with his antithrombotic medications Patient's family counseled on Lifestyle modifications including, Diet, Exercise, and Stress Follow up with [facility] in 6 weeks  HX OF STROKES: remote left frontoparietal, right MCA and left cerebellar infarcts  MEDICAL ISSUES: Sepsis - Probable Aspiration Pneumonia Afebrile at this time, WBC trending down Vanc and Zosyn in progress Hospitalists following, Appreciate assistance   DYSPHAGIA: Passed SLP swallow evaluation but SLP would like patient to be more awake prior to feeding Continue Cortrek and resume TF's when possible Aspiration Precautions in progress  CEREBRAL EDEMA: 3% NS Infusion Discontinued Na 155 today  Free water flushes resumed  Last Head CT 1/13 with stable findings Will continue to follow closely  AFIB, CHRONIC: Hx of atrial fibrillation on Eliquis last dose taken the morning of 08/16/2017 reversal of eliquis with K-centra per pharmacy protocol and d/c Eliquis Rate control  Hypertensive Encephalopathy B/P's stable overnight Will continue to monitor closely  AKI- Resolved Free water flushes in progress  Anemia on  Admission-Resolved Will continue to monitor  Hypernatremia - Na 155 today IVF's discontinued Resumed Free water flushes Will continue to monitor closely Repeat labs in AM  Hypokalemia - Resolved Replaced Repeat labs in AM  Hypo Calcium Replaced, remains stable, will continue to monitor  OSA w/CPAP RT consulted Continuous O2 sat in progress No need for Oxygen supplementation thus far  Likely Baseline Dementia with sundowning Discontinued Seroquel due to decreased mentation Family to be at bedside tonight Ativan, Haldol and Toradol discontinued Will continue to  monitor closely Encourage family to stay with patient at all times  BPH with Urinary Retention Home meds restarted Discontinue foley when possible Foley reinserted 1/16 due to urinary retention Extra dose of Flomax given 1/16  UTI Urine culture showed Klebsiella, sensitive to rocephin.  Will continue Rocephin for total 7 days (day 5 today).  Repeat urinalysis 1/16 negative  HYPERTENSION: Stable. Episode of elevated B/P this AM due to no B/P meds due to no Cortrek PRN Labatelol IV given with good effects BP goal: 140 SBP range Norvasc and Cozaar started Long term BP goal normotensive. Home Meds: NONE  HYPERLIPIDEMIA:    Component Value Date/Time   CHOL 197 08/18/2017 2137   TRIG 163 (H) 08/20/2017 0517   HDL 61 08/18/2017 2137   CHOLHDL 3.2 08/18/2017 2137   VLDL 26 08/18/2017 2137   LDLCALC 110 (H) 08/18/2017 2137  Home Meds:  NONE LDL  goal < 70 Started on Lipitor 20 mg daily Continue statin at discharge  R/O DIABETES: Lab Results  Component Value Date   HGBA1C 5.8 (H) 08/18/2017  HgbA1c goal < 7.0  Other Active Problems: Principal Problem:   Hemorrhage of right temporal lobe (HCC) Active Problems:   Hydrocephalus   Intraventricular hemorrhage (HCC)   Subdural hematoma (HCC)   Dysarthria due to acute stroke (HCC)   Bacteriuria with pyuria   Advance care planning   Goals of care,  counseling/discussion   Palliative care by specialist   Sepsis due to pneumonia (HCC)   Intracranial hemorrhage (HCC)   Atrial fibrillation (HCC)   Hypernatremia   Encounter for feeding tube placement   Fever   Leukocytosis   SOB (shortness of breath)    Hospital day # 8 VTE prophylaxis:SCD's  Diet : Diet NPO time specified   FAMILY UPDATES: No family at bedside  TEAM UPDATES: Marvel Plan, MD STATUS:  DNR     Prior Home Stroke Medications:  aspirin 81 mg daily and Eliquis (apixaban) daily  Discharge Stroke Meds:  Please discharge patient on TBD   Disposition: 01-Home or Self Care Therapy Recs:               CIR vs SNF Home Equipment:         PENDING Follow Up:  Follow-up Information    Micki Riley, MD. Schedule an appointment as soon as possible for a visit in 6 week(s).   Specialties:  Neurology, Radiology Contact information: [address] [place] [address] [phone]          [facility] Follow up in 1-2 weeks        Attending note:  I have personally examined this patient, reviewed notes, independently viewed imaging studies, participated in medical decision making and plan of care. I have made any additions or clarifications directly to the above note. Agree with note above.   Pt continues to have spiking fever, up to 101.8, more lethargic today and less agitation, eyes open but not interactive, not following commands. Medicine on board, discontinued vanco and continued on. On free water and Na improved. CT yesterday no change. Appreciate palliative following. Family like to wait another daughter coming from [facility]. Likely comfort care tomorrow.   Marvel Plan, MD PhD Stroke Neurology 08/24/2017 5:08 PM   To contact Stroke Continuity provider, please refer to [email or web address]. After hours, contact General Neurology

## 2017-08-24 NOTE — Progress Notes (Signed)
Notified MD of patient's labored and tachypneic breathing of 44 per minute, ordered received for nebulizer treatment, neb given by RT rate now is 52. Will  Notify MD for further orders.

## 2017-08-24 NOTE — Progress Notes (Addendum)
Daily Progress Note   Patient Name: [name]       Date: 08/24/2017 DOB: [birth year]  Age: 84 y.o. MRN#: [id number] Attending Physician: Marvel PlanXu, Jindong, MD [facility] Care Phys[facility] Admit Date: 08/16/2017  Reason for Consultation/Follow-up: Establishing goals of care  Subjective: Patient continued to have respiratory difficulty overnight. Dr. Roda ShuttersXu in discussing possible poor prognosis with family. Noted he has had very little progress in recovering function over the last 8 days. Now with likely aspiration pneumonia.  Discussed likely trajectory of aspiration pneumonia recurring, recurrent UTI's.  Additionally, patient has high anxiety levels at night- this was present before his admission. Medication to alleviate anxiety and pain causes sedation, and patient is unable to eat, and aspirates. We discussed how these are affecting patient's recovery and quality of life.  Patient's daughter's are very clear that they do not want their father to have prolonged suffering.  [place] daughter coming [place]- [place] tonight.  Cipriano MileLeesa and [name] are feeling that with overall very little improvement in patient's function over the last few days, they are likely to transition to comfort measures tomorrow, after their sister has arrived from out of town.  Discussed what full transition to comfort care would look like. They would be interested in transfer to [facility] home if they choose this path.   Review of Systems  Unable to perform ROS: Acuity of condition    Length of Stay: 8  Current Medications: Scheduled Meds:  .  stroke: mapping our early stages of recovery book   Does not apply Once  . amLODipine  10 mg Oral Daily  . atorvastatin  20 mg  Oral QPM  . Chlorhexidine Gluconate Cloth  6 each Topical Q0600  . feeding supplement (PRO-STAT SUGAR FREE 64)  30 mL Per Tube Daily  . finasteride  5 mg Oral Daily  . free water  300 mL Per Tube Q4H  . ipratropium-albuterol  3 mL Nebulization Q6H  . lidocaine  1 patch Transdermal Q24H  . losartan  100 mg Oral Daily  . mouth rinse  15 mL Mouth Rinse Q8H  . mupirocin ointment  1 application Nasal BID  . pantoprazole  40 mg Oral Daily  . senna-docusate  2 tablet Oral BID  . sertraline  25 mg Oral Daily  .  sodium chloride flush  10-40 mL Intracatheter Q12H  . tamsulosin  0.4 mg Oral QHS    Continuous Infusions: . piperacillin-tazobactam (ZOSYN)  IV Stopped (08/24/17 0951)    PRN Meds: acetaminophen **OR** acetaminophen (TYLENOL) oral liquid 160 mg/5 mL **OR** acetaminophen, labetalol, morphine injection, sodium chloride flush  Physical Exam  Constitutional: He appears well-developed and well-nourished.  Cardiovascular: Normal rate and regular rhythm.  Pulmonary/Chest:  tachypneic at times     Neurological:  Lethargic, opens eyes but not verbalizing  Skin: There is pallor.  Nursing note and vitals reviewed.           Vital Signs: BP (!) 147/71 (BP Location: Left Arm)   Pulse 97   Temp (!) 101.2 F (38.4 C) (Axillary)   Resp 19   Ht 5\' 11"  (1.803 m)   Wt 105.7 kg (233 lb 0.4 oz)   SpO2 99%   BMI 32.50 kg/m  SpO2: SpO2: 99 % O2 Device: O2 Device: Nasal Cannula O2 Flow Rate: O2 Flow Rate (L/min): 3 L/min  Intake/output summary:   Intake/Output Summary (Last 24 hours) at 08/24/2017 1252 Last data filed at 08/24/2017 0000 Gross per 24 hour  Intake 200 ml  Output 2000 ml  Net -1800 ml   LBM: Last BM Date: 08/20/17 Baseline Weight: Weight: 89 kg (196 lb 3.4 oz) Most recent weight: Weight: 105.7 kg (233 lb 0.4 oz)       Palliative Assessment/Data: PPS: 10%    Flowsheet Rows     Most Recent Value  Intake Tab  Referral Department  Hospitalist  Unit at Time of  Referral  Med/Surg Unit  Palliative Care Primary Diagnosis  Neurology  Date Notified  08/20/17  Palliative Care Type  New Palliative care  Reason for referral  Clarify Goals of Care  Date of Admission  08/16/17  Date first seen by Palliative Care  08/21/17  # of days Palliative referral response time  1 Day(s)  # of days IP prior to Palliative referral  4  Clinical Assessment  Psychosocial & Spiritual Assessment  Palliative Care Outcomes      Patient Active Problem List   Diagnosis Date Noted  . Sepsis due to pneumonia (HCC)   . Intracranial hemorrhage (HCC)   . Atrial fibrillation (HCC)   . Hypernatremia   . Encounter for feeding tube placement   . Fever   . Leukocytosis   . SOB (shortness of breath)   . Advance care planning   . Goals of care, counseling/discussion   . Palliative care by specialist   . Hemorrhage of right temporal lobe (HCC) 08/18/2017  . Subdural hematoma (HCC) 08/17/2017  . Dysarthria due to acute stroke (HCC) 08/17/2017  . Bacteriuria with pyuria 08/17/2017  . Intraventricular hemorrhage (HCC) 08/16/2017  . Left shoulder pain 02/17/2017  . 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 Hydrocephalus 02/13/2017  . BPH (benign prostatic hyperplasia) 05/01/2016  . Paroxysmal atrial fibrillation (HCC) 05/01/2016  . Hyperlipidemia, unspecified 05/01/2016    Palliative Care Assessment & Plan   Patient Profile: 84 y.o. male  with past medical history of a fib on Eliquis, CVA 02/2017,  admitted on 08/16/2017 with L facial droop, L sided weakness, found to have large R temporal hematoma with intraventricular hemorrhage. Most recent CT on 1/13 showed 3mm R to left midline shift. Has had some changing breathing patterns that have been concerning, periods of apnea- chest xray is clear. Has NG tube in place- currently clogged- plan for  revision tomorrow. Has not completed SLP eval. Showing slight improvements daily in mental status,  but overall prognosis currently unknown, could be poor. Palliative medicine consulted for GOC.  Assessment/Recommendations/Plan   Not doing well- overall declining, not eating, respiratory status worsens at night  Family requests some comfort provided alongside of current level of care- they were pleased with how patient responded to morphine 2mg  IV that patient was given last night- noted it decreased RR, but did not sedate patient  Continue Morphine IV 2mg  q4hr prn for increased work of breathing, signs of agitation or pain  Family is very reasonable- anticipate possible transition to comfort care likely over the weekend  PMT will continue to follow  If transitioned to comfort care- family requests pt transferred to [facility] home  Goals of Care and Additional Recommendations:  Limitations on Scope of Treatment: Full Scope Treatment  Code Status:  DNR  Prognosis:   Unable to determine  Discharge Planning:  To Be Determined  Care plan was discussed with patient's daughters.   Thank you for allowing the Palliative Medicine Team to assist in the care of this patient.   Time In: 1230 Time Out: 1305 Total Time 35 mins Prolonged Time Billed no      Greater than 50%  of this time was spent counseling and coordinating care related to the above assessment and plan.  Ocie Bob, AGNP-C Palliative Medicine   Please contact Palliative Medicine Team phone at [phone] for questions and concerns.

## 2017-08-24 NOTE — Progress Notes (Addendum)
Brief follow up note  Patient asleep and no discernible change to mentation over 24 hours daughters waiting on another relative to come and they think their father Mr [name] is "waiting" to also say goodbye in a sense to this family member  Has some mild AKI and I did d/c vancomycin Would continue Losartan for now if able--do not think it will make a huge difference to overall poor prognosis--I would be very surprised if patient recovers from this--Would treat aspiration for 5-7 days--could convert to PO clinda or Augmentin via tube with 24 hours of being afebrile but has had a temp 101.6 int he past 24 hours--there eis no way of preventing aspiration and I feel patient should be NPO until we see some more awakefullness or alertness--otherwise remains high risk for recurrent aspiration even on his own secretions  I am available for specific questions, but otherwise would sign off.  Pleas KochJai Scottie Stanish, MD [facility] [phone]

## 2017-08-25 ENCOUNTER — Other Ambulatory Visit: Payer: Self-pay

## 2017-08-25 LAB — BASIC METABOLIC PANEL
Anion gap: 13 (ref 5–15)
BUN: 34 mg/dL — ABNORMAL HIGH (ref 6–20)
CO2: 23 mmol/L (ref 22–32)
Calcium: 8.5 mg/dL — ABNORMAL LOW (ref 8.9–10.3)
Chloride: 114 mmol/L — ABNORMAL HIGH (ref 101–111)
Creatinine, Ser: 1.12 mg/dL (ref 0.61–1.24)
GFR calc non Af Amer: 60 mL/min — ABNORMAL LOW (ref >60)
Glucose, Bld: 128 mg/dL — ABNORMAL HIGH (ref 65–99)
POTASSIUM: 3.4 mmol/L — AB (ref 3.5–5.1)
SODIUM: 150 mmol/L — AB (ref 135–145)

## 2017-08-25 LAB — GLUCOSE, CAPILLARY
GLUCOSE-CAPILLARY: 105 mg/dL — AB (ref 65–99)
GLUCOSE-CAPILLARY: 121 mg/dL — AB (ref 65–99)
Glucose-Capillary: 119 mg/dL — ABNORMAL HIGH (ref 65–99)

## 2017-08-25 LAB — CBC
HEMATOCRIT: 42.4 % (ref 39.0–52.0)
HEMOGLOBIN: 13.7 g/dL (ref 13.0–17.0)
MCH: 31.3 pg (ref 26.0–34.0)
MCHC: 32.3 g/dL (ref 30.0–36.0)
MCV: 96.8 fL (ref 78.0–100.0)
Platelets: 208 10*3/uL (ref 150–400)
RBC: 4.38 MIL/uL (ref 4.22–5.81)
RDW: 13.9 % (ref 11.5–15.5)
WBC: 11.5 10*3/uL — ABNORMAL HIGH (ref 4.0–10.5)

## 2017-08-25 MED ORDER — SCOPOLAMINE 1 MG/3DAYS TD PT72: 1.0000 | MEDICATED_PATCH | TRANSDERMAL | 12 refills | Status: AC

## 2017-08-25 MED ORDER — SODIUM CHLORIDE 0.9% FLUSH: 10.0000 mL | Freq: Two times a day (BID) | INTRAVENOUS | Status: AC

## 2017-08-25 MED ORDER — HEPARIN SOD (PORK) LOCK FLUSH 100 UNIT/ML IV SOLN
250.0000 [IU] | Freq: Every day | INTRAVENOUS | Status: DC
  Filled 2017-08-25: qty 2.5

## 2017-08-25 MED ORDER — ACETAMINOPHEN 160 MG/5ML PO SOLN: 650.0000 mg | ORAL | 0 refills | Status: AC | PRN

## 2017-08-25 MED ORDER — SODIUM CHLORIDE 0.9% FLUSH: 10.0000 mL | INTRAVENOUS | Status: AC | PRN

## 2017-08-25 MED ORDER — HEPARIN SOD (PORK) LOCK FLUSH 100 UNIT/ML IV SOLN
250.0000 [IU] | INTRAVENOUS | Status: DC | PRN
  Administered 2017-08-25: 250 [IU]
  Filled 2017-08-25 (×2): qty 3

## 2017-08-25 MED ORDER — WHITE PETROLATUM EX OINT
TOPICAL_OINTMENT | CUTANEOUS | Status: AC
  Administered 2017-08-25: 10:00:00
  Filled 2017-08-25: qty 28.35

## 2017-08-25 MED ORDER — MORPHINE BOLUS VIA INFUSION: 5.0000 mg | INTRAVENOUS | 0 refills | Status: AC | PRN

## 2017-08-25 MED ORDER — MORPHINE BOLUS VIA INFUSION
5.0000 mg | INTRAVENOUS | Status: DC | PRN
  Filled 2017-08-25: qty 20

## 2017-08-25 MED ORDER — ACETAMINOPHEN 650 MG RE SUPP: 650.0000 mg | RECTAL | 0 refills | Status: AC | PRN

## 2017-08-25 MED ORDER — LIDOCAINE 5 % EX PTCH: 1.0000 | MEDICATED_PATCH | CUTANEOUS | 0 refills | Status: AC

## 2017-08-25 MED ORDER — LORAZEPAM BOLUS VIA INFUSION: 2.0000 mg | INTRAVENOUS | Status: AC | PRN

## 2017-08-25 MED ORDER — SODIUM CHLORIDE 0.9 % IV SOLN: 10.0000 mg/h | INTRAVENOUS | Status: DC

## 2017-08-25 MED ORDER — LORAZEPAM BOLUS VIA INFUSION
2.0000 mg | INTRAVENOUS | Status: DC | PRN
  Filled 2017-08-25: qty 5

## 2017-08-25 MED ORDER — ONDANSETRON HCL 4 MG/2ML IJ SOLN: 4.0000 mg | Freq: Three times a day (TID) | INTRAMUSCULAR | 0 refills | Status: AC | PRN

## 2017-08-25 MED ORDER — SCOPOLAMINE 1 MG/3DAYS TD PT72
1.0000 | MEDICATED_PATCH | TRANSDERMAL | Status: DC
  Administered 2017-08-25: 1.5 mg via TRANSDERMAL
  Filled 2017-08-25: qty 1

## 2017-08-25 MED ORDER — ORAL CARE MOUTH RINSE: 15.0000 mL | Freq: Three times a day (TID) | OROMUCOSAL | 0 refills | Status: AC

## 2017-08-25 MED ORDER — ONDANSETRON HCL 4 MG/2ML IJ SOLN: 4.0000 mg | Freq: Three times a day (TID) | INTRAMUSCULAR | Status: DC | PRN

## 2017-08-25 MED ORDER — CHLORHEXIDINE GLUCONATE CLOTH 2 % EX PADS: 6.0000 | MEDICATED_PAD | Freq: Every day | CUTANEOUS | Status: AC

## 2017-08-25 NOTE — Discharge Summary (Signed)
Stroke Discharge Summary  Patient ID: [name]   MRN: [id number]      DOB: 02/26/1934  Date of Admission: 08/16/2017 Date of Discharge: 08/25/2017  Attending Physician:  Rosalin Hawking, MD, Stroke MD Consultant(s):   Family Medicine Dr Myna Hidalgo ; Palliative Care Dr Domingo Cocking ; Critical Care Dr Carson Myrtle Patient's PCP:  [facility] [place] DIAGNOSIS: Large Right temporal intraparenchymal hemorrhage and tiny parafalcine subdural hematoma. Patient made DO NOT RESUSCITATE and comfort care by family Principal Problem:   Hemorrhage of right temporal lobe ([place]) Active Problems:   Hydrocephalus   Intraventricular hemorrhage (HCC)   Subdural hematoma (HCC)   Dysarthria due to acute stroke ([place])   Bacteriuria with pyuria   Advance care planning   Goals of care, counseling/discussion   Palliative care by specialist   Sepsis due to pneumonia ([place])   Intracranial hemorrhage ([place])   Atrial fibrillation ([place])   Hypernatremia   Encounter for feeding tube placement   Fever   Leukocytosis   SOB (shortness of breath)   Past Medical History:  Diagnosis Date  . A-fib ([place])   . GERD (gastroesophageal reflux disease)   . Hypercholesteremia   . Stroke [facility])    Past Surgical History:  Procedure Laterality Date  . TOTAL HIP ARTHROPLASTY Left     Allergies as of 08/25/2017   No Known Allergies     Medication List    STOP taking these medications   apixaban 5 MG Tabs tablet Commonly known as:  ELIQUIS   aspirin EC 81 MG tablet   atorvastatin 40 MG tablet Commonly known as:  LIPITOR   atorvastatin 80 MG tablet Commonly known as:  LIPITOR   cyclobenzaprine 5 MG tablet Commonly known as:  FLEXERIL   docusate sodium 100 MG capsule Commonly known as:  COLACE   ferrous sulfate 325 (65 FE) MG tablet   finasteride 5 MG tablet Commonly known as:  PROSCAR   omeprazole 20 MG capsule Commonly known as:  PRILOSEC   sertraline 25 MG tablet Commonly known as:   ZOLOFT   tamsulosin 0.4 MG Caps capsule Commonly known as:  FLOMAX     TAKE these medications   acetaminophen 160 MG/5ML solution Commonly known as:  TYLENOL Place 20.3 mLs (650 mg total) into feeding tube every 4 (four) hours as needed for mild pain (or temp > 37.5 C (99.5 F)).   acetaminophen 650 MG suppository Commonly known as:  TYLENOL Place 1 suppository (650 mg total) rectally every 4 (four) hours as needed for mild pain (or temp > 37.5 C (99.5 F)).   Chlorhexidine Gluconate Cloth 2 % Pads Apply 6 each topically daily at 6 (six) AM.   lidocaine 5 % Commonly known as:  LIDODERM Place 1 patch onto the skin daily. Remove & Discard patch within 12 hours or as directed by MD Start taking on:  08/26/2017   LORazepam 1 mg/mL Soln Commonly known as:  ATIVAN Inject 2-5 mg into the vein every 15 (fifteen) minutes as needed (signs of discomfort).   morphine 5 mg/mL Soln Inject 5-20 mg into the vein every 15 (fifteen) minutes as needed (signs of discomfort).   mouth rinse Liqd solution 15 mLs by Mouth Rinse route every 8 (eight) hours.   ondansetron 4 MG/2ML Soln injection Commonly known as:  ZOFRAN Inject 2 mLs (4 mg total) into the vein every 8 (eight) hours as needed for nausea or vomiting.   scopolamine 1 MG/3DAYS Commonly  known as:  TRANSDERM-SCOP Place 1 patch (1.5 mg total) onto the skin every 3 (three) days. Start taking on:  08/28/2017   sodium chloride flush 0.9 % Soln Commonly known as:  NS 10-40 mLs by Intracatheter route every 12 (twelve) hours.   sodium chloride flush 0.9 % Soln Commonly known as:  NS 10-40 mLs by Intracatheter route as needed (flush).   sodium chloride flush 0.9 % Soln Commonly known as:  NS 10-40 mLs by Intracatheter route as needed (flush).       LABORATORY STUDIES CBC    Component Value Date/Time   WBC 11.5 (H) 08/25/2017 0335   RBC 4.38 08/25/2017 0335   HGB 13.7 08/25/2017 0335   HGB 14.6 06/27/2013 0234   HCT 42.4  08/25/2017 0335   HCT 39.2 08/18/2017 2137   PLT 208 08/25/2017 0335   PLT 275 06/27/2013 0234   MCV 96.8 08/25/2017 0335   MCV 91 06/27/2013 0234   MCH 31.3 08/25/2017 0335   MCHC 32.3 08/25/2017 0335   RDW 13.9 08/25/2017 0335   RDW 14.0 06/27/2013 0234   LYMPHSABS 1.9 08/23/2017 0253   LYMPHSABS 1.4 06/27/2013 0234   MONOABS 1.5 (H) 08/23/2017 0253   MONOABS 0.7 06/27/2013 0234   EOSABS 0.0 08/23/2017 0253   EOSABS 0.1 06/27/2013 0234   BASOSABS 0.1 08/23/2017 0253   BASOSABS 0.0 06/27/2013 0234   CMP    Component Value Date/Time   NA 150 (H) 08/25/2017 0335   NA 140 06/27/2013 0234   K 3.4 (L) 08/25/2017 0335   K 3.9 06/27/2013 0234   CL 114 (H) 08/25/2017 0335   CL 108 (H) 06/27/2013 0234   CO2 23 08/25/2017 0335   CO2 26 06/27/2013 0234   GLUCOSE 128 (H) 08/25/2017 0335   GLUCOSE 130 (H) 06/27/2013 0234   BUN 34 (H) 08/25/2017 0335   BUN 29 (H) 06/27/2013 0234   CREATININE 1.12 08/25/2017 0335   CREATININE 1.35 (H) 06/27/2013 0234   CALCIUM 8.5 (L) 08/25/2017 0335   CALCIUM 9.0 06/27/2013 0234   PROT 6.7 08/23/2017 0253   PROT 7.4 06/27/2013 0234   ALBUMIN 3.0 (L) 08/23/2017 0253   ALBUMIN 3.9 06/27/2013 0234   AST 72 (H) 08/23/2017 0253   AST 29 06/27/2013 0234   ALT 81 (H) 08/23/2017 0253   ALT 31 06/27/2013 0234   ALKPHOS 87 08/23/2017 0253   ALKPHOS 90 06/27/2013 0234   BILITOT 1.2 08/23/2017 0253   BILITOT 0.5 06/27/2013 0234   GFRNONAA 60 (L) 08/25/2017 0335   GFRNONAA 51 (L) 06/27/2013 0234   GFRAA >60 08/25/2017 0335   GFRAA 59 (L) 06/27/2013 0234   COAGS Lab Results  Component Value Date   INR 1.16 08/23/2017   INR 1.04 08/16/2017   INR 1.04 02/13/2017   Lipid Panel    Component Value Date/Time   CHOL 197 08/18/2017 2137   TRIG 163 (H) 08/20/2017 0517   HDL 61 08/18/2017 2137   CHOLHDL 3.2 08/18/2017 2137   VLDL 26 08/18/2017 2137   LDLCALC 110 (H) 08/18/2017 2137   HgbA1C  Lab Results  Component Value Date   HGBA1C 5.8 (H)  08/18/2017   Urinalysis    Component Value Date/Time   COLORURINE YELLOW 08/23/2017 [place] 08/23/2017 0511   APPEARANCEUR Clear 05/16/2012 1535   LABSPEC 1.025 08/23/2017 0511   LABSPEC 1.005 05/16/2012 1535   PHURINE 5.0 08/23/2017 [place] 08/23/2017 0511   GLUCOSEU Negative 05/16/2012 1535  HGBUR MODERATE (A) 08/23/2017 0511   BILIRUBINUR NEGATIVE 08/23/2017 0511   BILIRUBINUR Negative 05/16/2012 1535   KETONESUR NEGATIVE 08/23/2017 0511   PROTEINUR 30 (A) 08/23/2017 0511   NITRITE NEGATIVE 08/23/2017 0511   LEUKOCYTESUR NEGATIVE 08/23/2017 0511   LEUKOCYTESUR Negative 05/16/2012 1535   Urine Drug Screen     Component Value Date/Time   LABOPIA NONE DETECTED 02/13/2017 0908   COCAINSCRNUR NONE DETECTED 02/13/2017 0908   LABBENZ NONE DETECTED 02/13/2017 0908   AMPHETMU NONE DETECTED 02/13/2017 0908   THCU NONE DETECTED 02/13/2017 0908   LABBARB NONE DETECTED 02/13/2017 0908    Alcohol Level No results found for: [facility]   SIGNIFICANT DIAGNOSTIC STUDIES  Ct Head Wo Contrast Result Date: 08/17/2017 IMPRESSION:  1. Decreased right hydrocephalus and improved right to left midline shift, now 1 mm previously 9 mm. Relatively unchanged large right temporal intraparenchymal hemorrhage and tiny parafalcine subdural hematoma. Small amount of left intraventricular hemorrhage now noted.  2. Atrophy, chronic small-vessel white matter ischemic changes and remote infarcts.    Ct Head Code Stroke Wo Contrast Result Date: 08/16/2017 IMPRESSION:  1. Intraparenchymal hematoma in the anterior right temporal lobe with volume of 11 cubic cm with associated intraventricular extension filling the right lateral ventricle temporal and occipital horns.  2. Leftward midline shift measuring 5-9 mm with early/impending subfalcine herniation of the anterior cingulate gyrus.    Echocardiogram:        02/14/2017                                        Study  Conclusions - Left ventricle: The cavity size was normal. Systolic function was normal. The estimated ejection fraction was in the range of 55% to 60%. - Aortic valve: There was trivial regurgitation. - Mitral valve: Calcified annulus. Mildly thickened leaflets . There was mild regurgitation.   PORTABLE CHEST 1 VIEW IMPRESSION: No active disease. 08/20/2017   PORTABLE CHEST 1 VIEW       08/23/2017   IMPRESSION: New mild hazy left lung base opacity, which could represent atelectasis, aspiration and/or pneumonia. Recommend attention on follow-up chest radiographs.   Ct Head Wo Contrast Result Date: 08/17/2017 IMPRESSION: 1. Evolving RIGHT basal ganglia/temporal lobe hemorrhage with intraventricular extension. Similar 3 mm RIGHT to LEFT midline shift. No hydrocephalus. 2. Acute trace parafalcine subdural hematoma. Evolving acute RIGHT cerebral micro hemorrhages seen with amyloid angiopathy. 3. Multiple old large vascular territory and small vessel infarcts. Electronically Signed   By: Elon Alas M.D.   On: 08/17/2017 22:49    Ct Head Wo Contrast Result Date: 08/17/2017 IMPRESSION: 1. Decreased right hydrocephalus and improved right to left midline shift, now 1 mm previously 9 mm. Relatively unchanged large right temporal intraparenchymal hemorrhage and tiny parafalcine subdural hematoma. Small amount of left intraventricular hemorrhage now noted. 2. Atrophy, chronic small-vessel white matter ischemic changes and remote infarcts. Electronically Signed   By: Margarette Canada M.D.   On: 08/17/2017 11:11    Ct Head Wo Contrast Result Date: 08/19/2017  IMPRESSION: Stable hematoma in right basal ganglia and intraventricular hemorrhage in the right greater than left lateral ventricles. Stable associated edema and mass effect with 3 mm right-to-left midline shift. No new acute intracranial abnormality identified. Electronically Signed   By: Kristine Garbe M.D.   On:  08/19/2017 13:07    Ct Head Wo Contrast Result Date: 08/23/2017 IMPRESSION: 1. Slowly evolving right  temporal lobe parenchymal hemorrhage with stable to minimally decreased parenchymal and intraventricular blood volume. 2. Stable ventricle size. Stable mild intracranial mass effect. 3. No new intracranial abnormality.       HISTORY OF PRESENT ILLNESS [name] is a 84 y.o. male past medical history of old stroke with no residual deficit, atrial fibrillation on Eliquis last dose taken the morning of 08/16/2017, witnessed last known normal day or so ago but drove to his lady friend's house this evening around 7 PM for dinner and was not behaving like his normal self.  Some point during that time he started having left-sided weakness for which EMS was called.  When EMS arrived, his systolic blood pressure was in the 220s-30s.  His fingerstick glucose was within normal range. He was brought in as an acute code stroke to the emergency room at [facility] by [facility].  He had also complained of headache at that time. Upon initial evaluation, he had a fixed rightward gaze, left upper extremity plegia, left lower extremity paresis, dense sensory loss and neglect of the left side. He was taken for noncontrast CT of the head that showed a right temporal ICH with IVH.  LKW: At best 7 PM on 08/16/2017, could have been a day or so before that-history unclear tpa given?: no, ICH Premorbid modified Rankin scale (mRS): 0  ICH score 2-1 for intraventricular, 1 for age over [place] Mr. [name] is a 84 y.o. male with PMH of stroke without residual, A. fib on Eliquis admitted for left-sided weakness headache and right-sided gaze, left-sided neglect.  CT showed right basal ganglia and temporal lobe ICH with IVH, as well as chronic left MCA infarct.  Status post Kcentra reversed.  Repeat CT x 2 showed stable hemorrhage and midline shift as well as IVH.   MRI not able to be done due to agitation.  EF 55-60%.  LDL 110 and A1c 5.8.  Large Right temporal intraparenchymal hemorrhage and tiny parafalcine subdural hematoma. Suspected Etiology: Likely HTN hemorrhage  setting of underlying coagulopathy by Eliquis. Resultant Symptoms: fixed rightward gaze, left upper extremity plegia, left lower extremity paresis, dense sensory loss and neglect of the left side. Stroke Risk Factors: atrial fibrillation, hyperlipidemia and hypertension Other Stroke Risk Factors: Advanced age, Hx stroke   08/21/16: He a little more awake alert but still had intermittent agitation, on restraints.  Na 150, off 3% saline and salt tablet.  cortrak clogged on IVF. K low with supplement via PICC line.   Leukocytosis stable today. CXR and UA unremarkable. Continue Rocephin. Palliative care on board. Need cortrak revision tomorrow. I had long discussion with daughters at bedside, updated pt current condition, treatment plan and potential poor prognosis. They are very reasonable and agree with comfort care if he declines.   08/22/2017: Neuro exam stable with left sided weakness. Medicated multiple times overnight with Haldol. Remains confused. Family at bedside. Pt with history of OSA with no CPAP since hospitalization. Multiple episodes of urinary retention reported by nursing. No leukocytosis today and remains afebrile.   08/25/2017 ASSESSMENT:   Neuro exam unchanged. Per nursing patient now afebrile. IV antibiotics in progress. No sedation given overnight except one dose of Toradol. Family supportive at bedside and very hopeful for meaningful recovery. No reports of desats with snoring.  PLAN  08/25/2017: Frequent neuro checks Telemetry monitoring PT/OT/SLP Consult PM & Rehab - awaiting decision CIR vs SNF Consult Case Management /MSW Ongoing aggressive stroke  risk factor management Patient's family counseled to be compliant withhisantithrombotic medications Patient's  family counseled on Lifestyle modifications including, Diet, Exercise, and Stress   HX OF STROKES: Remote left frontoparietal, right MCA and left cerebellar infarcts  MEDICAL ISSUES:   Sepsis - Probable Aspiration Pneumonia Afebrile at this time, WBC trending down Treated with Vanc and Zosyn Hospitalists following, Appreciate assistance   DYSPHAGIA: Passed SLP swallow evaluation but SLP would like patient to be more awake prior to feeding Continue Cortrek and resume TF's when possible Aspiration Precautions in progress  CEREBRAL EDEMA: 3% NS Infusion Discontinued Na 155 today  Free water flushes resumed  Last Head CT 1/13 with stable findings Will continue to follow closely  AFIB, CHRONIC: Hx of atrial fibrillation on Eliquis last dose taken the morning of 08/16/2017 reversal of eliquis with K-centra per pharmacy protocol and d/c Eliquis Rate control  Hypertensive Encephalopathy B/P's stable overnight Will continue to monitor closely  AKI- Resolved Free water flushes in progress  Anemia on Admission-Resolved Will continue to monitor  Hypernatremia - Na 155 today IVF's discontinued Resumed Free water flushes Will continue to monitor closely  Hypokalemia - Resolved Replaced Repeat labs in AM  Hypo Calcium Replaced, remains stable, will continue to monitor  OSA w/CPAP RT consulted Continuous O2 sat in progress No need for Oxygen supplementation thus far  Likely Baseline Dementia with sundowning Discontinued Seroquel due to decreased mentation Family to be at bedside tonight Ativan, Haldol and Toradol discontinued Will continue to monitor closely Encourage family to stay with patient at all times  BPH with Urinary Retention Home meds restarted Discontinue foley when possible Foley reinserted 1/16 due to urinary retention Extra dose of Flomax given 1/16  UTI Urine culture showed Klebsiella, sensitive to rocephin.  Will continue  Rocephin for total 7 days (day 5 today). Repeat urinalysis 1/16 negative  HYPERTENSION: Stable. Episode of elevated B/P this AM due to no B/P meds due to no Cortrek PRN Labatelol IV given with good effects BP goal:140SBPrange Norvasc and Cozaar started Long term BP goal normotensive.   HYPERLIPIDEMIA: Home Meds:  NONE LDL 110 goal < 70 Started on Lipitor 20 mg daily   R/O DIABETES: HgbA1c 5.8 goal < 7.0  Other Active Problems: Principal Problem:   Hemorrhage of right temporal lobe (HCC) Active Problems:   Hydrocephalus   Intraventricular hemorrhage (HCC)   Subdural hematoma (HCC)   Dysarthria due to acute stroke (HCC)   Bacteriuria with pyuria   Advance care planning   Goals of care, counseling/discussion   Palliative care by specialist   Sepsis due to pneumonia [facility])   Intracranial hemorrhage ([place])   Atrial fibrillation ([place])   Hypernatremia   Encounter for feeding tube placement   Fever   Leukocytosis   SOB (shortness of breath)   A palliative care consult was obtained on 08/21/2017. Palliative care met with the family and continued to follow the patient. On 08/25/2017 it was noted that the patient's clinical condition was declining. He had developed a fever and had an episode of nonsustained ventricular tachycardia. Palliative care met with the patient's daughter and her husband and a decision was made to proceed with full comfort care measures with transfer to an independent residential hospice facility. Arrangements were made for discharge to that facility on 08/25/2017.  DISCHARGE EXAM Blood pressure (!) 138/55, pulse 92, temperature 98.6 F (37 C), temperature source Axillary, resp. rate 20, height 5' 11"  (1.803 m), weight 226 lb 10.1 oz (102.8 kg), SpO2 99 %.  General: The patient was minimally responsive. HEENT: Normocephalic atraumatic Lungs: Breath sounds were labored Cardiovascular -S1-S2 heard, irregularly irregular Abdomen: Nondistended  nontender Extremities warm well perfused Neurological exam Mental status: Difficult to arouse and unable to follow commands. Cranial nerves: Pupils equal round reactive to light, post right gaze deviation, no blink to threat from the left, intact blink from the right, right lower facial weakness. Motor exam: 0/5 left upper extremity, 2/5 left lower extremity, 5/5 right upper and lower extremities.  Normal tone normal range of motion in all 4 extremities Sensory exam: Dense sensory loss on the left hemibody.. Neglecting the left side.  Unable to recognize his own hand on the left Unable to perform finger to nose testing. Gait exam was deferred    Discharge Diet   Diet NPO time specified liquids   DISCHARGE PLAN  Disposition:  The patient will be discharged to a hospice facility.    40 minutes were spent preparing discharge.  Mikey Bussing PA-C [facility] Pager [phone] 08/25/2017, 3:58 PM I have personally examined this patient, reviewed notes, independently viewed imaging studies, participated in medical decision making and plan of care.ROS completed by me personally and pertinent positives fully documented  I have made any additions or clarifications directly to the above note. Agree with note above.    Antony Contras, MD Medical Director [facility] Pager: [phone] 08/26/2017 11:54 AM

## 2017-08-25 NOTE — Progress Notes (Signed)
[facility]. Pt discharge education and instructions completed. Pt picked up by [facility] to be transported off to disposition. Dionne BucyP. Amo Chelsei Mcchesney RN

## 2017-08-25 NOTE — Progress Notes (Addendum)
Daily Progress Note   Patient Name: [name]       Date: 08/25/2017 DOB: 03/26/1934  Age: 84 y.o. MRN#: [id number] Attending Physician: Marvel Plan, MD Primary Care Physician: [facility] Date: 08/16/2017  Reason for Consultation/Follow-up: Establishing goals of care, Hospice Evaluation, Inpatient hospice referral, Non pain symptom management, Pain control and Psychosocial/spiritual support  Subjective: Patient seen, chart reviewed.  Clinical condition declining.  He had a fever last night of 101.6, as well as brief run of V. tach.  Patient utilized 1 as needed dose of morphine 2 mg in the past 24 hours.  His respirations are even and mild work of breathing noted at rest.  Patient's daughter Harriett Sine and her husband, have arrived from out of town.  They are agreeable with comfort care and transfer to residential hospice.  We are waiting for other sisters and brother to come to the unit to verify full comfort care and transfer to residential hospice.  I did reach out to social work to see if there was a bed availability at [facility] which is family's choice in the interim  Length of Stay: 9  Current Medications: Scheduled Meds:  .  stroke: mapping our early stages of recovery book   Does not apply Once  . amLODipine  10 mg Oral Daily  . atorvastatin  20 mg Oral QPM  . Chlorhexidine Gluconate Cloth  6 each Topical Q0600  . feeding supplement (PRO-STAT SUGAR FREE 64)  30 mL Per Tube Daily  . finasteride  5 mg Oral Daily  . free water  300 mL Per Tube Q4H  . ipratropium-albuterol  3 mL Nebulization BID  . lidocaine  1 patch Transdermal Q24H  . losartan  100 mg Oral Daily  . mouth rinse  15 mL Mouth Rinse Q8H  . pantoprazole  40 mg Oral Daily  .  senna-docusate  2 tablet Oral BID  . sertraline  25 mg Oral Daily  . sodium chloride flush  10-40 mL Intracatheter Q12H  . tamsulosin  0.4 mg Oral QHS    Continuous Infusions: . piperacillin-tazobactam (ZOSYN)  IV Stopped (08/25/17 1019)    PRN Meds: acetaminophen **OR** acetaminophen (TYLENOL) oral liquid 160 mg/5 mL **OR** acetaminophen, labetalol, morphine injection, sodium chloride flush, sodium chloride flush  Physical Exam  Constitutional:  Acutely ill-appearing elderly man; minimally responsive  HENT:  Head: Normocephalic and atraumatic.  Cardiovascular:  Irregular; run of V. tach  Pulmonary/Chest:  Mild increased work of breathing at rest  Neurological:  Minimally responsive  Skin: Skin is warm and dry. There is pallor.  Psychiatric:  Unable to test  Nursing note and vitals reviewed.           Vital Signs: BP (!) 133/49 (BP Location: Left Arm)   Pulse 91   Temp 98.6 F (37 C) (Axillary)   Resp 20   Ht 5\' 11"  (1.803 m)   Wt 102.8 kg (226 lb 10.1 oz)   SpO2 98%   BMI 31.61 kg/m  SpO2: SpO2: 98 % O2 Device: O2 Device: Nasal Cannula O2 Flow Rate: O2 Flow Rate (L/min): 3 L/min  Intake/output summary:   Intake/Output Summary (Last 24 hours) at 08/25/2017 1135 Last data filed at 08/25/2017 1610 Gross per 24 hour  Intake 50 ml  Output 1550 ml  Net -1500 ml   LBM: Last BM Date: 08/20/17 Baseline Weight: Weight: 89 kg (196 lb 3.4 oz) Most recent weight: Weight: 102.8 kg (226 lb 10.1 oz)       Palliative Assessment/Data:    Flowsheet Rows     Most Recent Value  Intake Tab  Referral Department  Hospitalist  Unit at Time of Referral  Med/Surg Unit  Palliative Care Primary Diagnosis  Neurology  Date Notified  08/20/17  Palliative Care Type  New Palliative care  Reason for referral  Clarify Goals of Care  Date of Admission  08/16/17  Date first seen by Palliative Care  08/21/17  # of days Palliative referral response time  1 Day(s)  # of days IP prior  to Palliative referral  4  Clinical Assessment  Psychosocial & Spiritual Assessment  Palliative Care Outcomes      Patient Active Problem List   Diagnosis Date Noted  . Sepsis due to pneumonia (HCC)   . Intracranial hemorrhage (HCC)   . Atrial fibrillation (HCC)   . Hypernatremia   . Encounter for feeding tube placement   . Fever   . Leukocytosis   . SOB (shortness of breath)   . Advance care planning   . Goals of care, counseling/discussion   . Palliative care by specialist   . Hemorrhage of right temporal lobe (HCC) 08/18/2017  . Subdural hematoma (HCC) 08/17/2017  . Dysarthria due to acute stroke (HCC) 08/17/2017  . Bacteriuria with pyuria 08/17/2017  . Intraventricular hemorrhage (HCC) 08/16/2017  . Left shoulder pain 02/17/2017  . 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 Hydrocephalus 02/13/2017  . BPH (benign prostatic hyperplasia) 05/01/2016  . Paroxysmal atrial fibrillation (HCC) 05/01/2016  . Hyperlipidemia, unspecified 05/01/2016    Palliative Care Assessment & Plan   Patient Profile: 84 y.o.malewith past medical history of a fib on Eliquis, CVA 02/2017,admitted on 1/10/2019with L facial droop, L sided weakness, found to have large R temporal hematoma with intraventricular hemorrhage.Most recent CT on 1/13 showed 3mm R to left midline shift. Has had some changing breathing patterns that have been concerning, periods of apnea- chest xray is clear. Has NG tube in place- currently clogged- plan for revision tomorrow. Has not completed SLP eval. Showing slight improvements daily in mental status, but overall prognosis currently unknown, could be poor.   Palliative medicine consulted for GOC.  Despite medical interventions, treating the treatable, patient has continued to decline.  Per family, patient's goals would not be  for aggressive care nor would he ever want to go to a nursing home and they are leaning towards residential  hospice for end-of-life care   Recommendations/Plan: DNR/DNI Continue with morphine 2 mg as needed for pain or shortness of breath.  Monitor for need for scheduled dosing Family has arrived from out of town and leaning towards residential hospice for end-of-life care  Have notified social work of this potential transfer and she [place] [facility] to see if [place] a [place] availability   Code Status:    Code Status Orders  (From admission, onward)        Start     Ordered   08/16/17 2328  Do not attempt resuscitation (DNR)  Continuous    Question Answer Comment  In the event of cardiac or respiratory ARREST Do not call a "code blue"   In the event of cardiac or respiratory ARREST Do not perform Intubation, CPR, defibrillation or ACLS   In the event of cardiac or respiratory ARREST Use medication by any route, position, wound care, and other measures to relive pain and suffering. May use oxygen, suction and manual treatment of airway obstruction as needed for comfort.      08/16/17 2327    Code Status History    Date Active Date Inactive Code Status Order ID Comments User Context   08/16/2017 23:03 08/16/2017 23:27 Full Code [id number]  Milon DikesArora, Ashish, MD ED   02/15/2017 17:23 03/02/2017 17:58 DNR [id number]  Charlton AmorAngiulli, Daniel J, PA-C Inpatient   02/15/2017 17:23 02/15/2017 17:23 Full Code [id number]  Charlton Amorngiulli, Daniel J, PA-C Inpatient   02/14/2017 13:22 02/15/2017 17:15 DNR [id number]  Alford HighlandWieting, Richard, MD Inpatient   02/13/2017 16:11 02/14/2017 13:22 Full Code [id number]  Enid BaasKalisetti, Radhika, MD Inpatient   04/20/2016 19:48 04/21/2016 07:57 Full Code [id number]  Auburn BilberryPatel, Shreyang, MD ED    Advance Directive Documentation     Most Recent Value  Type of Advance Directive  Living will, Healthcare Power of Attorney, Out of facility DNR (pink MOST or yellow form)  Pre-existing out of facility DNR order (yellow form or pink MOST form)  No data  "MOST" Form in Place?  No data         Prognosis:   < 2 weeks setting of a large right temporal hematoma, with in ventricular hemorrhage; patient now minimally responsive, unable to take anything by mouth, high risk for acute aspiration  Discharge Planning: Lik[place]  Addendum 1400: Family all present. Pt now full comfort care and desires [facility]. Have called SW to confirm  Care plan was discussed with Marisue IvanLiz, SW, and Dr Pearlean BrownieSethi  Thank you for allowing the Palliative Medicine Team to assist in the care of this patient.   Time In: 1100 Time Out: 1135 Total Time 35 min Prolonged Time Billed  no       Greater than 50%  of this time was spent counseling and coordinating care related to the above assessment and plan.  Irean HongSarah Grace Jaanvi Fizer, NP  Please contact Palliative Medicine Team phone at [phone] for questions and concerns.

## 2017-08-25 NOTE — Clinical Social Work Note (Signed)
Clinical Social Work Assessment  Patient Details  Name: [name] MRN: [id number] Date of Birth: Dec 04, 1933  Date of referral:  08/25/17               Reason for consult:  Facility Placement, End of Life/Hospice                Permission sought to share information with:  Facility Sport and exercise psychologist, Family Supports Permission granted to share information::  Yes, Verbal Permission Granted  Name::     [name]  Agency::  [facility]  Relationship::  Daughters  Contact Information:     Housing/Transportation Living arrangements for the past 2 months:  Single Family Home Source of Information:  Adult Children Patient Interpreter Needed:  None Criminal Activity/Legal Involvement Pertinent to Current Situation/Hospitalization:  No - Comment as needed Significant Relationships:  Adult Children Lives with:  Self Do you feel safe going back to the place where you live?  Yes Need for family participation in patient care:  Yes (Comment)(patient minimally responsive)  Care giving concerns:  Patient is at end of life, and family has decided to pursue residential hospice placement for the patient.    Social Worker assessment / plan:  CSW met with patient's daughter at bedside to introduce self and confirm referral and preference for [facility]. CSW discussed that the facility has a bed for the patient, and admissions nurse will be calling to discuss admission paperwork and consents. CSW will continue to follow to coordinate discharge.  Employment status:  Retired Forensic scientist:  Medicare PT Recommendations:  [place] / Referral to community resources:     Patient/Family's Response to care:  Patient's family is agreeable to pursue end of life care and appreciative of CSW assistance.  Patient/Family's Understanding of and Emotional Response to Diagnosis, Current Treatment, and Prognosis:   Patient's family are aware that the patient has a poor prognosis, and they just want to keep him comfortable. Patient's family was upset and crying at bedside, but said they knew they were doing what he would want. Patient's family discussed how everyone at the hospital had been wonderful and they were appreciative of the care that the patient had received during admission.  Emotional Assessment Appearance:  Appears stated age Attitude/Demeanor/Rapport:  Unable to Assess Affect (typically observed):  Unable to Assess Orientation:  (not oriented) Alcohol / Substance use:  Not Applicable Psych involvement (Current and /or in the community):  No (Comment)  Discharge Needs  Concerns to be addressed:  Care Coordination Readmission within the last 30 days:  No Current discharge risk:  Lives alone, Physical Impairment, Terminally ill Barriers to Discharge:  No Barriers Identified   Geralynn Ochs, LCSW 08/25/2017, 5:26 PM

## 2017-08-25 NOTE — Progress Notes (Signed)
Pt telemetry discontinued; tele and corktrak in left nare removed per order and family request. Gregory BucyP. Amo Robert Sunga RN

## 2017-08-25 NOTE — Progress Notes (Signed)
NEUROHOSPITALISTS STROKE TEAM - DAILY PROGRESS NOTE   SUBJECTIVE (INTERVAL HISTORY)  His daughter from [place] at bedside. Intermittent high grade fever overnight, no significant change of mentation but more lethargic and less agitation today. Eyes open but not interactive. Still on abx, Na improved on free water. BP stable with BP meds. Palliative care on board, family waiting for  Likely comfort care today     OBJECTIVE Lab Results: CBC:  Recent Labs  Lab 08/23/17 0253 08/24/17 0401 08/25/17 0335  WBC 11.0* 10.6* 11.5*  HGB 13.9 13.3 13.7  HCT 43.3 41.4 42.4  MCV 97.3 97.4 96.8  PLT 242 218 208   BMP: Recent Labs  Lab 08/18/17 2137 08/19/17 0318  08/21/17 0500 08/22/17 0448  08/23/17 0253 08/23/17 1433 08/24/17 0401 08/24/17 1529 08/24/17 2030 08/25/17 0335  NA 147*  146* 148*   < > 150* 158*   < > 155* 155* 152* 152* 150* 150*  K  --   --    < > 2.9* 3.3*  --  3.8  --  3.3*  --   --  3.4*  CL  --   --    < > 114* 127*  --  124*  --  117*  --   --  114*  CO2  --   --    < > 22 21*  --  20*  --  23  --   --  23  GLUCOSE  --   --    < > 310* 106*  --  128*  --  145*  --   --  128*  BUN  --   --    < > 33* 39*  --  37*  --  36*  --   --  34*  CREATININE  --   --    < > 0.72 0.95  --  1.20  --  1.40*  --   --  1.12  CALCIUM  --   --    < > 7.6* 8.5*  --  8.7*  --  8.6*  --   --  8.5*  MG 2.2 2.1  --   --   --   --   --   --  2.7*  --   --   --   PHOS 2.6 2.6  --   --   --   --   --   --  6.4*  --   --   --    < > = values in this interval not displayed.   Liver Function Tests:  Recent Labs  Lab 08/23/17 0253  AST 72*  ALT 81*  ALKPHOS 87  BILITOT 1.2  PROT 6.7  ALBUMIN 3.0*   Recent Labs    08/23/17 0253  APTT 37*  INR 1.16   PHYSICAL EXAM Temp:  [98.1 F (36.7 C)-101.7 F (38.7 C)] 98.6 F (37 C) (01/19 1126) Pulse Rate:  [83-98] 91 (01/19 1126) Resp:  [18-24] 20 (01/19 1126) BP:  (117-149)/(47-67) 133/49 (01/19 1126) SpO2:  [98 %-100 %] 98 % (01/19 1126) Weight:  [226 lb 10.1 oz (102.8 kg)] 226 lb 10.1 oz (102.8 kg) (01/19 0600) General - Well nourished, well developed elderly Caucasian male, not in acute distress, breathing improved from yesterday but still has obstructive sleep apnea breathing pattern HEENT-  Normocephalic,    Cardiovascular - Regular rate and rhythm  Neurological Exam : Mental status: eyes closed, briefly open to voice, orientated to self  and people but not to time or place. Severe dysarthria with intelligible words..  Cranial nerves: Pupils equal round reactive to light but against eye opening with me. No significant facial drop on exam. Motor exam: b/l upper extremity moving equally, against gravity, not following commands, in mittens bilaterally, 3/5 left lower extremity, 4/5 right lower extremities on pain stimulation. DTR 1+ and not cooperative on babinski testing. Sensory and coordination and gait not tested  IMAGING:  I have personally reviewed the radiological images below and agree with the radiology interpretations.  Ct Head Wo Contrast Result Date: 08/17/2017 IMPRESSION:  1. Decreased right hydrocephalus and improved right to left midline shift, now 1 mm previously 9 mm. Relatively unchanged large right temporal intraparenchymal hemorrhage and tiny parafalcine subdural hematoma. Small amount of left intraventricular hemorrhage now noted.  2. Atrophy, chronic small-vessel white matter ischemic changes and remote infarcts.   Ct Head Code Stroke Wo Contrast Result Date: 08/16/2017 IMPRESSION:  1. Intraparenchymal hematoma in the anterior right temporal lobe with volume of 11 cubic cm with associated intraventricular extension filling the right lateral ventricle temporal and occipital horns.  2. Leftward midline shift measuring 5-9 mm with early/impending subfalcine herniation of the anterior cingulate gyrus.   Echocardiogram:          02/14/2017                                        Study Conclusions - Left ventricle: The cavity size was normal. Systolic function was   normal. The estimated ejection fraction was in the range of 55%   to 60%. - Aortic valve: There was trivial regurgitation. - Mitral valve: Calcified annulus. Mildly thickened leaflets .   There was mild regurgitation.  PORTABLE CHEST 1 VIEW IMPRESSION: No active disease. 08/20/2017   PORTABLE CHEST 1 VIEW       08/23/2017   IMPRESSION: New mild hazy left lung base opacity, which could represent atelectasis, aspiration and/or pneumonia. Recommend attention on follow-up chest radiographs.  Ct Head Wo Contrast Result Date: 08/17/2017 IMPRESSION: 1. Evolving RIGHT basal ganglia/temporal lobe hemorrhage with intraventricular extension. Similar 3 mm RIGHT to LEFT midline shift. No hydrocephalus. 2. Acute trace parafalcine subdural hematoma. Evolving acute RIGHT cerebral micro hemorrhages seen with amyloid angiopathy. 3. Multiple old large vascular territory and small vessel infarcts. Electronically Signed   By: Elon Alas M.D.   On: 08/17/2017 22:49   Ct Head Wo Contrast Result Date: 08/17/2017 IMPRESSION: 1. Decreased right hydrocephalus and improved right to left midline shift, now 1 mm previously 9 mm. Relatively unchanged large right temporal intraparenchymal hemorrhage and tiny parafalcine subdural hematoma. Small amount of left intraventricular hemorrhage now noted. 2. Atrophy, chronic small-vessel white matter ischemic changes and remote infarcts. Electronically Signed   By: Margarette Canada M.D.   On: 08/17/2017 11:11   Ct Head Wo Contrast Result Date: 08/19/2017  IMPRESSION: Stable hematoma in right basal ganglia and intraventricular hemorrhage in the right greater than left lateral ventricles. Stable associated edema and mass effect with 3 mm right-to-left midline shift. No new acute intracranial abnormality identified. Electronically Signed   By:  Kristine Garbe M.D.   On: 08/19/2017 13:07    Ct Head Wo Contrast Result Date: 08/23/2017 IMPRESSION: 1. Slowly evolving right temporal lobe parenchymal hemorrhage with stable to minimally decreased parenchymal and intraventricular blood volume. 2. Stable ventricle size.  Stable mild intracranial mass effect.  3. No new intracranial abnormality.     IMPRESSION: [name] is a 84 y.o. male with PMH of stroke without residual, A. fib on Eliquis admitted for left-sided weakness headache and right-sided gaze, left-sided neglect.  CT showed right basal ganglia and temporal lobe ICH with IVH, as well as chronic left MCA infarct.  Status post Kcentra reversed.  Repeat CT x 2 showed stable hemorrhage and midline shift as well as IVH.  MRI not able to be done due to agitation.  EF 55-60%.  LDL 110 and A1c 5.8.  Large Right temporal intraparenchymal hemorrhage and tiny parafalcine subdural hematoma.  Suspected Etiology: Likely HTN hemorrhage  setting of underlying coagulopathy by Eliquis. Resultant Symptoms: fixed rightward gaze, left upper extremity plegia, left lower extremity paresis, dense sensory loss and neglect of the left side. Stroke Risk Factors: atrial fibrillation, hyperlipidemia and hypertension Other Stroke Risk Factors: Advanced age, Hx stroke  Outstanding Stroke Work-up Studies:    Work up completed at this time  08/21/16: He a little more awake alert but still had intermittent agitation, on restraints.  Na 150, off 3% saline and salt tablet.  cortrak clogged on IVF. K low with supplement via PICC line.   Leukocytosis stable today. CXR and UA unremarkable. Continue Rocephin. Palliative care on board. Need cortrak revision tomorrow. I had long discussion with daughters at bedside, updated pt current condition, treatment plan and potential poor prognosis. They are very reasonable and agree with comfort care if declines.   08/22/2017: Neuro exam stable with left  sided weakness. Medicated multiple times overnight with Haldol. Remains confused. Family at bedside. Pt with history of OSA with no CPAP since hospitalization. Multiple episodes of Urinary retention reported by nursing. No leukocytosis today and remains afebrile.   08/25/2017 ASSESSMENT:   Neuro exam unchanged. Per nursing patient now afebrile. IV antibiotics in progress. No sedation given overnight except one dose of Toradol. Family supportive at bedside and very hopeful for meaningful recovery. No reports of desats with snoring.  PLAN  08/25/2017: Frequent neuro checks Telemetry monitoring PT/OT/SLP Consult PM & Rehab - awaiting decision CIR vs SNF Consult Case Management /MSW Ongoing aggressive stroke risk factor management Patient's family counseled to be compliant with his antithrombotic medications Patient's family counseled on Lifestyle modifications including, Diet, Exercise, and Stress Follow up with [facility] in 6 weeks  HX OF STROKES: remote left frontoparietal, right MCA and left cerebellar infarcts  MEDICAL ISSUES: Sepsis - Probable Aspiration Pneumonia Afebrile at this time, WBC trending down Vanc and Zosyn in progress Hospitalists following, Appreciate assistance   DYSPHAGIA: Passed SLP swallow evaluation but SLP would like patient to be more awake prior to feeding Continue Cortrek and resume TF's when possible Aspiration Precautions in progress  CEREBRAL EDEMA: 3% NS Infusion Discontinued Na 155 today  Free water flushes resumed  Last Head CT 1/13 with stable findings Will continue to follow closely  AFIB, CHRONIC: Hx of atrial fibrillation on Eliquis last dose taken the morning of 08/16/2017 reversal of eliquis with K-centra per pharmacy protocol and d/c Eliquis Rate control  Hypertensive Encephalopathy B/P's stable overnight Will continue to monitor closely  AKI- Resolved Free water flushes in progress  Anemia on  Admission-Resolved Will continue to monitor  Hypernatremia - Na 155 today IVF's discontinued Resumed Free water flushes Will continue to monitor closely Repeat labs in AM  Hypokalemia - Resolved Replaced Repeat labs in AM  Hypo Calcium Replaced, remains stable, will continue to monitor  OSA w/CPAP RT consulted Continuous O2 sat in progress No need for Oxygen supplementation thus far  Likely Baseline Dementia with sundowning Discontinued Seroquel due to decreased mentation Family to be at bedside tonight Ativan, Haldol and Toradol discontinued Will continue to monitor closely Encourage family to stay with patient at all times  BPH with Urinary Retention Home meds restarted Discontinue foley when possible Foley reinserted 1/16 due to urinary retention Extra dose of Flomax given 1/16  UTI Urine culture showed Klebsiella, sensitive to rocephin.  Will continue Rocephin for total 7 days (day 5 today).  Repeat urinalysis 1/16 negative  HYPERTENSION: Stable. Episode of elevated B/P this AM due to no B/P meds due to no Cortrek PRN Labatelol IV given with good effects BP goal: 140 SBP range Norvasc and Cozaar started Long term BP goal normotensive. Home Meds: NONE  HYPERLIPIDEMIA:    Component Value Date/Time   CHOL 197 08/18/2017 2137   TRIG 163 (H) 08/20/2017 0517   HDL 61 08/18/2017 2137   CHOLHDL 3.2 08/18/2017 2137   VLDL 26 08/18/2017 2137   LDLCALC 110 (H) 08/18/2017 2137  Home Meds:  NONE LDL  goal < 70 Started on Lipitor 20 mg daily Continue statin at discharge  R/O DIABETES: Lab Results  Component Value Date   HGBA1C 5.8 (H) 08/18/2017  HgbA1c goal < 7.0  Other Active Problems: Principal Problem:   Hemorrhage of right temporal lobe (HCC) Active Problems:   Hydrocephalus   Intraventricular hemorrhage (HCC)   Subdural hematoma (HCC)   Dysarthria due to acute stroke (HCC)   Bacteriuria with pyuria   Advance care planning   Goals of care,  counseling/discussion   Palliative care by specialist   Sepsis due to pneumonia ([place])   Intracranial hemorrhage ([place])   Atrial fibrillation ([place])   Hypernatremia   Encounter for feeding tube placement   Fever   Leukocytosis   SOB (shortness of breath)    Hospital day # 9 VTE prophylaxis:SCD's  Diet : Diet NPO time specified   FAMILY UPDATES: No family at bedside  TEAM UPDATES: Rosalin Hawking, MD STATUS:  DNR     Prior Home Stroke Medications:  aspirin 81 mg daily and Eliquis (apixaban) daily  Discharge Stroke Meds:  Please discharge patient on TBD   Disposition: 01-Home or Self Care Therapy Recs:               CIR vs SNF Home Equipment:         PENDING Follow Up:  Follow-up Information    Garvin Fila, MD. Schedule an appointment as soon as possible for a visit in 6 week(s).   Specialties:  Neurology, Radiology Contact information: [address] [place] [address] [place], [place] -PCP Follow up in 1-2 weeks         Patient's condition remains poor. Family thinking about comfort care and are leaning towards transfer to [facility] hospitalists when bed available. I met with the patient's daughter who arrived from out of state and answered questions. Appreciate palliative following. Family like to wait another daughter coming from [facility]. Likely comfort care when family is ready. Greater than 50% time during this 25 minute visit was spent on counseling and coordination of care about his intracerebral hemorrhage and discussion about comfort care   Antony Contras, MD Stroke Neurology 08/25/2017 12:43 PM   To contact Stroke Continuity provider, please refer to [email or web address] After hours,  contact General Neurology

## 2017-08-25 NOTE — Progress Notes (Signed)
Discharge to: [facility] Anticipated discharge date: 08/25/17 Family notified: Yes, at bedside Transportation by: [facility]  Report #: [phone]  CSW signing off.  Blenda Nicelylizabeth Demarkus Remmel LCSW [phone]

## 2017-08-27 LAB — GLUCOSE, CAPILLARY: Glucose-Capillary: 144 mg/dL — ABNORMAL HIGH (ref 65–99)

## 2017-08-28 LAB — CULTURE, BLOOD (ROUTINE X 2)
Culture: NO GROWTH
Culture: NO GROWTH
SPECIAL REQUESTS: ADEQUATE

## 2017-08-29 DIAGNOSIS — I639 Cerebral infarction, unspecified: Secondary | ICD-10-CM

## 2017-09-07 DEATH — deceased

## 2019-01-02 IMAGING — CT CT HEAD W/O CM
4 series · 16 of 47 positions shown, 18 images · non-contrast
Comparison: 08/16/2017 CT and prior studies

CLINICAL DATA: 83-year-old male-follow-up intracranial hemorrhage.

EXAM:
CT HEAD WITHOUT CONTRAST
TECHNIQUE: Contiguous axial images were obtained from the base of the skull
through the vertex without intravenous contrast.

[Series 3: head wo · axial · 0.40mm/px · z∈[-82,+38]mm · 7 of 32 slices shown, 9 images]
[im 4/32  brain]
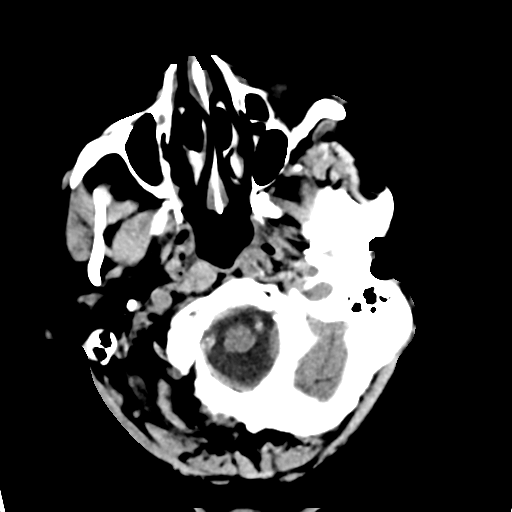
[im 4/32  bone]
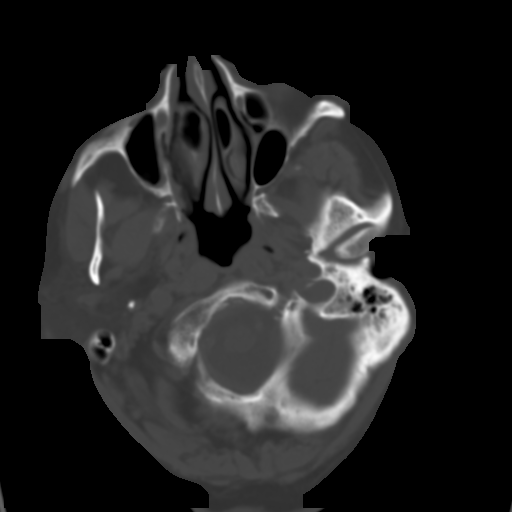
[im 8/32  brain]
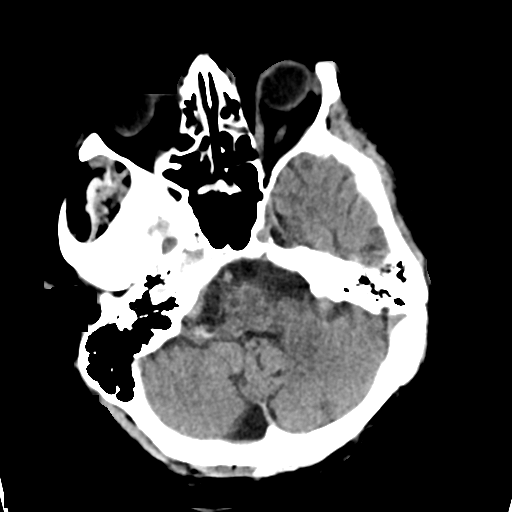
[im 12/32  brain]
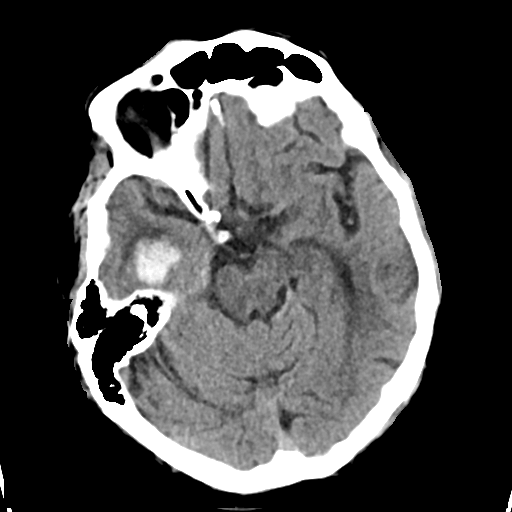
[im 16/32  brain]
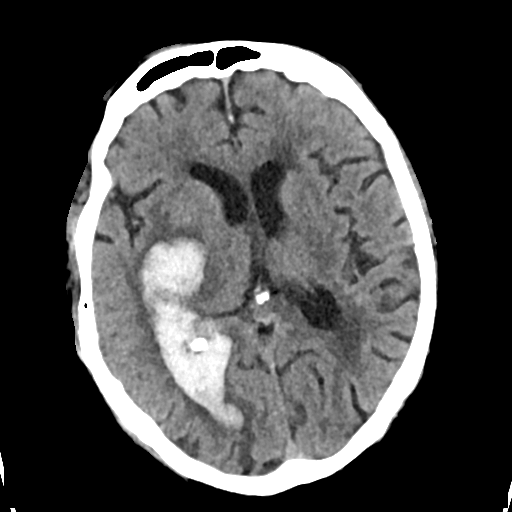
[im 20/32  brain]
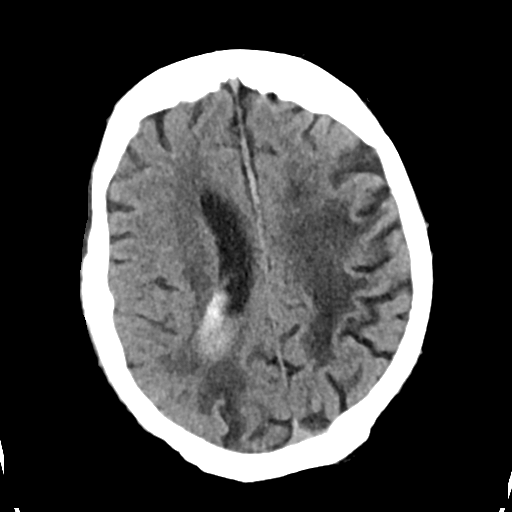
[im 20/32  bone]
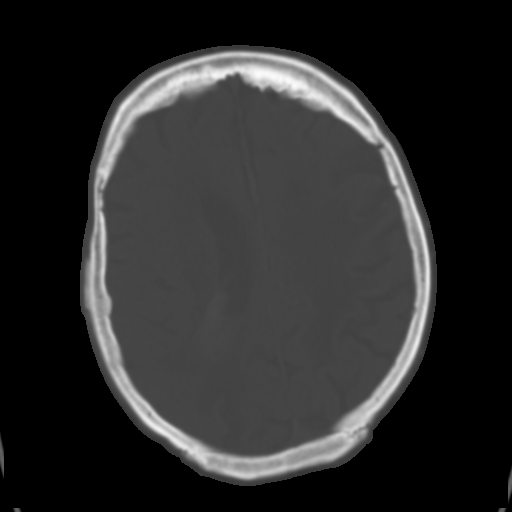
[im 24/32  brain]
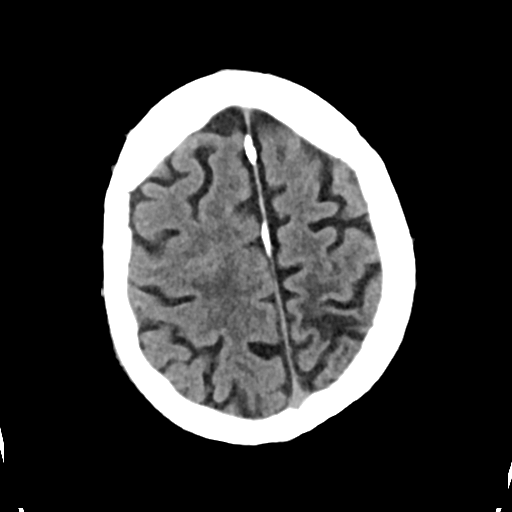
[im 28/32  brain]
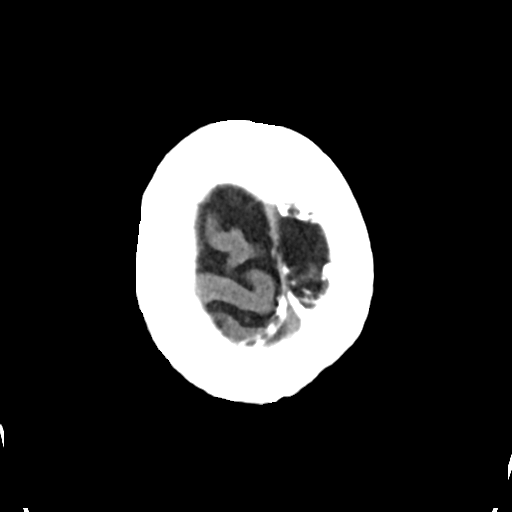

[Series 4: head bone · axial · 0.44mm/px · z∈[-82,-50]mm · 3 of 80 slices shown]
[im 8/80  bone]
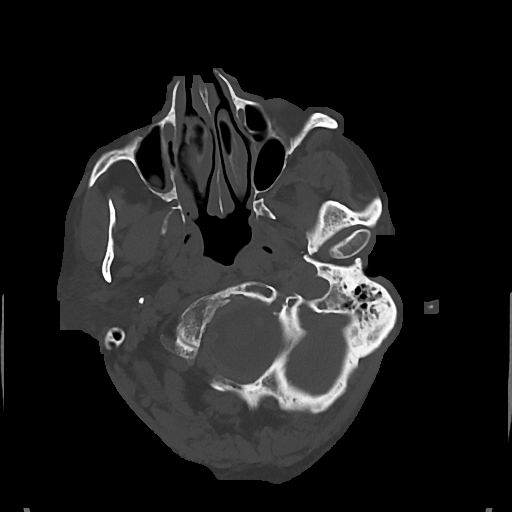
[im 16/80  bone]
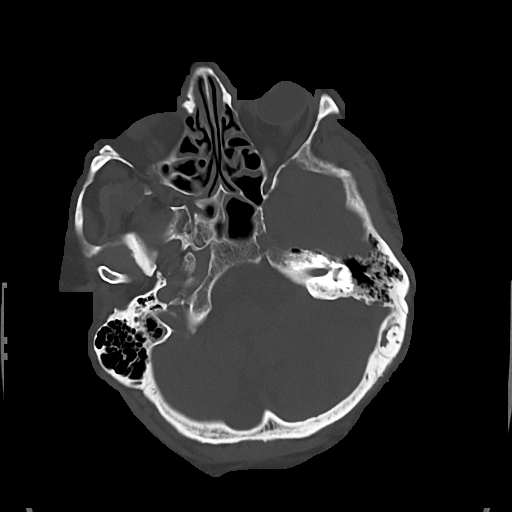
[im 24/80  bone]
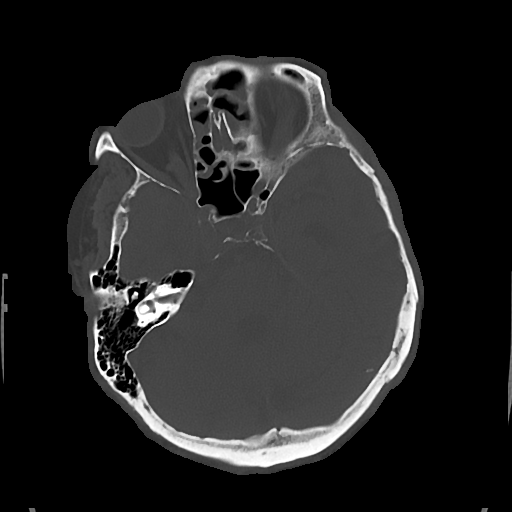

[Series 5: cor soft · coronal · 0.36mm/px · 3 of 70 slices shown]
[im 24/70  brain]
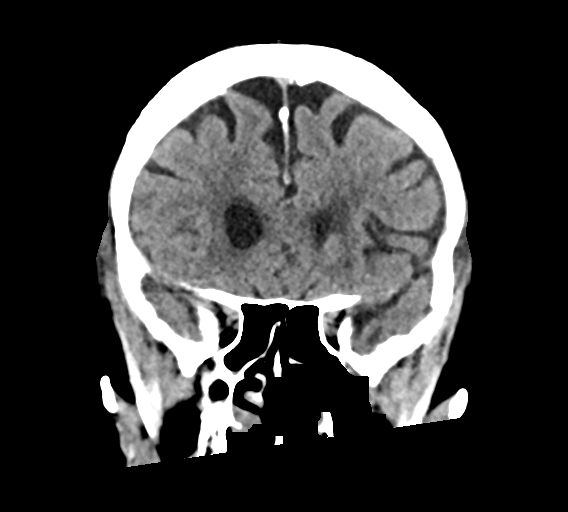
[im 31/70  brain]
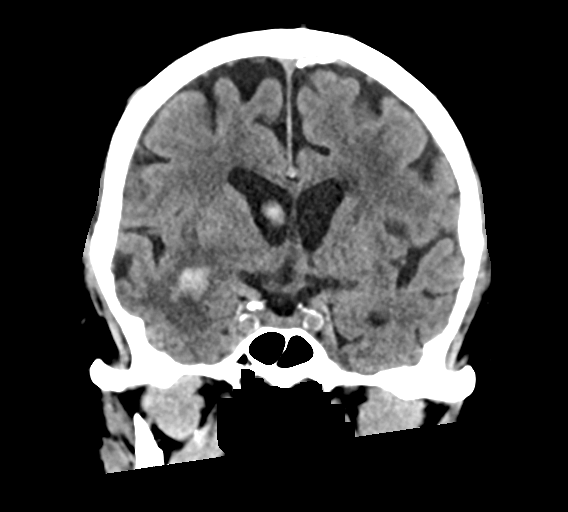
[im 39/70  brain]
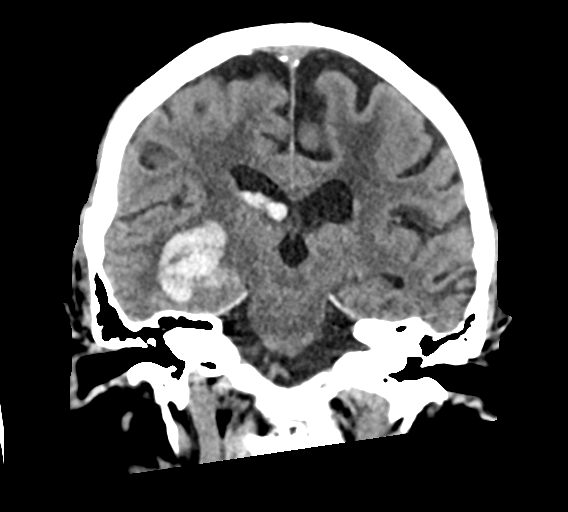

[Series 6: sag soft · sagittal · 0.36mm/px · 3 of 67 slices shown]
[im 26/67  brain]
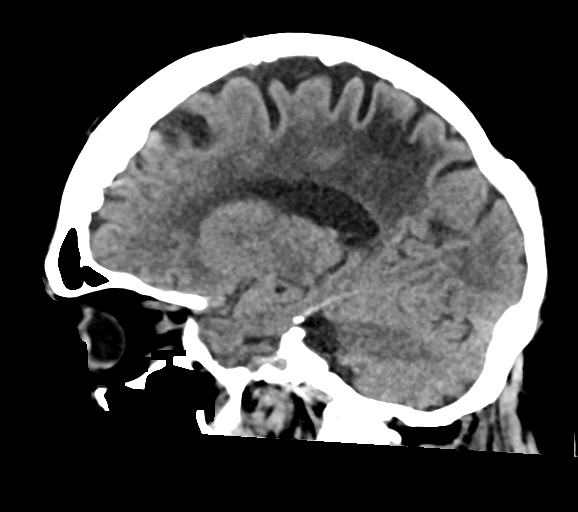
[im 34/67  brain]
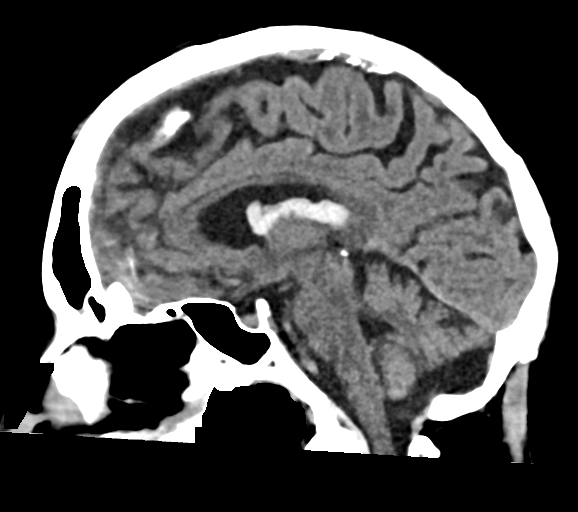
[im 41/67  brain]
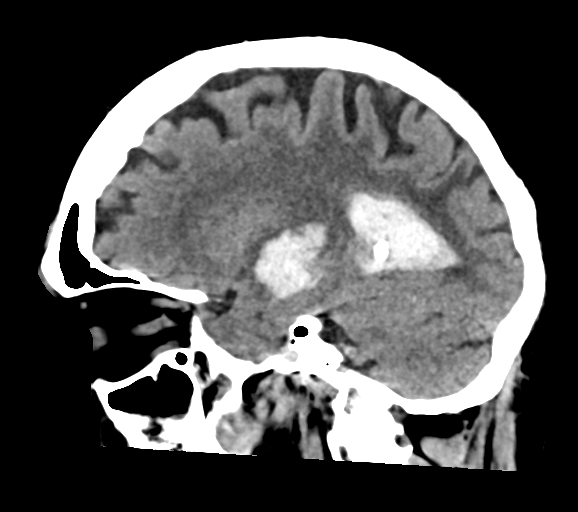

[16 of 47 positions shown; findings below may reference images not displayed]

FINDINGS: Brain: There has been little interval change of right temporal
intraparenchymal hematoma with maximal transverse diameter of 2.8 cm
([DATE]). A large amount of right intraventricular hemorrhage is again
identified. A small amount of hemorrhage within the left occipital
horn is now noted.

A tiny parafalcine subdural hematoma measuring 1 mm is unchanged.

Right lateral ventricle size has decreased. Fullness of the left
lateral ventricle is again noted. Right to left midline shift now
measures 1 mm, previously 9 mm.

Atrophy, chronic small-vessel white matter ischemic changes and
remote left frontoparietal, right MCA and left cerebellar infarcts
again noted.

Vascular: Atherosclerotic calcifications again noted.

Skull: No acute abnormality.

Sinuses/Orbits: No acute abnormality

Other: None
IMPRESSION: 1. Decreased right hydrocephalus and improved right to left midline
shift, now 1 mm previously 9 mm. Relatively unchanged large right
temporal intraparenchymal hemorrhage and tiny parafalcine subdural
hematoma. Small amount of left intraventricular hemorrhage now
noted.
2. Atrophy, chronic small-vessel white matter ischemic changes and
remote infarcts.

## 2019-01-02 IMAGING — CT CT HEAD W/O CM
3 of 4 series · 15 of 47 positions shown, 18 images · non-contrast
Comparison: CT HEAD August 17, 2017 at 9913 hours

CLINICAL DATA: Follow-up hemorrhage. History of amyloid angiopathy.

EXAM:
CT HEAD WITHOUT CONTRAST
TECHNIQUE: Contiguous axial images were obtained from the base of the skull
through the vertex without intravenous contrast.

[Series 4: head 2.0 h70h · axial · 0.44mm/px · z∈[-79,+69]mm · 9 of 92 slices shown, 12 images]
[im 9/92  brain]
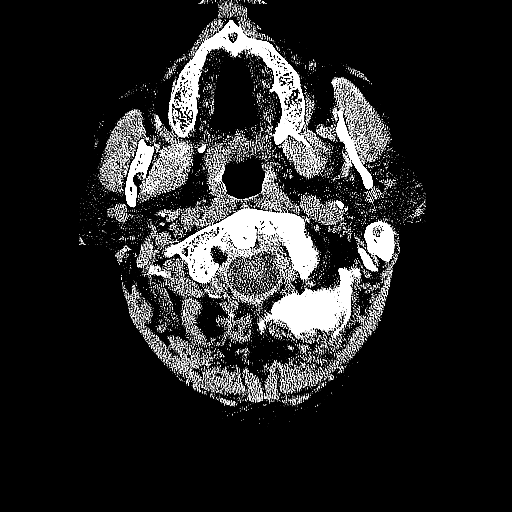
[im 9/92  bone]
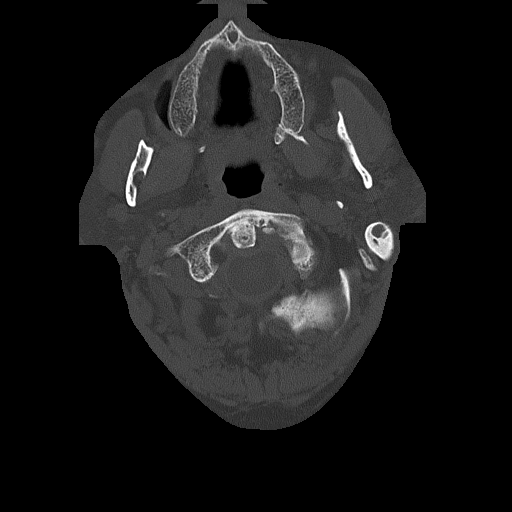
[im 18/92  brain]
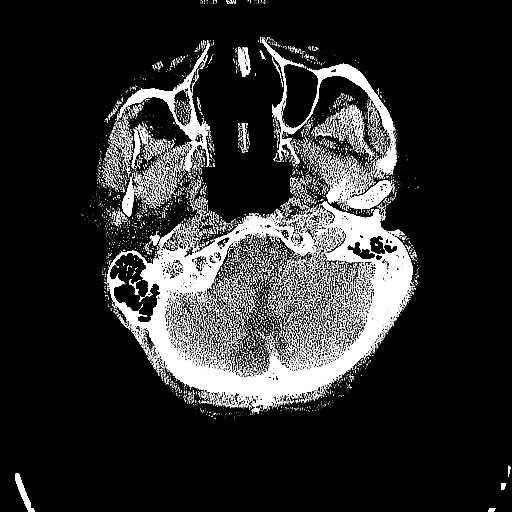
[im 27/92  brain]
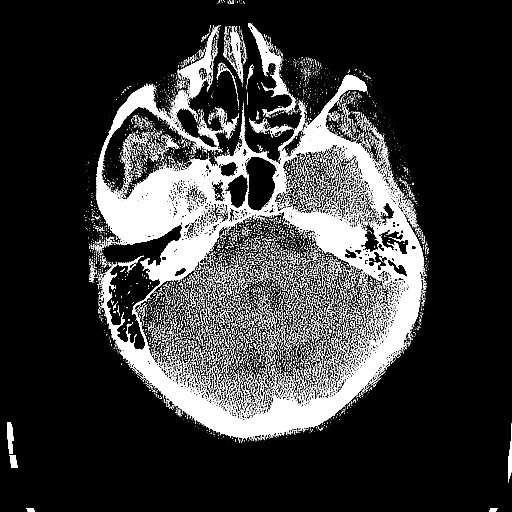
[im 35/92  brain]
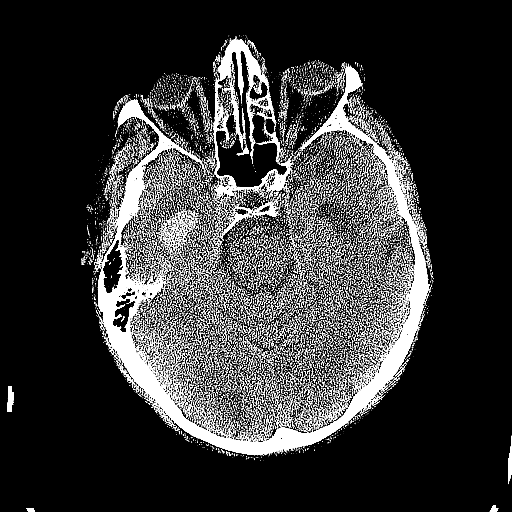
[im 48/92  brain]
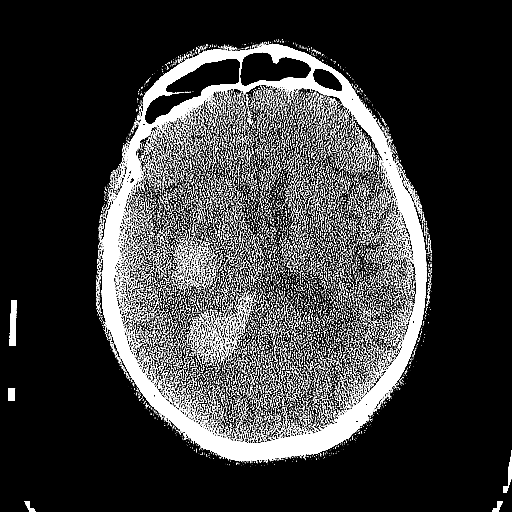
[im 48/92  bone]
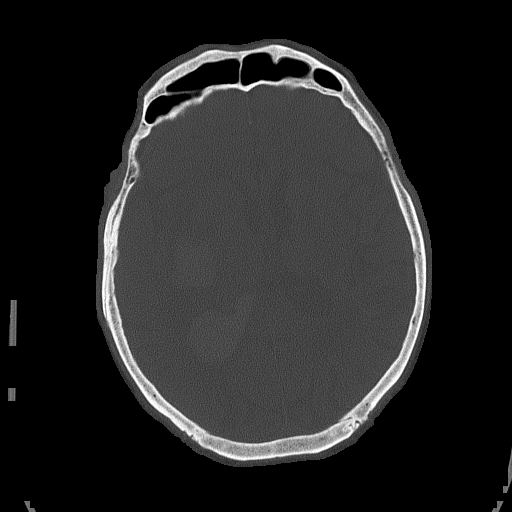
[im 57/92  brain]
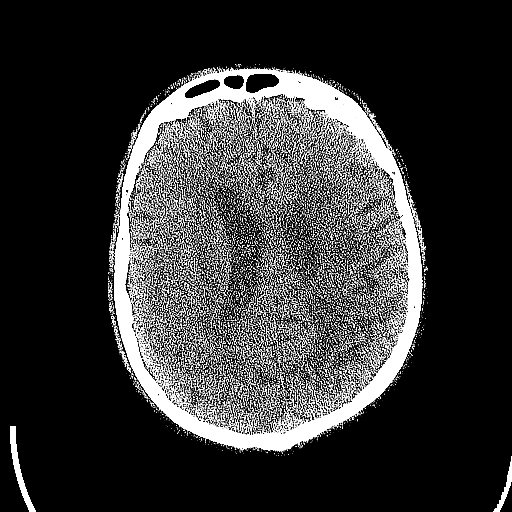
[im 66/92  brain]
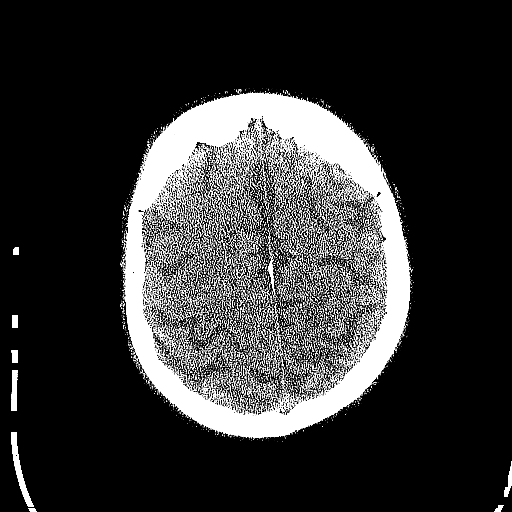
[im 74/92  brain]
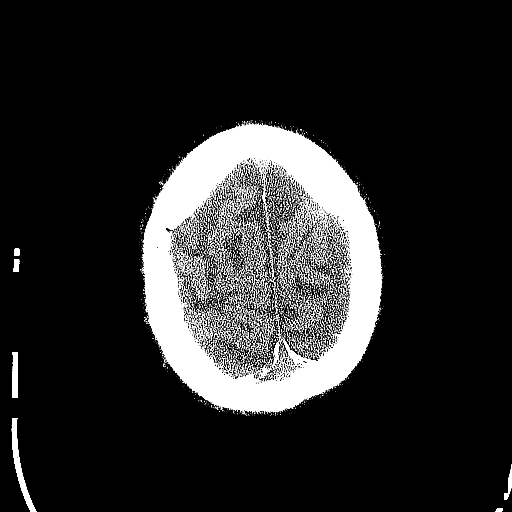
[im 83/92  brain]
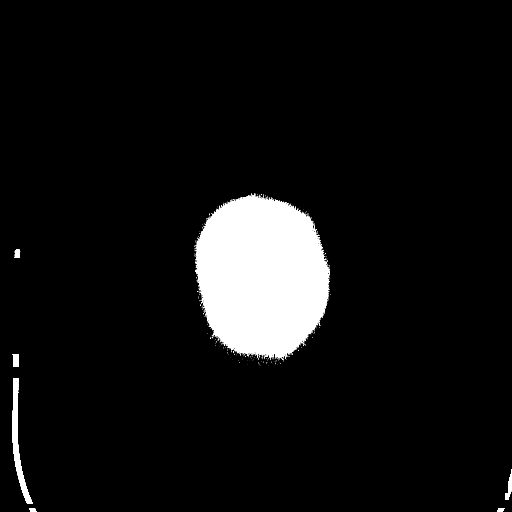
[im 83/92  bone]
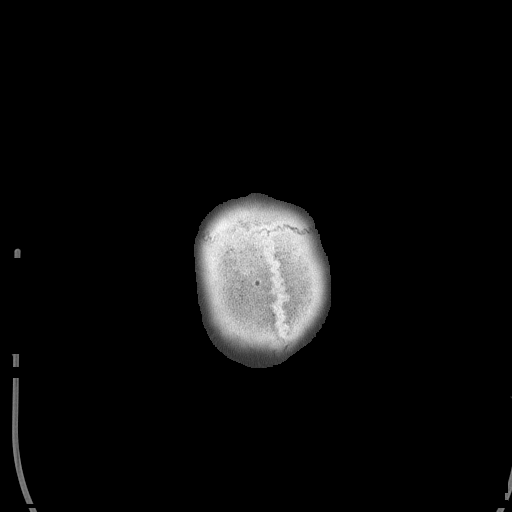

[Series 5: head 3.0 mpr cor · coronal · 0.35mm/px · 3 of 69 slices shown]
[im 23/69  brain]
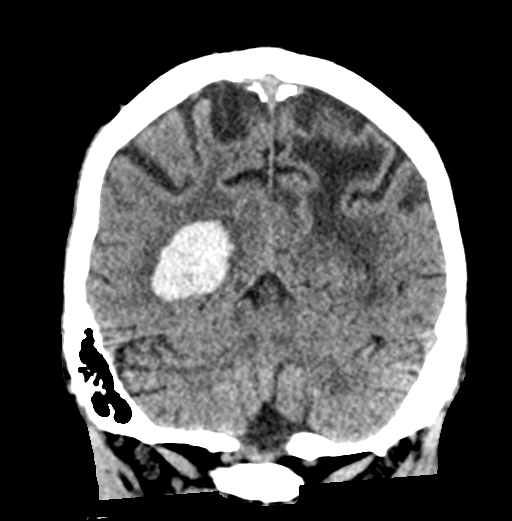
[im 31/69  brain]
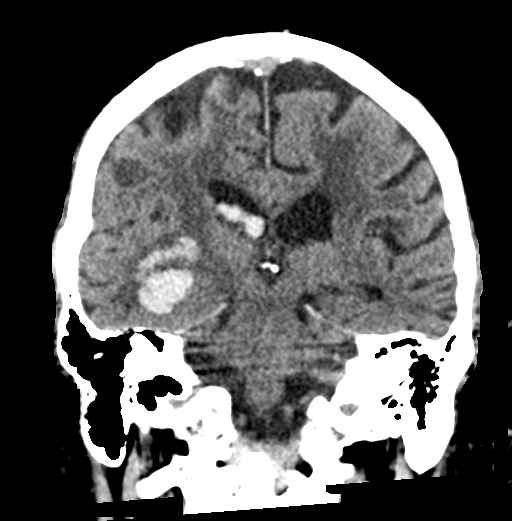
[im 38/69  brain]
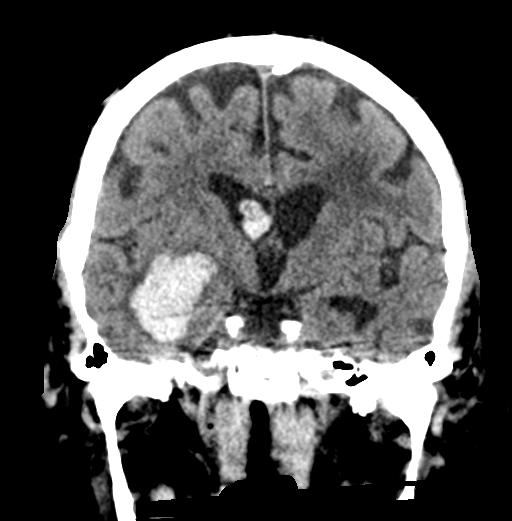

[Series 6: head 3.0 mpr sag · sagittal · 0.36mm/px · 3 of 60 slices shown]
[im 20/60  brain]
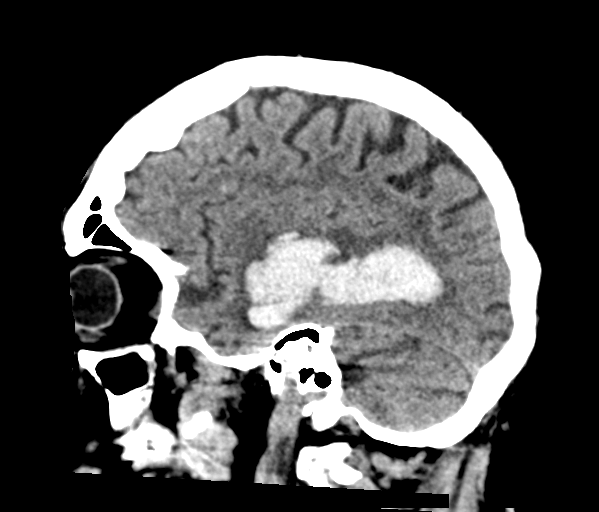
[im 30/60  brain]
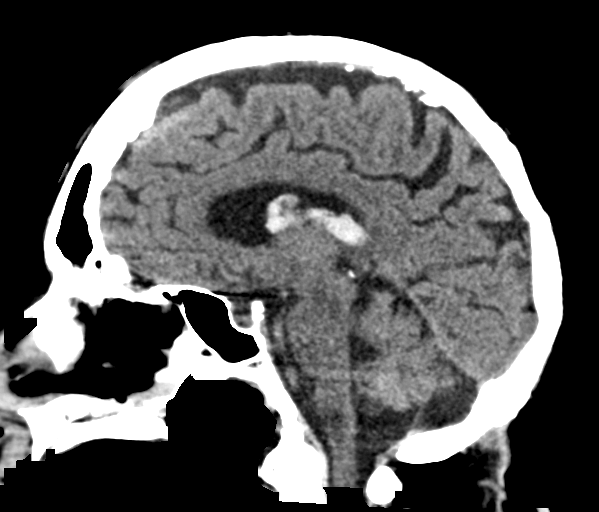
[im 40/60  brain]
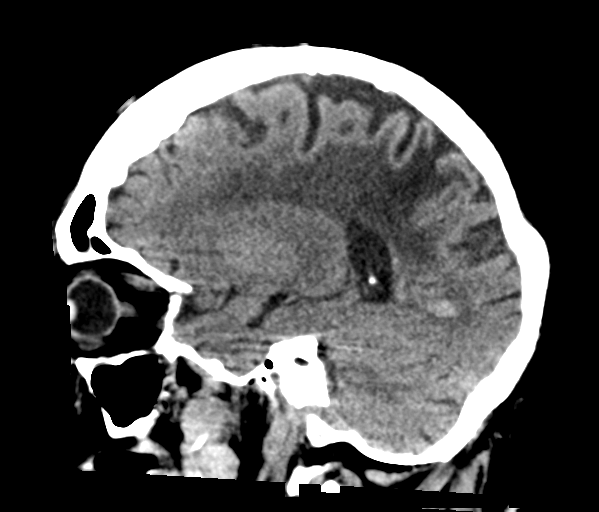

[15 of 47 positions shown; findings below may reference images not displayed]

FINDINGS: BRAIN: Evolving RIGHT inferior basal ganglia/temporal lobe
intraparenchymal hematoma with intraventricular extension with
increasing vasogenic edema. Similar RIGHT greater than LEFT lateral
ventricle dependent blood products. No hydrocephalus. 3 mm RIGHT to
LEFT midline shift. Dense RIGHT frontal, RIGHT temporal and RIGHT
parietal temporal lobe micro hemorrhages. LEFT parietal lobe and
RIGHT parietoccipital lobe encephalomalacia. Patchy to confluent
supratentorial white matter hypodensities. Old LEFT basal ganglia
infarct. Old small cerebellar infarcts. 2 mm parafalcine acute
subdural hematoma similar to decreased from prior CT. Basal cisterns
are patent.

VASCULAR: Moderate calcific atherosclerosis of the carotid siphons.

SKULL: No skull fracture. No significant scalp soft tissue swelling.

SINUSES/ORBITS: The mastoid air-cells and included paranasal sinuses
are well-aerated.The included ocular globes and orbital contents are
non-suspicious.

OTHER: None.
IMPRESSION: 1. Evolving RIGHT basal ganglia/temporal lobe hemorrhage with
intraventricular extension. Similar 3 mm RIGHT to LEFT midline
shift. No hydrocephalus.
2. Acute trace parafalcine subdural hematoma. Evolving acute RIGHT
cerebral micro hemorrhages seen with amyloid angiopathy.
3. Multiple old large vascular territory and small vessel infarcts.

## 2019-01-03 IMAGING — DX DG ABD PORTABLE 1V
2 series · 2 of 2 positions shown · non-contrast
Comparison: None.

CLINICAL DATA: encounter for feeding tube placement

EXAM:
PORTABLE ABDOMEN - 1 VIEW

[abdomen kub (1 of 2)]
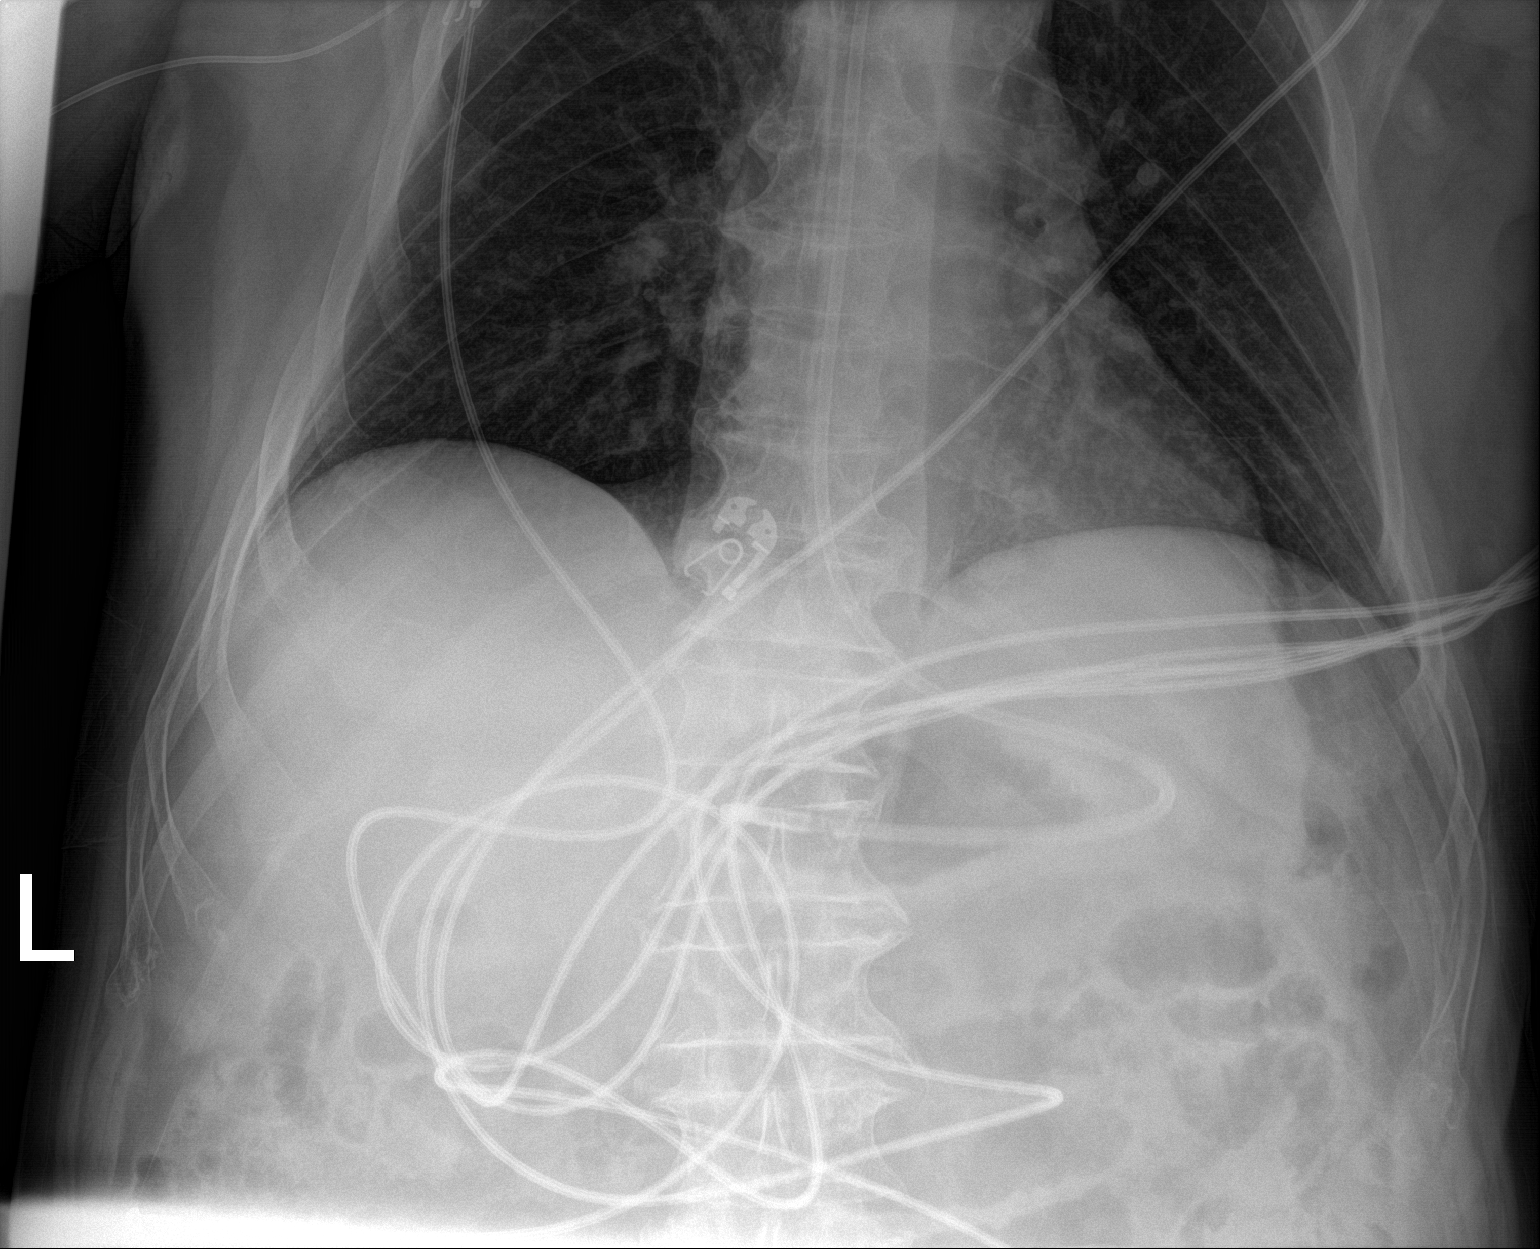

[abdomen kub (2 of 2)]
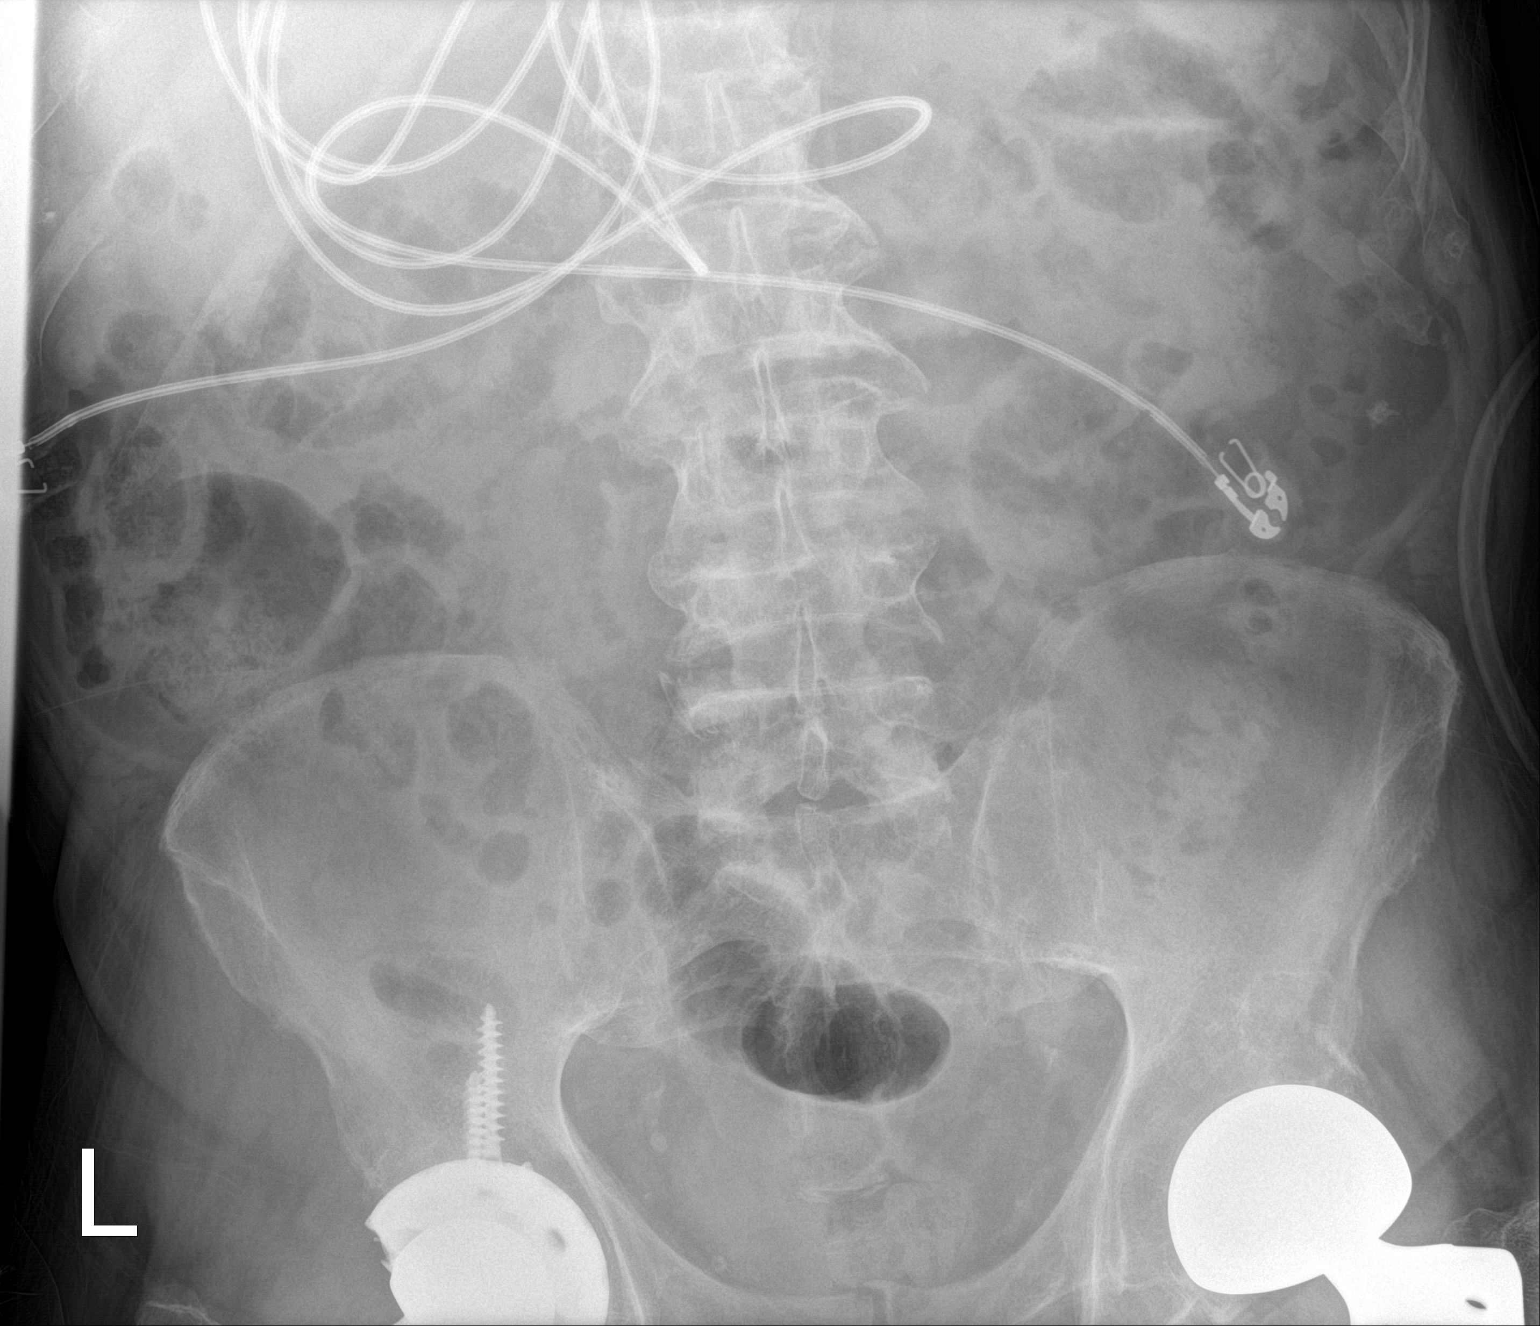

[2 of 2 positions shown; findings below may reference images not displayed]

FINDINGS: Two views of the abdomen and pelvis. Feeding tube terminates at the
distal stomach. Numerous leads and wires project over the lower
chest and upper abdomen. Non-obstructive bowel gas pattern.
Bilateral hip arthroplasty.
IMPRESSION: Feeding tube terminating at the distal stomach.

## 2019-01-05 IMAGING — DX DG CHEST 1V PORT
1 series · 1 of 1 positions shown · non-contrast
Comparison: 08/18/2017

CLINICAL DATA: Leukocytosis

EXAM:
PORTABLE CHEST 1 VIEW

[chest]
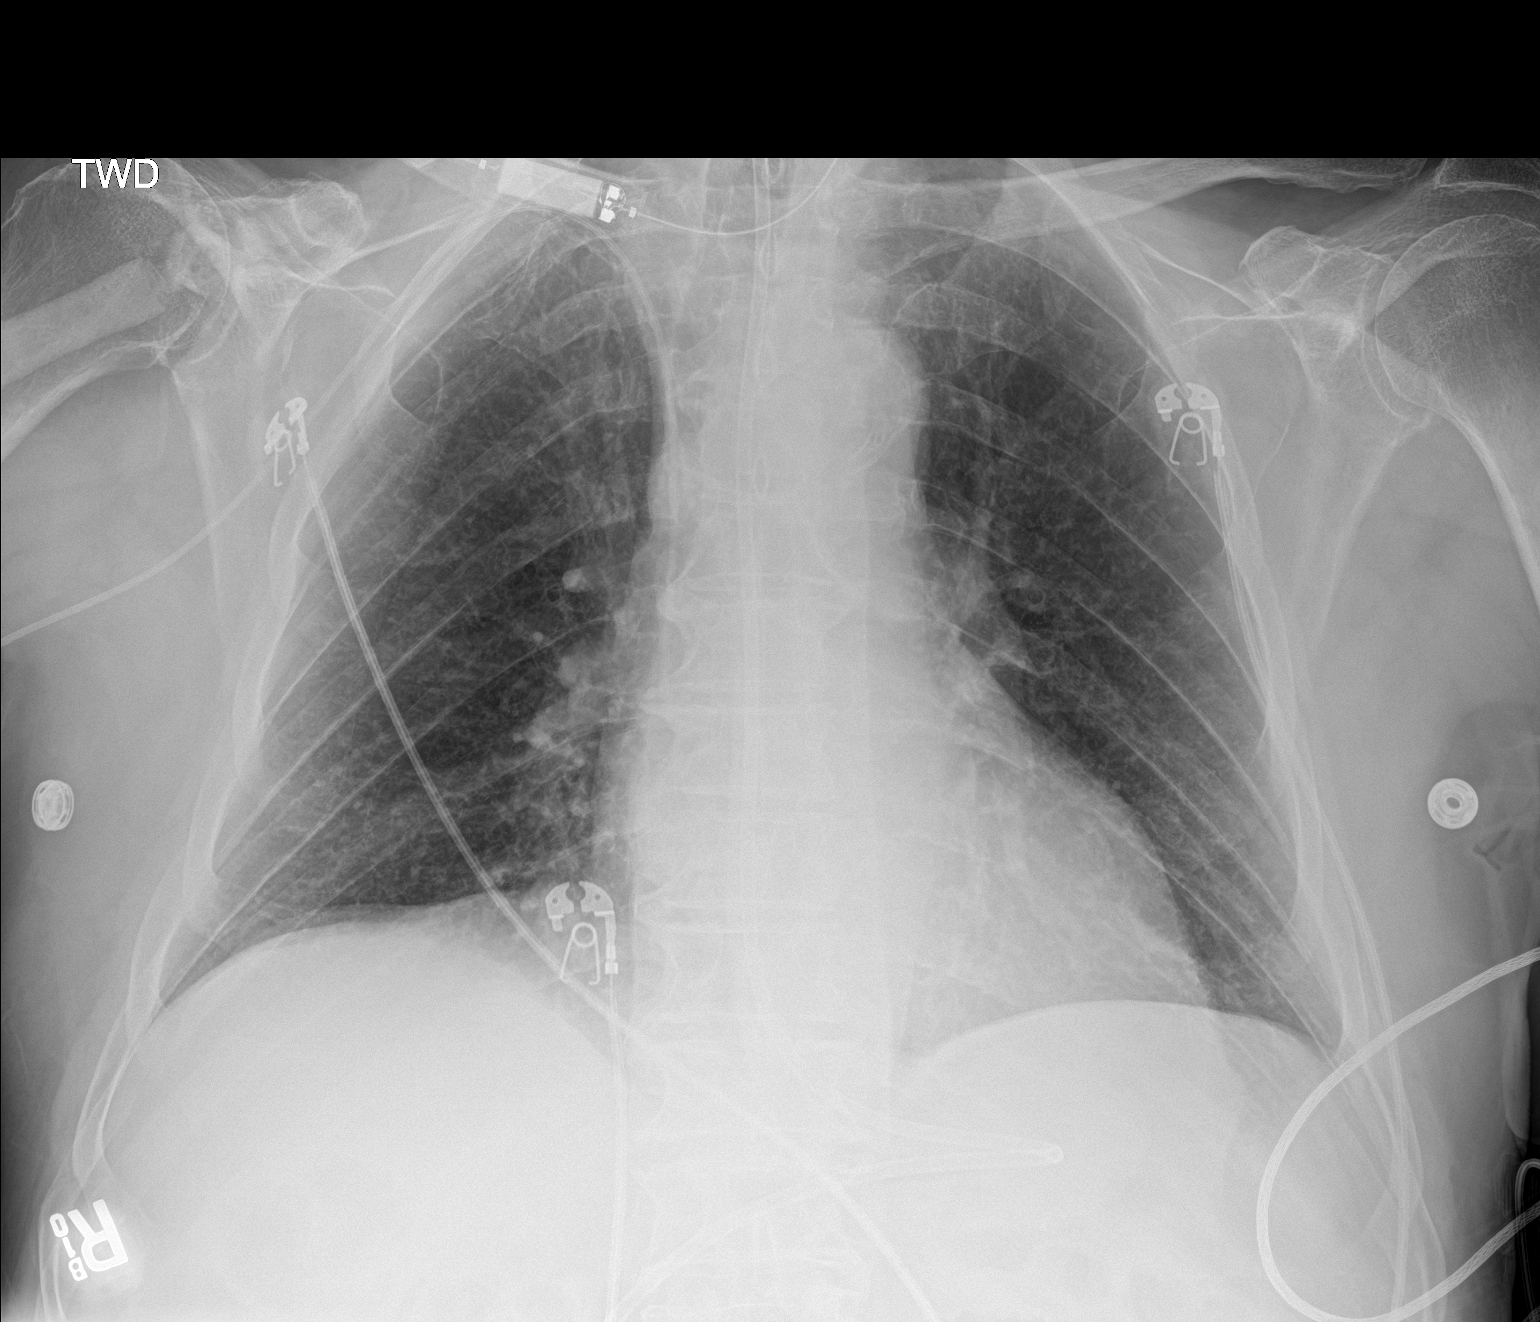

[1 of 1 positions shown; findings below may reference images not displayed]

FINDINGS: There is a right-sided PICC line with the tip projecting over the
cavoatrial junction. There is a feeding tube coursing below the
diaphragm.

There is no focal parenchymal opacity. There is no pleural effusion
or pneumothorax. The heart and mediastinal contours are
unremarkable.

The osseous structures are unremarkable.
IMPRESSION: No active disease.
